# Patient Record
Sex: Female | Born: 1955 | Race: Black or African American | Hispanic: No | Marital: Single | Smoking: Former smoker
Health system: Southern US, Community
[De-identification: ages and names within clinical notes are randomized; demographics above are authoritative.]

## PROBLEM LIST (undated history)

## (undated) DIAGNOSIS — M1710 Unilateral primary osteoarthritis, unspecified knee: Secondary | ICD-10-CM

## (undated) DIAGNOSIS — J690 Pneumonitis due to inhalation of food and vomit: Secondary | ICD-10-CM

## (undated) DIAGNOSIS — IMO0002 Reserved for concepts with insufficient information to code with codable children: Secondary | ICD-10-CM

## (undated) DIAGNOSIS — I10 Essential (primary) hypertension: Secondary | ICD-10-CM

## (undated) DIAGNOSIS — J45909 Unspecified asthma, uncomplicated: Secondary | ICD-10-CM

## (undated) HISTORY — DX: Reserved for concepts with insufficient information to code with codable children: IMO0002

## (undated) HISTORY — PX: TONSILLECTOMY: SUR1361

## (undated) HISTORY — DX: Unspecified asthma, uncomplicated: J45.909

## (undated) HISTORY — DX: Pneumonitis due to inhalation of food and vomit: J69.0

## (undated) HISTORY — DX: Essential (primary) hypertension: I10

## (undated) HISTORY — PX: PROSTATECTOMY: SHX69

## (undated) HISTORY — DX: Unilateral primary osteoarthritis, unspecified knee: M17.10

## (undated) HISTORY — PX: JOINT REPLACEMENT: SHX530

---

## 1988-03-28 HISTORY — PX: OTHER SURGICAL HISTORY: SHX169

## 2010-04-28 ENCOUNTER — Encounter: Payer: Self-pay | Admitting: Internal Medicine

## 2010-04-28 ENCOUNTER — Ambulatory Visit (INDEPENDENT_AMBULATORY_CARE_PROVIDER_SITE_OTHER): Payer: BC Managed Care – PPO | Admitting: Internal Medicine

## 2010-04-28 DIAGNOSIS — J45909 Unspecified asthma, uncomplicated: Secondary | ICD-10-CM

## 2010-04-28 DIAGNOSIS — I10 Essential (primary) hypertension: Secondary | ICD-10-CM | POA: Insufficient documentation

## 2010-04-28 DIAGNOSIS — M1710 Unilateral primary osteoarthritis, unspecified knee: Secondary | ICD-10-CM | POA: Insufficient documentation

## 2010-04-28 DIAGNOSIS — J159 Unspecified bacterial pneumonia: Secondary | ICD-10-CM | POA: Insufficient documentation

## 2010-04-28 DIAGNOSIS — IMO0002 Reserved for concepts with insufficient information to code with codable children: Secondary | ICD-10-CM | POA: Insufficient documentation

## 2010-05-05 NOTE — Assessment & Plan Note (Signed)
Summary: NEW PT/BCBS/#/LB   Vital Signs:  Patient profile:   55 year old female Height:      73 inches Weight:      284 pounds BMI:     37.60 O2 Sat:      93 % on Room air Temp:     98.6 degrees F oral Pulse rate:   72 / minute BP sitting:   122 / 72  (left arm) Cuff size:   large  Vitals Entered By: Ami Bullins CMA (April 28, 2010 10:34 AM)  O2 Flow:  Room air  Vision Screening:      Vision Comments: Normal eye exam about 2 months ago  Vision Entered By: Bill Salinas CMA (April 28, 2010 10:44 AM)   Primary Care Provider:  Jacques Navy MD   History of Present Illness: Patient presents to establish for continuity care. She does have a miniscus tear left knee by MRI. She does have pain in the right knee as well.  By her report she has been researching various treatements including arthroplasty, synvsc injections, etc. She is generally feeling well otherwise.  Last gyn exam - 2 years; last mammogram 2+ years ago. No colonoscopy  Preventive Screening-Counseling & Management  Alcohol-Tobacco     Alcohol drinks/day: <1     Alcohol type: beer, wine     Smoking Status: quit     Year Quit: 1980s  Caffeine-Diet-Exercise     Caffeine use/day: on occasions     Does Patient Exercise: yes     Type of exercise: walking     Times/week: 5  Hep-HIV-STD-Contraception     Dental Visit-last 6 months no     Sun Exposure-Excessive: no  Safety-Violence-Falls     Seat Belt Use: yes     Helmet Use: n/a     Firearms in the Home: firearms in the home     Smoke Detectors: yes     Violence in the Home: no risk noted     Sexual Abuse: no     Fall Risk: low fall risk  Current Medications (verified): 1)  Foradil Aerolizer 12 Mcg Caps (Formoterol Fumarate) 2)  Ventolin Hfa 108 (90 Base) Mcg/act Aers (Albuterol Sulfate) 3)  Carvedilol 25 Mg Tabs (Carvedilol) .... Two Times A Day 4)  Aspirin 81 Mg Tbec (Aspirin) .Marland Kitchen.. 1 Tablet Daily 5)  Losartan Potassium 100 Mg Tabs (Losartan  Potassium) .Marland Kitchen.. 1 Tablet Once Daily 6)  Ibuprofen 800 Mg Tabs (Ibuprofen) .... As Needed 7)  Fish Oil 1000 Mg Caps (Omega-3 Fatty Acids) .Marland Kitchen.. 1 Once Daily 8)  Vitamin B-12 100 Mcg Tabs (Cyanocobalamin) .Marland Kitchen.. 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  The patient denies anorexia, fever, weight gain, vision loss, decreased hearing, hoarseness, chest pain, syncope, dyspnea on exertion, peripheral edema, prolonged cough, headaches, abdominal pain, melena, hematochezia, incontinence, muscle weakness, transient blindness, difficulty walking, unusual weight change, abnormal bleeding, enlarged lymph nodes, and angioedema.         planned loss of weight 30lbs over last 8 months.  Physical Exam  General:  Overweight AA woman in no distress Head:  normocephalic, atraumatic, and no abnormalities observed.   Eyes:  vision grossly intact, pupils equal, and pupils round. Lens OD with minimal opacity lower field. Fundi normal bilaterally without AV nicking, exudates. Nl disc margins  Ears:  External ear exam shows no significant lesions or deformities.  Otoscopic examination  reveals clear canals, tympanic membranes are intact bilaterally without bulging, retraction, inflammation or discharge. Hearing is grossly normal bilaterally. Nose:  no external deformity and no external erythema.   Mouth:  missing several molars,premolars, staining noted. No obvious gingevitis. Posterior pharynx clear. Neck:  supple, no masses, no thyromegaly, and no carotid bruits.   Chest Wall:  no deformities.   Breasts:  deferred Lungs:  normal respiratory effort, normal breath sounds, no crackles, and no wheezes.   Heart:  normal rate, regular rhythm, no murmur, and no JVD.   Abdomen:  soft, non-tender, and normal bowel sounds.   Genitalia:  deferred Msk:  normal ROM, no joint swelling, no joint warmth, no redness over joints, and no joint instability.   Pulses:  2+ radial Extremities:  No clubbing, cyanosis, edema, or deformity noted with normal full range of motion of all joints.   Neurologic:  alert & oriented X3, cranial nerves II-XII intact, gait normal, and DTRs symmetrical and normal.   Skin:  turgor normal, color normal, and no suspicious lesions.   Cervical Nodes:  no anterior cervical adenopathy and no posterior cervical adenopathy.   Psych:  Oriented X3, memory intact for recent and remote, normally interactive, and good eye contact.     Impression & Recommendations:  Problem # 1:  UNSPECIFIED ESSENTIAL HYPERTENSION (ICD-401.9)  Her updated medication list for this problem includes:    Carvedilol 25 Mg Tabs (Carvedilol) .Marland Kitchen..Marland Kitchen Two times a day    Losartan Potassium 100 Mg Tabs (Losartan potassium) .Marland Kitchen... 1 tablet once daily  BP today: 122/72  Good control on present medications. No lab available.  Plan - continue present meds  Problem # 2:  OSTEOARTHRITIS, KNEES, BILATERAL (ICD-715.96) Mild enlargement of left knee, no definite effusion. Patient reports that she has a click and that her knee does give way. Right knee with discomfort  Plan - continue with NSAIDs as  needed           May benefit from hyaluronidase injections.           With knee giving way may benefit from arthroscopic repair of patient reported torn meniscus.  Her updated medication list for this problem includes:    Aspirin 81 Mg Tbec (Aspirin) .Marland Kitchen... 1 tablet daily    Ibuprofen 800 Mg Tabs (Ibuprofen) .Marland Kitchen... As needed  Problem # 3:  ASTHMA (ICD-493.90) Patient with a long h/o asthma doing well on long acting bronchodilator and accolate with need for breakthrough bronchodilator less than once a week.   Plan - continue present regimen           Full PFTs in the future to establish baseline function.  Her updated medication list for this problem includes:    Foradil Aerolizer 12  Mcg Caps (Formoterol fumarate)    Ventolin Hfa 108 (90 Base) Mcg/act Aers (Albuterol sulfate)  Problem # 4:  Preventive Health Care (ICD-V70.0) By history she seems stable. Limited exam OK. Ms. Bracken will obtain labs done in the past 12 months from the [place]. She is due for breast exam, mammogram and pelvic with PAP. She is informed that these services are available here except for mammography. She will obtain records of last colon exam.   Ms. Lookabaugh is working on weight reduction by smart food choices, and reduced calorie intake. She has lost 30lbs in the last year. She is encouraged to continue on this Program.  She is oriented to the practice and hours of operation. She will return in 4-6 weeks for follow-up and completion of any wellness services that are due.   Complete Medication List: 1)  Foradil Aerolizer 12 Mcg Caps (Formoterol fumarate) 2)  Ventolin Hfa 108 (90 Base) Mcg/act Aers (Albuterol sulfate) 3)  Carvedilol 25 Mg Tabs (Carvedilol) .... Two times a day 4)  Aspirin 81 Mg Tbec (Aspirin) .Marland Kitchen.. 1 tablet daily 5)  Losartan Potassium 100 Mg Tabs (Losartan potassium) .Marland Kitchen.. 1 tablet once daily 6)  Ibuprofen 800 Mg Tabs (Ibuprofen) .... As needed 7)  Fish Oil 1000 Mg Caps (Omega-3 fatty acids) .Marland Kitchen.. 1 once  daily 8)  Vitamin B-12 100 Mcg Tabs (Cyanocobalamin) .Marland Kitchen.. 1 tablet once daily   Orders Added: 1)  New Patient Level III [99203]   Immunization History:  Influenza Immunization History:    Influenza:  historical (12/30/2009)   Immunization History:  Influenza Immunization History:    Influenza:  Historical (12/30/2009)

## 2010-05-27 ENCOUNTER — Ambulatory Visit: Payer: BC Managed Care – PPO | Admitting: Internal Medicine

## 2010-05-28 ENCOUNTER — Telehealth: Payer: Self-pay | Admitting: Internal Medicine

## 2010-05-31 ENCOUNTER — Encounter: Payer: Self-pay | Admitting: Internal Medicine

## 2010-05-31 ENCOUNTER — Ambulatory Visit: Payer: BC Managed Care – PPO | Admitting: Internal Medicine

## 2010-06-02 ENCOUNTER — Ambulatory Visit (INDEPENDENT_AMBULATORY_CARE_PROVIDER_SITE_OTHER): Payer: BC Managed Care – PPO | Admitting: Internal Medicine

## 2010-06-02 ENCOUNTER — Other Ambulatory Visit (HOSPITAL_COMMUNITY)
Admission: RE | Admit: 2010-06-02 | Discharge: 2010-06-02 | Disposition: A | Discharge status: Home / Self Care | Payer: BC Managed Care – PPO | Source: Ambulatory Visit | Attending: Internal Medicine | Admitting: Internal Medicine

## 2010-06-02 ENCOUNTER — Encounter: Payer: Self-pay | Admitting: Internal Medicine

## 2010-06-02 ENCOUNTER — Other Ambulatory Visit: Payer: Self-pay | Admitting: Internal Medicine

## 2010-06-02 DIAGNOSIS — Z01419 Encounter for gynecological examination (general) (routine) without abnormal findings: Secondary | ICD-10-CM | POA: Insufficient documentation

## 2010-06-02 DIAGNOSIS — Z0000 Encounter for general adult medical examination without abnormal findings: Secondary | ICD-10-CM

## 2010-06-03 NOTE — Progress Notes (Signed)
Summary: refill  Phone Note Call from Patient Call back at Home Phone [phone]   Caller: Patient Reason for Call: Refill Medication Summary of Call: request refill on hydrocodone and hydrochlorothiazide, [facility] [address], also request handicapped sticker filled out, given to [facility] Initial call taken by: Migdalia Dk,  May 28, 2010 2:18 PM  Follow-up for Phone Call        Please Advise refill on Hydrocodone Follow-up by: Ami Bullins CMA,  May 28, 2010 3:48 PM  Additional Follow-up for Phone Call Additional follow up Details #1::        called patient: she has been taking hydorcodone/APAP 5/500 two times a day as needed and HCTZ 25 once daily and forgot to tell us. Added to med list and called in refills Additional Follow-up by: Jacques Navy MD,  May 28, 2010 6:22 PM    New/Updated Medications: NORCO 5-325 MG TABS (HYDROCODONE-ACETAMINOPHEN) 1 by mouth two times a day as needed HYDROCHLOROTHIAZIDE 25 MG TABS (HYDROCHLOROTHIAZIDE) 1po once daily Prescriptions: HYDROCHLOROTHIAZIDE 25 MG TABS (HYDROCHLOROTHIAZIDE) 1po once daily  #30 x 12   Entered and Authorized by:   Jacques Navy MD   Signed by:   Jacques Navy MD on 05/28/2010   Method used:   Telephoned to ...       [facility]  [address] [phone][facility] (retail)       [address]       [place], [place]  [id number]       Ph: [phone] or [phone]       Fax: [phone]   RxID:   [id number] NORCO 5-325 MG TABS (HYDROCODONE-ACETAMINOPHEN) 1 by mouth two times a day as needed  #60 x 2   Entered and Authorized by:   Jacques Navy MD   Signed by:   Jacques Navy MD on 05/28/2010   Method used:   Telephoned to ...       [facility]  [address] [phone]* (retail)       [address]       [place], [place]  [id number]       Ph: [phone] or [phone]       Fax: [phone]   RxID:   [id number]

## 2010-06-06 ENCOUNTER — Encounter: Payer: Self-pay | Admitting: Internal Medicine

## 2010-06-08 NOTE — Assessment & Plan Note (Signed)
Summary: 1 mos f/u --rs'd from bumped list   Vital Signs:  Patient profile:   55 year old female Height:      73 inches Weight:      289 pounds BMI:     38.27 Temp:     98.5 degrees F oral Pulse rate:   88 / minute Pulse rhythm:   regular Resp:     16 per minute BP sitting:   110 / 78  (left arm) Cuff size:   large  Vitals Entered By: Lanier Prude, CMA(AAMA) (June 02, 2010 11:18 AM) CC: f/u Is Patient Diabetic? No   Primary Care Provider:  Jacques Navy MD  CC:  f/u.  History of Present Illness: Gabriella Nguyen returns at [facility] request for new patinet follow-up.   CC: she is still having knee trouble. she did have a MRi left knee at [facility] '07 - torn meniscus - she declined arthroscopic surgery since she did better with glucosamine. She is interested in a local orthopedist - Dr. Lequita Halt.   Patient wants to loose weight and come off of medication. discussed 3 principles of weight mgt: smart food choice, portion size control  and exercise.   target too loose 30 lbs, goal 1 lb loss per month.   Per last note: for PAP and breast today.  Current Medications (verified): 1)  Foradil Aerolizer 12 Mcg Caps (Formoterol Fumarate) 2)  Ventolin Hfa 108 (90 Base) Mcg/act Aers (Albuterol Sulfate) 3)  Carvedilol 25 Mg Tabs (Carvedilol) .... Two Times A Day 4)  Aspirin 81 Mg Tbec (Aspirin) .Marland Kitchen.. 1 Tablet Daily 5)  Losartan Potassium 100 Mg Tabs (Losartan Potassium) .Marland Kitchen.. 1 Tablet Once Daily 6)  Ibuprofen 800 Mg Tabs (Ibuprofen) .... As Needed 7)  Fish Oil 1000 Mg Caps (Omega-3 Fatty Acids) .Marland Kitchen.. 1 Once Daily 8)  Vitamin B-12 100 Mcg Tabs (Cyanocobalamin) .Marland Kitchen.. 1 Tablet Once Daily 9)  Norco 5-325 Mg Tabs (Hydrocodone-Acetaminophen) .Marland Kitchen.. 1 By Mouth Two Times A Day As Needed 10)  Hydrochlorothiazide 25 Mg Tabs (Hydrochlorothiazide) .Marland Kitchen.. 1po Once Daily  Allergies (verified): No Known Drug Allergies PMH-FH-SH reviewed-no changes except otherwise noted  Review of Systems  The patient denies  anorexia, fever, weight loss, weight gain, decreased hearing, chest pain, syncope, dyspnea on exertion, abdominal pain, severe indigestion/heartburn, muscle weakness, difficulty walking, depression, abnormal bleeding, and angioedema.    Physical Exam  General:  overweight AA female in no distress Head:  normocephalic and atraumatic.   Breasts:  No mass, nodules, thickening, tenderness, bulging, retraction, inflamation, nipple discharge or skin changes noted.  There are minor fibrocystic changes Genitalia:  Pelvic Exam:        External: normal female genitalia without lesions or masses        Vagina: normal without lesions or masses        Cervix: normal without lesions or mass                      Pap smear: performed  Patient with scant white d/c c/w yeast.    Impression & Recommendations:  Problem # 1:  OSTEOARTHRITIS, KNEES, BILATERAL (ICD-715.96) Patient with on -going knee   Plan - refer to Dr. Lequita Halt  Her updated medication list for this problem includes:    Aspirin 81 Mg Tbec (Aspirin) .Marland Kitchen... 1 tablet daily    Ibuprofen 800 Mg Tabs (Ibuprofen) .Marland Kitchen... As needed    Norco 5-325 Mg Tabs (Hydrocodone-acetaminophen) .Marland Kitchen... 1 by mouth two times a day as  needed  Orders: Orthopedic Surgeon Referral (Ortho Surgeon)  Problem # 2:  UNSPECIFIED ESSENTIAL HYPERTENSION (ICD-401.9) Well controlled.  Plan - weight management and drug holiday when down 30 lbs. (259)  Her updated medication list for this problem includes:    Carvedilol 25 Mg Tabs (Carvedilol) .Marland Kitchen..Marland Kitchen Two times a day    Losartan Potassium 100 Mg Tabs (Losartan potassium) .Marland Kitchen... 1 tablet once daily    Hydrochlorothiazide 25 Mg Tabs (Hydrochlorothiazide) .Marland Kitchen... 1po once daily  Problem # 3:  Preventive Health Care (ICD-V70.0) Pelvic with PAP done today. Will schedule for mammogram. Breast exam with minor fibrocystic change. Discussed weight management.  Problem # 4:  ASTHMA (ICD-493.90) Doing well.   Plan - for PFTs  Her  updated medication list for this problem includes:    Foradil Aerolizer 12 Mcg Caps (Formoterol fumarate)    Ventolin Hfa 108 (90 Base) Mcg/act Aers (Albuterol sulfate)  Orders: Pulmonary Referral (Pulmonary)  Complete Medication List: 1)  Foradil Aerolizer 12 Mcg Caps (Formoterol fumarate) 2)  Ventolin Hfa 108 (90 Base) Mcg/act Aers (Albuterol sulfate) 3)  Carvedilol 25 Mg Tabs (Carvedilol) .... Two times a day 4)  Aspirin 81 Mg Tbec (Aspirin) .Marland Kitchen.. 1 tablet daily 5)  Losartan Potassium 100 Mg Tabs (Losartan potassium) .Marland Kitchen.. 1 tablet once daily 6)  Ibuprofen 800 Mg Tabs (Ibuprofen) .... As needed 7)  Fish Oil 1000 Mg Caps (Omega-3 fatty acids) .Marland Kitchen.. 1 once daily 8)  Vitamin B-12 100 Mcg Tabs (Cyanocobalamin) .Marland Kitchen.. 1 tablet once daily 9)  Norco 5-325 Mg Tabs (Hydrocodone-acetaminophen) .Marland Kitchen.. 1 by mouth two times a day as needed 10)  Hydrochlorothiazide 25 Mg Tabs (Hydrochlorothiazide) .Marland Kitchen.. 1po once daily 11)  Fluconazole 150 Mg Tabs (Fluconazole) .Marland Kitchen.. 1 by mouth  x 1 for vaginitis  Other Orders: Radiology Referral (Radiology) Prescriptions: FLUCONAZOLE 150 MG TABS (FLUCONAZOLE) 1 by mouth  x 1 for vaginitis  #1 x 1   Entered and Authorized by:   Gabriella Navy MD   Signed by:   Gabriella Navy MD on 06/02/2010   Method used:   [facility]        [facility]  [address] [phone]* (retail)       [address]       [place], [place]  [id number]       Ph: [phone] or [phone]       Fax: [phone]   RxID:   [id number]    Orders Added: 1)  Orthopedic Surgeon Referral Gaylord Shih Surgeon] 2)  Pulmonary Referral [Pulmonary] 3)  Radiology Referral [Radiology] 4)  Est. Patient 40-64 years [phone]

## 2010-06-08 NOTE — Letter (Signed)
Summary: [employer] / [place] [employer] / [place] [employer]   Imported By: Lennie Odor 06/02/2010 10:50:42  _____________________________________________________________________  External Attachment:    Type:   Image     Comment:   External Document

## 2010-06-10 ENCOUNTER — Encounter: Payer: Self-pay | Admitting: Internal Medicine

## 2010-06-10 ENCOUNTER — Encounter (INDEPENDENT_AMBULATORY_CARE_PROVIDER_SITE_OTHER): Payer: BC Managed Care – PPO

## 2010-06-10 DIAGNOSIS — J45909 Unspecified asthma, uncomplicated: Secondary | ICD-10-CM

## 2010-06-10 LAB — PULMONARY FUNCTION TEST

## 2010-06-15 NOTE — Letter (Signed)
   [facility] [address] [place], [place]  [address] Phone: [phone]      June 07, 2010   Gabriella Nguyen [address] [place], [place] [address]  RE:  LAB RESULTS  Dear  [name],  The following is an interpretation of your most recent lab tests.  Please take note of any instructions provided or changes to medications that have resulted from your lab work.  Pap Smear: normal   Sincerely Yours,    Jacques Navy MD

## 2010-06-15 NOTE — Assessment & Plan Note (Signed)
Summary: pft charges   Allergies: No Known Drug Allergies   Other Orders: Carbon Monoxide diffusing w/capacity (94729) Lung Volumes/Gas dilution or washout (94727) Spirometry (Pre & Post) (94060) 

## 2010-07-21 ENCOUNTER — Telehealth: Payer: Self-pay | Admitting: Internal Medicine

## 2010-07-21 NOTE — Telephone Encounter (Signed)
Regret this wasn't brought to my attn this AM. If she calls she can take otc bonine - mild antihistamine will help with her nerves going over the long bridge.

## 2010-07-21 NOTE — Telephone Encounter (Signed)
Left mess to call office back.  What about pain patch req for her knees?

## 2010-07-21 NOTE — Telephone Encounter (Signed)
Pt requests something to calm her down,leaving for [facility], the bridge she will be going over is very very long and she is afraid of bridges. Pt leaving this am and is aware of short notice. Also waiting for orthopedic (appt is set up). Pt would also like a pain patch for her knees.

## 2010-07-22 NOTE — Telephone Encounter (Signed)
Should call ortho for treatment of choice, i.e. volatren gel, etc.

## 2010-07-22 NOTE — Telephone Encounter (Signed)
Patient informed - She has not gotten in with ortho yet. Currently taking ibuprofen (w/o stomach upset) so I advised she try 2 aleve bid instead of Advil and watch for GI irritation. She will call back w/any further problems.

## 2010-08-25 NOTE — Telephone Encounter (Signed)
Done by CMA

## 2010-09-02 ENCOUNTER — Other Ambulatory Visit: Payer: Self-pay | Admitting: *Deleted

## 2010-09-02 MED ORDER — ALBUTEROL 90 MCG/ACT IN AERS: 2.0000 | INHALATION_SPRAY | Freq: Four times a day (QID) | RESPIRATORY_TRACT | Status: DC | PRN

## 2010-09-02 NOTE — Progress Notes (Signed)
Rf requested, done, pt aware

## 2010-09-10 ENCOUNTER — Encounter: Payer: Self-pay | Admitting: Internal Medicine

## 2010-11-08 ENCOUNTER — Telehealth: Payer: Self-pay | Admitting: *Deleted

## 2010-11-08 NOTE — Telephone Encounter (Signed)
Patient left VM to transfer an rx, unsure med or pharm.

## 2010-11-19 ENCOUNTER — Telehealth: Payer: Self-pay | Admitting: *Deleted

## 2010-11-19 MED ORDER — FORMOTEROL FUMARATE 12 MCG IN CAPS: 12.0000 ug | ORAL_CAPSULE | Freq: Two times a day (BID) | RESPIRATORY_TRACT | Status: DC

## 2010-11-19 MED ORDER — LOSARTAN POTASSIUM 100 MG PO TABS: 100.0000 mg | ORAL_TABLET | Freq: Every day | ORAL | Status: DC

## 2010-11-19 MED ORDER — CARVEDILOL 25 MG PO TABS: 25.0000 mg | ORAL_TABLET | Freq: Two times a day (BID) | ORAL | Status: DC

## 2010-11-19 NOTE — Telephone Encounter (Signed)
Needs RFs, Done

## 2011-01-31 ENCOUNTER — Other Ambulatory Visit: Payer: Self-pay | Admitting: *Deleted

## 2011-01-31 NOTE — Telephone Encounter (Signed)
Pt states she was seen by her dentist 2 weeks ago for tooth abscess and was given amoxicillin.  Pt now has yeast infection and wants to know if we can call in Diflucan for her.  Pt has had in the past. Also pt needs refill on generic Accolate (zafirlukast).  We have never filled this for patient.  She is transferring prescriptions from the [place] and needs new RX. RC to pt to verify dose.  Message left to return call.

## 2011-02-01 MED ORDER — FLUCONAZOLE 150 MG PO TABS: 150.0000 mg | ORAL_TABLET | Freq: Once | ORAL | Status: AC

## 2011-02-01 MED ORDER — ZAFIRLUKAST 20 MG PO TABS: 20.0000 mg | ORAL_TABLET | Freq: Two times a day (BID) | ORAL | Status: DC

## 2011-02-01 NOTE — Telephone Encounter (Signed)
Rx Done. Patient Informed.

## 2011-02-01 NOTE — Telephone Encounter (Signed)
It is ok for diflucan 150mg  x 1 dose. OK to refill accolate

## 2011-02-01 NOTE — Telephone Encounter (Signed)
See note below from 11.05.12

## 2011-04-28 ENCOUNTER — Telehealth: Payer: Self-pay | Admitting: *Deleted

## 2011-04-28 NOTE — Telephone Encounter (Signed)
Pt says every now and then have trouble breathing.  Will use albuterol inhaler but sometimes do not work right away.--Ph# [phone]

## 2011-04-28 NOTE — Telephone Encounter (Signed)
Last OV March '12.  Albuterol is supposed to work quickly to relieve acute wheezing.  She should be taking foradil inhaler as directed every day to keep asthma, and wheezing, under control.

## 2011-04-28 NOTE — Telephone Encounter (Signed)
Pt requested callback regarding asthma medication. Left message for pt to callback office.

## 2011-04-29 ENCOUNTER — Ambulatory Visit (INDEPENDENT_AMBULATORY_CARE_PROVIDER_SITE_OTHER): Payer: BC Managed Care – PPO | Admitting: Internal Medicine

## 2011-04-29 ENCOUNTER — Encounter: Payer: Self-pay | Admitting: Internal Medicine

## 2011-04-29 VITALS — BP 120/72 | HR 69 | Temp 97.5°F | Wt 284.8 lb

## 2011-04-29 DIAGNOSIS — J45901 Unspecified asthma with (acute) exacerbation: Secondary | ICD-10-CM

## 2011-04-29 MED ORDER — PREDNISONE (PAK) 10 MG PO TABS: 10.0000 mg | ORAL_TABLET | ORAL | Status: AC

## 2011-04-29 NOTE — Telephone Encounter (Signed)
Called patient who advised that she was seen by Dr Felicity Coyer today

## 2011-04-29 NOTE — Progress Notes (Signed)
  Subjective:    Patient ID: Gabriella Nguyen, female    DOB: 15-Apr-1955, 55 y.o.   MRN: [id number]  HPI  complains of shortness of breath Onset 1.5 week ago associated with wheezing and chest tightness, dry cough Precipitated by URI but reports "head cold symptoms are gone" Ran out of fordil inhaler 2 days ago - symptoms worse and not controlled with rescue inhaler alone PMH significant for asthma  Past Medical History  Diagnosis Date  . ASTHMA   . OSTEOARTHRITIS, KNEES, BILATERAL   . Unspecified essential hypertension     Review of Systems  Constitutional: Negative for fever and fatigue.  Respiratory: Positive for chest tightness, shortness of breath and wheezing.   Cardiovascular: Negative for chest pain, palpitations and leg swelling.       Objective:   Physical Exam BP 120/72  Pulse 69  Temp(Src) 97.5 F (36.4 C) (Oral)  Wt 284 lb 12.8 oz (129.184 kg)  SpO2 93% Wt Readings from Last 3 Encounters:  04/29/11 284 lb 12.8 oz (129.184 kg)  06/02/10 289 lb (131.09 kg)  04/28/10 284 lb (128.822 kg)   Constitutional: She appears well-developed and well-nourished. No distress but mild increase WOB at rest before neb.  Neck: Normal range of motion. Neck supple. No JVD present. No thyromegaly present.  Cardiovascular: Normal rate, regular rhythm and normal heart sounds.  No murmur heard. No BLE edema. Pulmonary/Chest: Effort normal at rest - breath sounds with B exp wheezes, no crackle or rhonchi.  Psychiatric: She has a normal mood and affect. Her behavior is normal. Judgment and thought content normal.   No results found for this basename: WBC, HGB, HCT, PLT, GLUCOSE, CHOL, TRIG, HDL, LDLDIRECT, LDLCALC, ALT, AST, NA, K, CL, CREATININE, BUN, CO2, TSH, PSA, INR, GLUF, HGBA1C, MICROALBUR       Assessment & Plan:  Acute asthma exac - precipitated by recent URI (resolved) and exac by no inhaled steroids -  Tx: Alb neb now pred pak x 6 days Hold antibiotics refill long  acting B agonist and consider steroid inhaler Continue rescue inhaler prn

## 2011-04-29 NOTE — Patient Instructions (Signed)
It was good to see you today. Prednisone taper x 6 dasy for asthma symptoms - Your prescription(s) have been submitted to your pharmacy. Please take as directed and contact our office if you believe you are having problem(s) with the medication(s). If you develop worsening symptoms or fever, call and we can reconsider antibiotics, but it does not appear necessary to use antibiotics at this time. Use daily steroid inhaler everyday =- also additional  Albuterol rescue inhaler if symptoms uncontrolled

## 2011-05-02 ENCOUNTER — Telehealth: Payer: Self-pay

## 2011-05-02 NOTE — Telephone Encounter (Signed)
[facility] Triage Call Report Triage Record Num: [id number] Operator: Fernand Parkins Patient Name: Gabriella Nguyen Call Date & Time: 04/29/2011 6:53:57PM Patient Phone: [phone] PCP: Rene Paci Patient Gender: Female PCP Fax : [phone] Patient DOB: 08-17-1955 [facility] Name: [facility] Reason for Call: Caller: Gabriella Nguyen/Patient; PCP: Rene Paci; CB#: [phone]; Call Reason: ; Sx Onset: ; Sx Notes: ; Afebrile; Wt: ; Guideline Used: ; Disp:; Appt Scheduled?: LMP 6-7 yrs ago. Afebrile. Gabriella Nguyen/pt calling regarding needs to clarify how she needs to take the Prednisone? Was told by the pcp that she could just take all the pills in one dose for each day and step down on the dose everyday. Was seen in pcp office today and started on Prednisone. S/w on call MD Berniece Andreas, was advised could take all pills at once for each days dose. Advised pt if needs any further assistance to call the pcp office back. Protocol(s) Used: Medication Questions - Adult Recommended Outcome per Protocol: Provided Health Information Override Outcome if Used in Protocol: Call Provider Immediately RN Reason for Override Outcome: Office Is Closed. Reason for Outcome: Caller has medication question(s) that was answered with available resources Care Advice: ~ 04/29/2011 7:12:24PM Page 1 of 1 [email or web address]

## 2011-05-18 ENCOUNTER — Other Ambulatory Visit: Payer: Self-pay | Admitting: Internal Medicine

## 2011-05-25 ENCOUNTER — Other Ambulatory Visit: Payer: Self-pay | Admitting: Internal Medicine

## 2011-06-20 ENCOUNTER — Other Ambulatory Visit: Payer: Self-pay | Admitting: Internal Medicine

## 2011-07-08 ENCOUNTER — Other Ambulatory Visit: Payer: Self-pay | Admitting: Internal Medicine

## 2011-07-08 NOTE — Telephone Encounter (Signed)
Done

## 2011-07-22 ENCOUNTER — Encounter: Payer: Self-pay | Admitting: Endocrinology

## 2011-07-22 ENCOUNTER — Ambulatory Visit (INDEPENDENT_AMBULATORY_CARE_PROVIDER_SITE_OTHER): Payer: BC Managed Care – PPO | Admitting: Endocrinology

## 2011-07-22 VITALS — BP 112/76 | HR 108 | Temp 101.2°F | Ht 73.0 in | Wt 272.0 lb

## 2011-07-22 DIAGNOSIS — K529 Noninfective gastroenteritis and colitis, unspecified: Secondary | ICD-10-CM

## 2011-07-22 DIAGNOSIS — K5289 Other specified noninfective gastroenteritis and colitis: Secondary | ICD-10-CM

## 2011-07-22 MED ORDER — ONDANSETRON HCL 4 MG PO TABS: 4.0000 mg | ORAL_TABLET | Freq: Three times a day (TID) | ORAL | Status: AC | PRN

## 2011-07-22 NOTE — Progress Notes (Signed)
  Subjective:    Patient ID: Gabriella Nguyen, female    DOB: [birth year], 56 y.o.   MRN: [id number]  HPI Pt states 2 days of moderate myalgias throughout the body, and assoc n/v. Past Medical History  Diagnosis Date  . ASTHMA   . OSTEOARTHRITIS, KNEES, BILATERAL   . Unspecified essential hypertension     No past surgical history on file.  History   Social History  . Marital Status: Single    Spouse Name: N/A    Number of Children: N/A  . Years of Education: N/A   Occupational History  . Not on file.   Social History Main Topics  . Smoking status: Former Games developer  . Smokeless tobacco: Not on file  . Alcohol Use: Not on file  . Drug Use: Not on file  . Sexually Active: Not on file   Other Topics Concern  . Not on file   Social History Narrative  . No narrative on file    Current Outpatient Prescriptions on File Prior to Visit  Medication Sig Dispense Refill  . albuterol (PROVENTIL,VENTOLIN) 90 MCG/ACT inhaler Inhale 2 puffs into the lungs every 6 (six) hours as needed for wheezing.  17 g  5  . carvedilol (COREG) 25 MG tablet TAKE 1 TABLET (25 MG TOTAL) BY MOUTH 2 (TWO) TIMES DAILY.  180 tablet  1  . FORADIL AEROLIZER 12 MCG capsule for inhaler PLACE 1 CAPSULE (12 MCG TOTAL) INTO INHALER AND INHALE 2 (TWO) TIMES DAILY.  1 each  0  . hydrochlorothiazide (HYDRODIURIL) 25 MG tablet TAKE 1 TABLET BY MOUTH DAILY  30 tablet  1  . losartan (COZAAR) 100 MG tablet TAKE 1 TABLET (100 MG TOTAL) BY MOUTH DAILY.  90 tablet  1  . zafirlukast (ACCOLATE) 20 MG tablet TAKE 1 TABLET BY MOUTH TWICE DAILY.  60 tablet  5   No Known Allergies  No family history on file.  BP 112/76  Pulse 108  Temp(Src) 101.2 F (38.4 C) (Oral)  Ht 6\' 1"  (1.854 m)  Wt 272 lb (123.378 kg)  BMI 35.89 kg/m2  SpO2 91%  Review of Systems She has fever, but no diarrhea.    Objective:   Physical Exam VITAL SIGNS:  See vs page GENERAL: no distress ABDOMEN: abdomen is soft, nontender.  no  hepatosplenomegaly.  not distended.  no hernia    Assessment & Plan:  Gastroenteritis, new HTN.  overcontrolled due to acute illness

## 2011-07-22 NOTE — Patient Instructions (Addendum)
i have sent a prescription to your pharmacy, for an anti-nausea pill. Skip the HCTZ and losartan until you feel better.   Take tylenol, and drink plenty of fluids.   I hope you feel better soon.  If you don't feel better by next week, please call back.   Viral Gastroenteritis Viral gastroenteritis is also called stomach flu. This illness is caused by a certain type of germ (virus). It can cause sudden watery poop (diarrhea) and throwing up (vomiting). This can cause you to lose body fluids (dehydration). This illness usually lasts for 3 to 8 days. It usually goes away on its own. HOME CARE    Drink enough fluids to keep your pee (urine) clear or pale yellow. Drink small amounts of fluids often.   Ask your doctor how to replace body fluid losses (rehydration).   Avoid:   Foods high in sugar.   Alcohol.   Bubbly (carbonated) drinks.   Tobacco.   Juice.   Caffeine drinks.   Very hot or cold fluids.   Fatty, greasy foods.   Eating too much at one time.   Dairy products until 24 to 48 hours after your watery poop stops.   You may eat foods with active cultures (probiotics). They can be found in some yogurts and supplements.   Wash your hands well to avoid spreading the illness.   Only take medicines as told by your doctor. Do not take medicines for watery poop (antidiarrheals).   Ask your doctor if you should keep taking your regular medicines.   Keep all doctor visits as told.  GET HELP RIGHT AWAY IF:    You cannot keep fluids down.   You do not pee at least once every 6 to 8 hours.   You are short of breath.   You see blood in your poop or throw up. This may look like coffee grounds.   You have belly (abdominal) pain that gets worse or is just in one small spot (localized).   You keep throwing up or having watery poop.  MAKE SURE YOU:    Understand these instructions.   Will watch your condition.   Will get help right away if you are not doing well or get  worse.  Document Released: 08/31/2007 Document Revised: 03/03/2011 Document Reviewed: 12/29/2010 [facility] Patient Information 2012 [place], [place].

## 2011-07-25 ENCOUNTER — Emergency Department (HOSPITAL_COMMUNITY): Payer: BC Managed Care – PPO

## 2011-07-25 ENCOUNTER — Inpatient Hospital Stay (HOSPITAL_COMMUNITY)
Admission: EM | Admit: 2011-07-25 | Discharge: 2011-07-27 | DRG: 079 | Disposition: A | Discharge status: Home / Self Care | Payer: BC Managed Care – PPO | Attending: Internal Medicine | Admitting: Internal Medicine

## 2011-07-25 ENCOUNTER — Inpatient Hospital Stay (HOSPITAL_COMMUNITY): Payer: BC Managed Care – PPO

## 2011-07-25 ENCOUNTER — Encounter (HOSPITAL_COMMUNITY): Payer: Self-pay | Admitting: Emergency Medicine

## 2011-07-25 DIAGNOSIS — IMO0002 Reserved for concepts with insufficient information to code with codable children: Secondary | ICD-10-CM | POA: Diagnosis present

## 2011-07-25 DIAGNOSIS — R112 Nausea with vomiting, unspecified: Secondary | ICD-10-CM | POA: Diagnosis present

## 2011-07-25 DIAGNOSIS — Z87891 Personal history of nicotine dependence: Secondary | ICD-10-CM

## 2011-07-25 DIAGNOSIS — R6510 Systemic inflammatory response syndrome (SIRS) of non-infectious origin without acute organ dysfunction: Secondary | ICD-10-CM

## 2011-07-25 DIAGNOSIS — J449 Chronic obstructive pulmonary disease, unspecified: Secondary | ICD-10-CM | POA: Diagnosis present

## 2011-07-25 DIAGNOSIS — R748 Abnormal levels of other serum enzymes: Secondary | ICD-10-CM | POA: Diagnosis present

## 2011-07-25 DIAGNOSIS — I10 Essential (primary) hypertension: Secondary | ICD-10-CM | POA: Diagnosis present

## 2011-07-25 DIAGNOSIS — R5381 Other malaise: Secondary | ICD-10-CM | POA: Diagnosis present

## 2011-07-25 DIAGNOSIS — M1710 Unilateral primary osteoarthritis, unspecified knee: Secondary | ICD-10-CM | POA: Diagnosis present

## 2011-07-25 DIAGNOSIS — E876 Hypokalemia: Secondary | ICD-10-CM | POA: Diagnosis present

## 2011-07-25 DIAGNOSIS — K859 Acute pancreatitis without necrosis or infection, unspecified: Secondary | ICD-10-CM

## 2011-07-25 DIAGNOSIS — R109 Unspecified abdominal pain: Secondary | ICD-10-CM | POA: Diagnosis present

## 2011-07-25 DIAGNOSIS — J4489 Other specified chronic obstructive pulmonary disease: Secondary | ICD-10-CM | POA: Diagnosis present

## 2011-07-25 DIAGNOSIS — R5383 Other fatigue: Secondary | ICD-10-CM | POA: Diagnosis present

## 2011-07-25 DIAGNOSIS — J690 Pneumonitis due to inhalation of food and vomit: Principal | ICD-10-CM | POA: Diagnosis present

## 2011-07-25 DIAGNOSIS — R531 Weakness: Secondary | ICD-10-CM | POA: Diagnosis present

## 2011-07-25 DIAGNOSIS — N390 Urinary tract infection, site not specified: Secondary | ICD-10-CM | POA: Diagnosis present

## 2011-07-25 DIAGNOSIS — J189 Pneumonia, unspecified organism: Secondary | ICD-10-CM

## 2011-07-25 LAB — INFLUENZA PANEL BY PCR (TYPE A & B)
Influenza A By PCR: NEGATIVE
Influenza B By PCR: NEGATIVE

## 2011-07-25 LAB — COMPREHENSIVE METABOLIC PANEL
ALT: 9 U/L (ref 0–35)
Alkaline Phosphatase: 97 U/L (ref 39–117)
BUN: 16 mg/dL (ref 6–23)
CO2: 25 mEq/L (ref 19–32)
Chloride: 99 mEq/L (ref 96–112)
GFR calc Af Amer: >90 mL/min (ref >90)
GFR calc non Af Amer: >90 mL/min (ref >90)
Glucose, Bld: 160 mg/dL — ABNORMAL HIGH (ref 70–99)
Potassium: 3.1 mEq/L — ABNORMAL LOW (ref 3.5–5.1)
Sodium: 134 mEq/L — ABNORMAL LOW (ref 135–145)
Total Bilirubin: 0.7 mg/dL (ref 0.3–1.2)

## 2011-07-25 LAB — URINE MICROSCOPIC-ADD ON

## 2011-07-25 LAB — URINALYSIS, ROUTINE W REFLEX MICROSCOPIC
Glucose, UA: NEGATIVE mg/dL
Hgb urine dipstick: NEGATIVE
Hgb urine dipstick: NEGATIVE
Ketones, ur: 15 mg/dL — AB
Ketones, ur: 15 mg/dL — AB
Protein, ur: 100 mg/dL — AB
Protein, ur: 100 mg/dL — AB
Specific Gravity, Urine: 1.031 — ABNORMAL HIGH (ref 1.005–1.030)
Urobilinogen, UA: >8 mg/dL — ABNORMAL HIGH (ref 0.0–1.0)
Urobilinogen, UA: >8 mg/dL — ABNORMAL HIGH (ref 0.0–1.0)

## 2011-07-25 LAB — CBC
HCT: 39.7 % (ref 36.0–46.0)
HCT: 37.1 % (ref 36.0–46.0)
Hemoglobin: 13.7 g/dL (ref 12.0–15.0)
MCH: 29 pg (ref 26.0–34.0)
MCHC: 32.6 g/dL (ref 30.0–36.0)
MCV: 89 fL (ref 78.0–100.0)
RBC: 4.5 MIL/uL (ref 3.87–5.11)
RDW: 13.2 % (ref 11.5–15.5)

## 2011-07-25 LAB — LIPASE, BLOOD: Lipase: 268 U/L — ABNORMAL HIGH (ref 11–59)

## 2011-07-25 MED ORDER — ACETAMINOPHEN 325 MG PO TABS
650.0000 mg | ORAL_TABLET | ORAL | Status: DC | PRN
  Administered 2011-07-25: 650 mg via ORAL
  Filled 2011-07-25: qty 2

## 2011-07-25 MED ORDER — IBUPROFEN 800 MG PO TABS
800.0000 mg | ORAL_TABLET | Freq: Four times a day (QID) | ORAL | Status: DC | PRN
  Filled 2011-07-25 (×2): qty 1

## 2011-07-25 MED ORDER — ENOXAPARIN SODIUM 60 MG/0.6ML ~~LOC~~ SOLN
60.0000 mg | SUBCUTANEOUS | Status: DC
  Administered 2011-07-26: 60 mg via SUBCUTANEOUS
  Filled 2011-07-25 (×2): qty 0.6

## 2011-07-25 MED ORDER — MOXIFLOXACIN HCL IN NACL 400 MG/250ML IV SOLN
400.0000 mg | Freq: Once | INTRAVENOUS | Status: AC
  Administered 2011-07-25: 400 mg via INTRAVENOUS
  Filled 2011-07-25: qty 250

## 2011-07-25 MED ORDER — BIOTENE DRY MOUTH MT LIQD
15.0000 mL | Freq: Two times a day (BID) | OROMUCOSAL | Status: DC
  Administered 2011-07-25 – 2011-07-27 (×4): 15 mL via OROMUCOSAL

## 2011-07-25 MED ORDER — IOHEXOL 300 MG/ML  SOLN
80.0000 mL | Freq: Once | INTRAMUSCULAR | Status: AC | PRN
  Administered 2011-07-25: 100 mL via INTRAVENOUS

## 2011-07-25 MED ORDER — SALMETEROL XINAFOATE 50 MCG/DOSE IN AEPB
1.0000 | INHALATION_SPRAY | Freq: Two times a day (BID) | RESPIRATORY_TRACT | Status: DC
  Filled 2011-07-25: qty 0

## 2011-07-25 MED ORDER — MONTELUKAST SODIUM 10 MG PO TABS
10.0000 mg | ORAL_TABLET | Freq: Every day | ORAL | Status: DC
  Administered 2011-07-25 – 2011-07-26 (×2): 10 mg via ORAL
  Filled 2011-07-25 (×3): qty 1

## 2011-07-25 MED ORDER — ALBUTEROL SULFATE HFA 108 (90 BASE) MCG/ACT IN AERS
2.0000 | INHALATION_SPRAY | Freq: Four times a day (QID) | RESPIRATORY_TRACT | Status: DC | PRN
  Filled 2011-07-25: qty 6.7

## 2011-07-25 MED ORDER — PIPERACILLIN-TAZOBACTAM 3.375 G IVPB
3.3750 g | Freq: Three times a day (TID) | INTRAVENOUS | Status: DC
  Administered 2011-07-25 – 2011-07-27 (×6): 3.375 g via INTRAVENOUS
  Filled 2011-07-25 (×10): qty 50

## 2011-07-25 MED ORDER — PROMETHAZINE HCL 25 MG/ML IJ SOLN: 12.5000 mg | INTRAMUSCULAR | Status: DC | PRN

## 2011-07-25 MED ORDER — ONDANSETRON HCL 4 MG/2ML IJ SOLN: 4.0000 mg | Freq: Four times a day (QID) | INTRAMUSCULAR | Status: DC | PRN

## 2011-07-25 MED ORDER — ONDANSETRON HCL 4 MG/2ML IJ SOLN
4.0000 mg | Freq: Once | INTRAMUSCULAR | Status: AC
  Administered 2011-07-25: 4 mg via INTRAVENOUS
  Filled 2011-07-25: qty 2

## 2011-07-25 MED ORDER — POTASSIUM CHLORIDE CRYS ER 20 MEQ PO TBCR
40.0000 meq | EXTENDED_RELEASE_TABLET | Freq: Once | ORAL | Status: AC
  Administered 2011-07-25: 40 meq via ORAL
  Filled 2011-07-25: qty 2

## 2011-07-25 MED ORDER — ALBUTEROL 90 MCG/ACT IN AERS: 2.0000 | INHALATION_SPRAY | Freq: Four times a day (QID) | RESPIRATORY_TRACT | Status: DC | PRN

## 2011-07-25 MED ORDER — ENOXAPARIN SODIUM 40 MG/0.4ML ~~LOC~~ SOLN
40.0000 mg | SUBCUTANEOUS | Status: DC
  Administered 2011-07-25: 40 mg via SUBCUTANEOUS
  Filled 2011-07-25: qty 0.4

## 2011-07-25 MED ORDER — SODIUM CHLORIDE 0.9 % IV SOLN
INTRAVENOUS | Status: DC
  Administered 2011-07-25 – 2011-07-26 (×5): via INTRAVENOUS

## 2011-07-25 MED ORDER — FENTANYL CITRATE 0.05 MG/ML IJ SOLN
50.0000 ug | Freq: Once | INTRAMUSCULAR | Status: AC
  Administered 2011-07-25: 50 ug via INTRAVENOUS
  Filled 2011-07-25: qty 2

## 2011-07-25 MED ORDER — VANCOMYCIN HCL 1000 MG IV SOLR
2250.0000 mg | Freq: Once | INTRAVENOUS | Status: AC
  Administered 2011-07-25: 2250 mg via INTRAVENOUS
  Filled 2011-07-25: qty 2250

## 2011-07-25 MED ORDER — FLUTICASONE-SALMETEROL 250-50 MCG/DOSE IN AEPB
1.0000 | INHALATION_SPRAY | Freq: Two times a day (BID) | RESPIRATORY_TRACT | Status: DC
  Administered 2011-07-26 – 2011-07-27 (×3): 1 via RESPIRATORY_TRACT
  Filled 2011-07-25 (×2): qty 14

## 2011-07-25 MED ORDER — IOHEXOL 300 MG/ML  SOLN
20.0000 mL | INTRAMUSCULAR | Status: DC
Start: 2011-07-25 — End: 2011-07-25

## 2011-07-25 NOTE — ED Notes (Signed)
Patient with nausea and vomiting on and off since Wednesday.  Patient denies any pain.

## 2011-07-25 NOTE — Progress Notes (Signed)
07/25/2011 patient transfer from the emergency room to 6700 around 1045. She is alert and oriented, also complain of being weak. Patient was place on telemetry when arrive on the unit. Patient have two red spot on right lower abdomen and under right abdominal folds little dry and excoriated, heels dry. Patient was also place on droplet precaution. Biochemist, clinical.

## 2011-07-25 NOTE — ED Provider Notes (Signed)
Care assumed from Dr. Dierdre Highman at shift change.  I agree with his note, assessment, and plan.  The patient presents with weakness, back pain, and cough with nausea and vomiting for several days.  The workup shows an elevated wbc, elevated lipase, uti, and pneumonia on chest xray and abd ct.  I have re-examined the patient and she is noted to mild crackles in the bases bilaterally, the heart exam is normal, and the abd exam is benign.  I have consulted internal medicine for admission for treatment of uti and pneumonia.  Geoffery Lyons, MD 07/25/11 [phone]

## 2011-07-25 NOTE — ED Notes (Signed)
To ct and back

## 2011-07-25 NOTE — Progress Notes (Addendum)
Pharmacy: Lovenox Dose Adjustment for VTE px  OBJECTIVE:  Wt: 123.5 kg, Ht: 73 inches, BMI~35.9 SCr: 0.68, CrCl~70-80 ml/min  ASSESSMENT:  56 y.o. F on Lovenox for VTE prophylaxis requiring a dose adjustment for BMI>30 and CrCl>30 ml/min.  PLAN:  1. Increase Lovenox to 60 mg SQ every 24 hours for VTE px (0.5 mg/kg dosing for BMI >30 and CrCl>30 ml/min) 2. Will continue to monitor renal function for additional dose adjustments.  Georgina Pillion, PharmD, BCPS Clinical Pharmacist Pager: [phone] 07/25/2011 2:34 PM

## 2011-07-25 NOTE — H&P (Signed)
History and Physical       Hospital Admission Note Date: 07/25/2011  Patient name: Gabriella Nguyen Medical record number: [id number] Date of birth: 07-04-1955 Age: 56 y.o. Gender: female PCP: Illene Regulus, MD, MD   Chief Complaint:  Increasing weakness with nausea, vomiting, dehydrated, increased frequency of urination x 5 days  HPI: Patient is a 56 year old female with history of hypertension and osteoarthritis, asthma presented to [facility] ED with increasing generalized weakness, malaise, nausea and vomiting. History was obtained from the patient who stated that she started feeling poorly on Wednesday, 5 days ago when she had started nausea and vomiting. She vomited about 9 times on Wednesday, denied any hematemesis, abdominal pain, diarrhea. She continued to have nausea, saw Dr. Everardo All in the office was initially thought to have gastroenteritis. Patient stated that she also had a fever over 102 and was borderline hypotensive with BP over 112/76 at the time of the office visit. She denied any coughing, productive phlegm, chest pain or wheezing. She had poor appetite and felt dizzy and generalized weakness also this time. She also had one episode of vomiting yesterday and also noticed that she was having increased frequency of urination but no dysuria or hematuria. Patient denies any similar symptoms in the family.  In the ED, patient was noted to have low-grade fever of 100.2, borderline hypotensive 101/51, tachycardiac with heart rate of 103, leukocytosis with WBC 17.5, right lower lobe consolidation on chest x-ray, UTI, elevated lipase of 268 with no pancreatic inflammation on CT scan abdomen and pelvis. Hospitalist service was requested for admission.  Review of Systems:  Constitutional: Please see history of present illness   HEENT: Denies photophobia, eye pain, redness, hearing loss, ear pain, congestion, sore throat, rhinorrhea, sneezing,  mouth sores, trouble swallowing, neck pain, neck stiffness and tinnitus.   Respiratory: Please see history of present illness .   Cardiovascular: Denies chest pain, palpitations and leg swelling.  Gastrointestinal: Denies abdominal pain, diarrhea, constipation, blood in stool and abdominal distention.  she has left flank pain with nausea and vomiting since Wednesday, 5 days ago Genitourinary: Denies dysuria, hematuria,  and difficulty urinating. Has increased urination/urgency, left flank pain  Musculoskeletal: Denies myalgias, back pain, joint swelling, arthralgias and gait problem.  Skin: Denies pallor, rash and wound.  Neurological: Denies  seizures, syncope, numbness and headaches.  she feels overall weak and dizzy Hematological: Denies adenopathy. Easy bruising, personal or family bleeding history  Psychiatric/Behavioral: Denies suicidal ideation, mood changes, confusion, nervousness, sleep disturbance and agitation  Past Medical History: Past Medical History  Diagnosis Date  . ASTHMA   . OSTEOARTHRITIS, KNEES, BILATERAL   . Unspecified essential hypertension    History reviewed. No pertinent past surgical history.  Medications: Prior to Admission medications   Medication Sig Start Date End Date Taking? Authorizing Provider  acetaminophen (TYLENOL) 325 MG tablet Take 650 mg by mouth every 6 (six) hours as needed. For pain   Yes Historical Provider, MD  albuterol (PROVENTIL,VENTOLIN) 90 MCG/ACT inhaler Inhale 2 puffs into the lungs every 6 (six) hours as needed for wheezing. 09/02/10 08/28/11 Yes Jacques Navy, MD  carvedilol (COREG) 25 MG tablet TAKE 1 TABLET (25 MG TOTAL) BY MOUTH 2 (TWO) TIMES DAILY. 07/08/11  Yes Jacques Navy, MD  FORADIL AEROLIZER 12 MCG capsule for inhaler PLACE 1 CAPSULE (12 MCG TOTAL) INTO INHALER AND INHALE 2 (TWO) TIMES DAILY. 07/08/11  Yes Jacques Navy, MD  hydrochlorothiazide (HYDRODIURIL) 25 MG tablet TAKE 1 TABLET BY MOUTH DAILY  06/20/11  Yes Jacques Navy, MD  ibuprofen (ADVIL,MOTRIN) 200 MG tablet Take 800 mg by mouth every 6 (six) hours as needed. For pain   Yes Historical Provider, MD  losartan (COZAAR) 100 MG tablet TAKE 1 TABLET (100 MG TOTAL) BY MOUTH DAILY. 05/18/11  Yes Jacques Navy, MD  omega-3 acid ethyl esters (LOVAZA) 1 G capsule Take 2 g by mouth daily.   Yes Historical Provider, MD  ondansetron (ZOFRAN) 4 MG tablet Take 1 tablet (4 mg total) by mouth every 8 (eight) hours as needed for nausea. 07/22/11 07/29/11 Yes Romero Belling, MD  zafirlukast (ACCOLATE) 20 MG tablet TAKE 1 TABLET BY MOUTH TWICE DAILY. 05/25/11  Yes Jacques Navy, MD    Allergies:  No Known Allergies  Social History:  reports that she has quit smoking. She does not have any smokeless tobacco history on file. She reports that she drinks alcohol. She reports that she does not use illicit drugs.  Family History: History reviewed. No pertinent family history.  Physical Exam: Blood pressure 101/53, pulse 103, temperature 100.2 F (37.9 C), temperature source Oral, resp. rate 19, SpO2 96.00%. General: Alert, awake, oriented x3, in no acute distress. HEENT: anicteric sclera, pink conjunctiva, pupils equal and reactive to light and accomodation, dry mucus membranes Neck: supple, no masses or lymphadenopathy, no goiter, no bruits  Heart: Tachycardiac Regular rate and rhythm, without murmurs, rubs or gallops. Lungs: Decreased breath sound at the bases  R>L. Abdomen: Soft, nontender, nondistended, positive bowel sounds, no masses., Left flank pain Extremities: No clubbing, cyanosis or edema with positive pedal pulses. Neuro: Grossly intact, no focal neurological deficits, strength 5/5 upper and lower extremities bilaterally Psych: alert and oriented x 3, normal mood and affect Skin: no rashes or lesions, warm and dry   LABS on Admission:  Basic Metabolic Panel:  Lab 07/25/11 1610  NA 134*  K 3.1*  CL 99  CO2 25  GLUCOSE 160*  BUN 16  CREATININE  0.68  CALCIUM 9.5  MG --  PHOS --   Liver Function Tests:  Lab 07/25/11 0237  AST 16  ALT 9  ALKPHOS 97  BILITOT 0.7  PROT 7.1  ALBUMIN 2.5*    Lab 07/25/11 0237  LIPASE 268*  AMYLASE --   No results found for this basename: AMMONIA:2 in the last 168 hours CBC:  Lab 07/25/11 0237  WBC 17.5*  NEUTROABS --  HGB 13.7  HCT 39.7  MCV 88.2  PLT 170     Radiological Exams on Admission: Ct Abdomen Pelvis W Contrast  07/25/2011  *  IMPRESSION: 1.  Despite the history of elevated lipase, at this time the pancreas is morphologically normal in appearance, and demonstrates normal enhancement and no definite surrounding inflammatory changes. 2.  However, there is extensive airspace consolidation in the right lower lobe, concerning for sequela of a large volume aspiration and/or developing pneumonia. 3.  Multiple low attenuation lesions within the left lobe of the liver, as above, largest of which measures 3.1 x 3.7 cm, compatible with cysts. 4.  Small hiatal hernia. 5.  Small umbilical hernia containing only omental fat.  Original Report Authenticated By: Florencia Reasons, M.D.   Dg Chest Portable 1 View  07/25/2011  *RADIOLOGY REPORT*  Clinical Data: Nausea, vomiting.  PORTABLE CHEST - 1 VIEW  Comparison: None.  Findings: Right lower lobe consolidation.  Right perihilar fullness.  Heart size within normal limits. Cannot exclude a small right pleural effusion.  No pneumothorax.  No acute osseous abnormality.  IMPRESSION: Right lower lobe consolidation, concerning for pneumonia. Recommend PA and lateral radiographs after treatment to document resolution/exclude an underlying lesion.  Original Report Authenticated By: Waneta Martins, M.D.    Assessment/Plan Present on Admission:   .Aspiration pneumonia/ SIRS: Likely due to intractable nausea and vomiting patient may have aspirated, she does have SIRS secondary to fever/tachycardia/hypertension/leukocytosis with clinical and  radiology to evidence of infection. - Patient will be aggressively hydrated with IV fluids, obtain blood cultures, urine cultures, urine Legionella antigen, urine strep antigen, sputum culture, rule out flu - Patient be placed on broad-spectrum antibiotics including vancomycin and Zosyn, narrow antibiotics according to the culture results   .UTI (urinary tract infection) - Follow urine cultures, continue antibiotics   .Flank pain, acute: Likely secondary to pyelonephritis with UTI  - Obtain renal ultrasound, continue antibiotics, follow urine cultures   .Weakness generalized - Once clinically improving will obtain PT evaluation   .Elevated lipase: CT scan of the abdomen and pelvis was done which should not show any enhancement or inflammation around pancreas  - Will continue full liquid diet for now, obtain lipase in a.m.   Marland KitchenHypokalemia: Replaced   .Nausea and vomiting: Place on Zofran and Phenergan when necessary, aggressive IV fluid hydration, full liquid diet   .Unspecified essential hypertension: Currently borderline hypotension due to SIRS, hold all BP meds, continue aggressive hydration  DVT prophylaxis: Lovenox  CODE STATUS: Full CODE STATUS  Further plan will depend as patient's clinical course evolves and further radiologic and laboratory data become available.   @Time  Spent on Admission: 1 hour Evart Mcdonnell M.D. [facility] 07/25/2011, 10:03 AM

## 2011-07-25 NOTE — Progress Notes (Signed)
ANTIBIOTIC CONSULT NOTE - INITIAL  Pharmacy Consult for Vanc + Zosyn Indication: Empiric coverage in setting of SIRS and ?Aspiration PNA  No Known Allergies  Patient Measurements: Height: 6\' 1"  (185.4 cm) Weight: 272 lb 4.3 oz (123.5 kg) IBW/kg (Calculated) : 75.4   Vital Signs: Temp: 99.9 F (37.7 C) (04/29 1130) Temp src: Oral (04/29 1130) BP: 123/79 mmHg (04/29 1054) Pulse Rate: 103  (04/29 0918) Intake/Output from previous day:   Intake/Output from this shift:    Labs:  [facility] 07/25/11 0237  WBC 17.5*  HGB 13.7  PLT 170  LABCREA --  CREATININE 0.68   Estimated Creatinine Clearance: 115.9 ml/min (by C-G formula based on Cr of 0.68). No results found for this basename: VANCOTROUGH:2,VANCOPEAK:2,VANCORANDOM:2,GENTTROUGH:2,GENTPEAK:2,GENTRANDOM:2,TOBRATROUGH:2,TOBRAPEAK:2,TOBRARND:2,AMIKACINPEAK:2,AMIKACINTROU:2,AMIKACIN:2, in the last 72 hours   Microbiology: No results found for this or any previous visit (from the past 720 hour(s)).  Medical History: Past Medical History  Diagnosis Date  . ASTHMA   . OSTEOARTHRITIS, KNEES, BILATERAL   . Unspecified essential hypertension     Assessment: 56 y.o. F to start Vancomycin + Zosyn for empiric SIRS/aspiration PNA coverage thought to be related to UTI/pyelo and vomiting. Tmax/24h: 99.8, WBC 17.5, SCr 0.68, CrCl~70-80 ml/min (normalized). The patient already received Avelox 400 mg IV x 1 dose today.   Discussed with Dr. Isidoro Donning and only to order one dose of Vancomycin for now as patient not at risk of MRSA pathogens given history. Dr. Isidoro Donning will re-address with clinical status tomorrow if Vanc should be continued at that time.   Goal of Therapy:  Vancomycin trough level 15-20 mcg/ml  Plan:  1. Vancomycin 2250 mg IV x 1 2. Zosyn 3.375g IV every 8 hours 3. Will follow up plans to continue additional Vancomycin doses tomorrow.  Georgina Pillion, PharmD, BCPS Clinical Pharmacist Pager: [phone] 07/25/2011 11:56 AM

## 2011-07-25 NOTE — ED Provider Notes (Addendum)
History     CSN: [id number]  Arrival date & time 07/25/11  0224   First MD Initiated Contact with Patient 07/25/11 0235      Chief Complaint  Patient presents with  . Nausea  . Emesis    (Consider location/radiation/quality/duration/timing/severity/associated sxs/prior treatment) Patient is a 56 y.o. female presenting with vomiting. The history is provided by the patient.  Emesis  This is a new problem. The problem occurs more than 10 times per day. The problem has been gradually worsening. The emesis has an appearance of stomach contents. Maximum temperature: 4 days ago had a fever or 101 at PCP office. Associated symptoms include chills. Pertinent negatives include no abdominal pain, no fever and no headaches.   initially symptoms lasted about 24 hours and seem to be getting better and now worse again. She has associated mid back pain but denies any significant abdominal pain. No cough. No fevers today. No diarrhea. No productive sputum. Moderate in severity. No history of same. No known sick contacts.  Past Medical History  Diagnosis Date  . ASTHMA   . OSTEOARTHRITIS, KNEES, BILATERAL   . Unspecified essential hypertension     History reviewed. No pertinent past surgical history.  History reviewed. No pertinent family history.  History  Substance Use Topics  . Smoking status: Former Games developer  . Smokeless tobacco: Not on file  . Alcohol Use: Yes    OB History    Grav Para Term Preterm Abortions TAB SAB Ect Mult Living                  Review of Systems  Constitutional: Positive for chills. Negative for fever.  HENT: Negative for neck pain and neck stiffness.   Eyes: Negative for pain.  Respiratory: Negative for shortness of breath.   Cardiovascular: Negative for chest pain.  Gastrointestinal: Positive for vomiting. Negative for abdominal pain.  Genitourinary: Negative for dysuria.  Musculoskeletal: Positive for back pain.  Skin: Negative for rash.  Neurological:  Negative for headaches.  All other systems reviewed and are negative.    Allergies  Review of patient's allergies indicates no known allergies.  Home Medications   Current Outpatient Rx  Name Route Sig Dispense Refill  . ACETAMINOPHEN 325 MG PO TABS Oral Take 650 mg by mouth every 6 (six) hours as needed. For pain    . ALBUTEROL 90 MCG/ACT IN AERS Inhalation Inhale 2 puffs into the lungs every 6 (six) hours as needed for wheezing. 17 g 5  . CARVEDILOL 25 MG PO TABS  TAKE 1 TABLET (25 MG TOTAL) BY MOUTH 2 (TWO) TIMES DAILY. 180 tablet 1  . FORADIL AEROLIZER 12 MCG IN CAPS  PLACE 1 CAPSULE (12 MCG TOTAL) INTO INHALER AND INHALE 2 (TWO) TIMES DAILY. 1 each 0    Pt must make office visit for further refills  . HYDROCHLOROTHIAZIDE 25 MG PO TABS  TAKE 1 TABLET BY MOUTH DAILY 30 tablet 1    *PATIENT DUE FOR OFFICE VISIT*  . IBUPROFEN 200 MG PO TABS Oral Take 800 mg by mouth every 6 (six) hours as needed. For pain    . LOSARTAN POTASSIUM 100 MG PO TABS  TAKE 1 TABLET (100 MG TOTAL) BY MOUTH DAILY. 90 tablet 1  . OMEGA-3-ACID ETHYL ESTERS 1 G PO CAPS Oral Take 2 g by mouth daily.    Marland Kitchen ONDANSETRON HCL 4 MG PO TABS Oral Take 1 tablet (4 mg total) by mouth every 8 (eight) hours as needed for nausea.  30 tablet 0  . ZAFIRLUKAST 20 MG PO TABS  TAKE 1 TABLET BY MOUTH TWICE DAILY. 60 tablet 5    BP 101/51  Pulse 93  Temp(Src) 99.8 F (37.7 C) (Oral)  Resp 22  SpO2 97%  Physical Exam  Constitutional: She is oriented to person, place, and time. She appears well-developed and well-nourished.  HENT:  Head: Normocephalic and atraumatic.  Eyes: Conjunctivae and EOM are normal. Pupils are equal, round, and reactive to light.  Neck: Trachea normal. Neck supple. No thyromegaly present.  Cardiovascular: Normal rate, regular rhythm, S1 normal, S2 normal and normal pulses.     No systolic murmur is present   No diastolic murmur is present  Pulses:      Radial pulses are 2+ on the right side, and 2+ on  the left side.  Pulmonary/Chest: Effort normal and breath sounds normal. She has no wheezes. She has no rhonchi. She has no rales. She exhibits no tenderness.  Abdominal: Soft. Normal appearance and bowel sounds are normal. There is no tenderness. There is no CVA tenderness and negative Murphy's sign.       Obese with no tenderness on exam with deep palpation throughout  Musculoskeletal:       No CVA tenderness or midline tenderness BLE:s Calves nontender, no cords or erythema, negative Homans sign  Neurological: She is alert and oriented to person, place, and time. She has normal strength. No cranial nerve deficit or sensory deficit. GCS eye subscore is 4. GCS verbal subscore is 5. GCS motor subscore is 6.  Skin: Skin is warm and dry. No rash noted. She is not diaphoretic.  Psychiatric: Her speech is normal.       Cooperative and appropriate    ED Course  Procedures (including critical care time)  Labs Reviewed  CBC - Abnormal; Notable for the following:    WBC 17.5 (*)    All other components within normal limits  COMPREHENSIVE METABOLIC PANEL - Abnormal; Notable for the following:    Sodium 134 (*)    Potassium 3.1 (*)    Glucose, Bld 160 (*)    Albumin 2.5 (*)    All other components within normal limits  LIPASE, BLOOD - Abnormal; Notable for the following:    Lipase 268 (*)    All other components within normal limits  URINALYSIS, ROUTINE W REFLEX MICROSCOPIC - Abnormal; Notable for the following:    Color, Urine ORANGE (*) BIOCHEMICALS MAY BE AFFECTED BY COLOR   APPearance CLOUDY (*)    Specific Gravity, Urine 1.031 (*)    Bilirubin Urine SMALL (*)    Ketones, ur 15 (*)    Protein, ur 100 (*)    Urobilinogen, UA >8.0 (*)    Nitrite POSITIVE (*)    Leukocytes, UA SMALL (*)    All other components within normal limits  URINE MICROSCOPIC-ADD ON - Abnormal; Notable for the following:    Squamous Epithelial / LPF MANY (*)    Bacteria, UA MANY (*)    All other components  within normal limits   Dg Chest Portable 1 View  07/25/2011  *RADIOLOGY REPORT*  Clinical Data: Nausea, vomiting.  PORTABLE CHEST - 1 VIEW  Comparison: None.  Findings: Right lower lobe consolidation.  Right perihilar fullness.  Heart size within normal limits. Cannot exclude a small right pleural effusion.  No pneumothorax.  No acute osseous abnormality.  IMPRESSION: Right lower lobe consolidation, concerning for pneumonia. Recommend PA and lateral radiographs after treatment to document  resolution/exclude an underlying lesion.  Original Report Authenticated By: Waneta Martins, M.D.   IV fluids and Zofran. UA reviewed and chest x-ray reviewed with radiologist. Plan IV antibiotics or looks like pneumonia and pending CT scan of abdomen for elevated lipase concern for pancreatitis. No symptoms of cholecystitis. Labs reviewed as above   Date: 07/25/2011  Rate: 95  Rhythm: normal sinus rhythm  QRS Axis: normal  Intervals: normal  ST/T Wave abnormalities: nonspecific ST changes  Conduction Disutrbances:none  Narrative Interpretation:   Old EKG Reviewed: none available   MDM   Nausea vomiting with UTI, pneumonia and possible pancreatitis. CT pending and Dr. Judd Lien to follow, may require admission.        Sunnie Nielsen, MD 07/25/11 1610  Sunnie Nielsen, MD 07/25/11 [phone]

## 2011-07-25 NOTE — ED Notes (Signed)
Pt states she is feeling much better since receiving pain medication.

## 2011-07-25 NOTE — Progress Notes (Signed)
Utilization review completed.  

## 2011-07-26 DIAGNOSIS — J129 Viral pneumonia, unspecified: Secondary | ICD-10-CM

## 2011-07-26 LAB — LIPASE, BLOOD: Lipase: 246 U/L — ABNORMAL HIGH (ref 11–59)

## 2011-07-26 LAB — CBC
HCT: 35.3 % — ABNORMAL LOW (ref 36.0–46.0)
Hemoglobin: 12.1 g/dL (ref 12.0–15.0)
MCHC: 34.3 g/dL (ref 30.0–36.0)
RDW: 13.2 % (ref 11.5–15.5)
WBC: 13.6 10*3/uL — ABNORMAL HIGH (ref 4.0–10.5)

## 2011-07-26 LAB — LEGIONELLA ANTIGEN, URINE: Legionella Antigen, Urine: NONE SEEN

## 2011-07-26 LAB — BASIC METABOLIC PANEL
BUN: 14 mg/dL (ref 6–23)
Chloride: 105 mEq/L (ref 96–112)
GFR calc Af Amer: >90 mL/min (ref >90)
GFR calc non Af Amer: >90 mL/min (ref >90)
Potassium: 3 mEq/L — ABNORMAL LOW (ref 3.5–5.1)

## 2011-07-26 LAB — URINE CULTURE
Colony Count: NO GROWTH
Culture: NO GROWTH

## 2011-07-26 MED ORDER — POTASSIUM CHLORIDE CRYS ER 20 MEQ PO TBCR
20.0000 meq | EXTENDED_RELEASE_TABLET | Freq: Every day | ORAL | Status: DC
  Administered 2011-07-26 – 2011-07-27 (×2): 20 meq via ORAL
  Filled 2011-07-26 (×2): qty 1

## 2011-07-26 NOTE — Progress Notes (Signed)
Physical Therapy Evaluation Patient Details Name: Gabriella Nguyen MRN: [id number] DOB: [birth year] Today's Date: 07/26/2011 Time: 4098-1191 PT Time Calculation (min): 30 min  PT Assessment / Plan / Recommendation Clinical Impression  56 yo female admitted with aspiration pneumonia (in setting of recent bout with nauseau/vomitting) presents to PT with decreased functional capacity; Will benefit form PT to maximize independence and safety with mobiity/activity/endurance and enable safe dc home    PT Assessment  Patient needs continued PT services    Follow Up Recommendations  No PT follow up;Supervision/Assistance - 24 hour (will consider HHPT if necessary)    Equipment Recommendations  Cane (possibly cane, will further assess next session)    Frequency Min 3X/week    Precautions / Restrictions Restrictions Weight Bearing Restrictions: No Other Position/Activity Restrictions: Watch O2 sats on room air   Pertinent Vitals/Pain Session conducted on Room Air; Pt's O2 sats were at 88% with amb, DOE 3/4; with 4 minutes seated rest on Room Air, O2 sats incr to 93%     Mobility  Bed Mobility Bed Mobility: Supine to Sit;Sitting - Scoot to Edge of Bed;Sit to Supine Supine to Sit: 5: Supervision Sitting - Scoot to Edge of Bed: 5: Supervision Sit to Supine: 5: Supervision Details for Bed Mobility Assistance: Cues for safety, technique , and to self-monitor for activity tolerance Transfers Transfers: Sit to Stand;Stand to Sit Sit to Stand: 4: Min guard;From bed;With upper extremity assist;From chair/3-in-1;With armrests Stand to Sit: 4: Min guard;To bed;To chair/3-in-1;With upper extremity assist Details for Transfer Assistance: Cues for safety, and technique; noted dependent on UE support/ push for sit to stand from lower chair surface (likely secondary to arthritic pain) Ambulation/Gait Ambulation/Gait Assistance: 4: Min guard (without physical contact) Ambulation Distance (Feet): 150  Feet Assistive device: None Ambulation/Gait Assistance Details: slow pace; pt reports often being limited by arthritis pain in knees; cued pt to self monitor for activity tolerance Stairs:  (Pt does not foresee any problems with managing the steps)    Exercises     PT Goals Acute Rehab PT Goals PT Goal Formulation: With patient Time For Goal Achievement: 08/09/11 Potential to Achieve Goals: Good Pt will go Supine/Side to Sit: Independently PT Goal: Supine/Side to Sit - Progress: Goal set today Pt will go Sit to Supine/Side: Independently PT Goal: Sit to Supine/Side - Progress: Goal set today Pt will go Sit to Stand: Independently PT Goal: Sit to Stand - Progress: Goal set today Pt will go Stand to Sit: Independently PT Goal: Stand to Sit - Progress: Goal set today Pt will Ambulate: >150 feet;with modified independence;with least restrictive assistive device PT Goal: Ambulate - Progress: Goal set today Pt will Go Up / Down Stairs: 1-2 stairs;with modified independence;with least restrictive assistive device PT Goal: Up/Down Stairs - Progress: Goal set today  Visit Information  Last PT Received On: 07/26/11 Assistance Needed: +1    Subjective Data  Subjective: reports feeling weak; today's walk was furthest pt has gone since last Wed Patient Stated Goal: get better, go home   Prior Functioning  Home Living Lives With: Family;Spouse Available Help at Discharge: Family;Friend(s);Available 24 hours/day Type of Home: House Home Access: Stairs to enter [employer] of Steps: 2 Entrance Stairs-Rails: None (pt hold to wrought iron chair at doorway) Home Layout: One level Home Adaptive Equipment: None Prior Function Level of Independence: Independent Able to Take Stairs?: Yes Driving: Yes 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 Communication Communication: No difficulties    Cognition  Overall Cognitive  Status: Appears within functional limits for  tasks assessed/performed Arousal/Alertness: Awake/alert Orientation Level: Appears intact for tasks assessed Behavior During Session: [facility] for tasks performed    Extremity/Trunk Assessment Right Upper Extremity Assessment RUE ROM/Strength/Tone: Within functional levels Left Upper Extremity Assessment LUE ROM/Strength/Tone: Within functional levels Right Lower Extremity Assessment RLE ROM/Strength/Tone: WFL for tasks assessed (though not pain-free) Left Lower Extremity Assessment LLE ROM/Strength/Tone: WFL for tasks assessed (though not pain-free)   Balance    End of Session PT - End of Session Equipment Utilized During Treatment: Gait belt Activity Tolerance: Patient limited by fatigue;Patient tolerated treatment well Patient left: in bed;with call bell/phone within reach (Provided pt with incentive spirometer) Nurse Communication: Mobility status   Olen Pel [place], [place] [phone]  07/26/2011, 10:03 AM

## 2011-07-26 NOTE — Progress Notes (Signed)
Subjective: Patient was seen by Dr. Sherrilyn Rist Friday, 4/26, dx'd w/ gastroenteritis. She continued to feel bad, developed abdominal pain with N/V then she became febrile and SOB. ED evaluation revealed leukocytosis, consolidation RLL by CXR and CT c/w aspiration pneumonia. She was admitted for IV antibiotics.  This AM she is feeling better. N/V is resolved. She is not SOB.  Objective: Lab: Lab Results  Component Value Date   WBC 13.6* 07/26/2011   HGB 12.1 07/26/2011   HCT 35.3* 07/26/2011   MCV 88.3 07/26/2011   PLT 156 07/26/2011   BMET    Component Value Date/Time   NA 139 07/26/2011 0610   K 3.0* 07/26/2011 0610   CL 105 07/26/2011 0610   CO2 26 07/26/2011 0610   GLUCOSE 108* 07/26/2011 0610   BUN 14 07/26/2011 0610   CREATININE 0.65 07/26/2011 0610   CALCIUM 8.9 07/26/2011 0610   GFRNONAA >90 07/26/2011 0610   GFRAA >90 07/26/2011 0610      Imaging: Reveiwed CXR, - RLL infiltrate; CT chest abdomen - liver cysts, normal pancreas and RLL consolidation.  Physical Exam: Filed Vitals:   07/26/11 0544  BP: 108/66  Pulse: 73  Temp: 98.9 F (37.2 C)  Resp: 18  obese AA woman in no distress HEENT - dark circles around her eyes. Cor - 2+ radial pulse, no tachycardia Pulm - normal respirations, no increased WOB, feint crackles RLL Abd- obese     Assessment/Plan: 1.Pul/ID - aspiration pneumonia that is responding nicely to treatment. Day #2 Vanc/Zosyn. Blood cultures are pending. Plan  D/c Vanco  2. GU - urinalysis on repeat is negative.  3. hypolakemia - K is still low Plan   oral replacement  Negative for flu by nasal swab    Illene Regulus 07/26/2011, 7:14 AM

## 2011-07-27 DIAGNOSIS — J129 Viral pneumonia, unspecified: Secondary | ICD-10-CM

## 2011-07-27 LAB — BASIC METABOLIC PANEL
CO2: 23 mEq/L (ref 19–32)
Chloride: 107 mEq/L (ref 96–112)
GFR calc Af Amer: >90 mL/min (ref >90)
Potassium: 3 mEq/L — ABNORMAL LOW (ref 3.5–5.1)

## 2011-07-27 LAB — CBC
HCT: 33.3 % — ABNORMAL LOW (ref 36.0–46.0)
MCV: 88.3 fL (ref 78.0–100.0)
Platelets: 189 10*3/uL (ref 150–400)
RBC: 3.77 MIL/uL — ABNORMAL LOW (ref 3.87–5.11)
RDW: 13.3 % (ref 11.5–15.5)
WBC: 9.8 10*3/uL (ref 4.0–10.5)

## 2011-07-27 MED ORDER — CEFUROXIME AXETIL 250 MG PO TABS: 250.0000 mg | ORAL_TABLET | Freq: Two times a day (BID) | ORAL | Status: AC

## 2011-07-27 MED ORDER — POTASSIUM CHLORIDE CRYS ER 20 MEQ PO TBCR: 20.0000 meq | EXTENDED_RELEASE_TABLET | Freq: Every day | ORAL | Status: DC

## 2011-07-27 NOTE — Progress Notes (Signed)
Pt. Got discharge instructions,IV was d/c,pt. waitting for her right to arrive.

## 2011-07-27 NOTE — Progress Notes (Signed)
Subjective: Feeling much better and feels ready to go home  Objective: Lab: Lab Results  Component Value Date   WBC 9.8 07/27/2011   HGB 11.3* 07/27/2011   HCT 33.3* 07/27/2011   MCV 88.3 07/27/2011   PLT 189 07/27/2011     Imaging:no new imaging   Physical Exam: Filed Vitals:   07/27/11 0530  BP: 129/67  Pulse: 75  Temp: 98.6 F (37 C)  Resp: 17  see d/c summary    Assessment/Plan:  d/c home Dictation # [id number]   Illene Regulus 07/27/2011, 6:55 AM

## 2011-07-27 NOTE — Progress Notes (Addendum)
Physical Therapy Treatment Patient Details Name: Gabriella Nguyen MRN: [id number] DOB: [birth year] Today's Date: 07/27/2011 Time: 1035-     PT Assessment / Plan / Recommendation Comments on Treatment Session  Better amb, activity tol today; Performed incentive spirometry correctly; OK for dc homefrom PT standpoint Discussed potential benefit of Outpt PT for arthritis if/when arthritis pain impacts daily living    Follow Up Recommendations  No PT follow up;Supervision/Assistance - 24 hour    Equipment Recommendations   (pt can borrow friend's cane)    Frequency Min 3X/week   Plan Discharge plan remains appropriate    Precautions / Restrictions Restrictions Other Position/Activity Restrictions: Watch O2 sats on room air   Pertinent Vitals/Pain O2 sats with amb on Room Air 94%    Mobility  Transfers Transfers: Sit to Stand;Stand to Sit Sit to Stand: 6: Modified independent (Device/Increase time) Stand to Sit: 6: Modified independent (Device/Increase time) Details for Transfer Assistance: Able to stand slowly and independently, also while holding clothing Ambulation/Gait Ambulation/Gait Assistance: 5: Supervision Assistive device: None Ambulation/Gait Assistance Details: continued slow pace         PT Goals Acute Rehab PT Goals Time For Goal Achievement: 08/09/11 Potential to Achieve Goals: Good Pt will go Supine/Side to Sit: Independently PT Goal: Supine/Side to Sit - Progress: Met Pt will go Sit to Stand: Independently PT Goal: Sit to Stand - Progress: Met Pt will go Stand to Sit: Independently PT Goal: Stand to Sit - Progress: Met Pt will Ambulate: >150 feet;with modified independence;with least restrictive assistive device PT Goal: Ambulate - Progress: Met  Visit Information  Last PT Received On: 07/27/11 Assistance Needed: +1    Subjective Data  Subjective: Happy to be going home   Cognition  Overall Cognitive Status: Appears within functional limits for tasks  assessed/performed Arousal/Alertness: Awake/alert Orientation Level: Appears intact for tasks assessed Behavior During Session: [facility] for tasks performed    Balance     End of Session PT - End of Session Activity Tolerance: Patient tolerated treatment well Patient left: in bed;with call bell/phone within reach (EOB)    Van Clines [place], [place] [phone]  07/27/2011, 1:28 PM

## 2011-07-27 NOTE — Discharge Summary (Signed)
Gabriella Nguyen, Gabriella Nguyen              ACCOUNT NO.:  [id number]  MEDICAL RECORD NO.:  [id number]  LOCATION:  6742                         FACILITY:  [facility]  PHYSICIAN:  Rosalyn Gess. Lissett Favorite, MD  DATE OF BIRTH:  Aug 12, 1955  DATE OF ADMISSION:  07/25/2011 DATE OF DISCHARGE:  07/27/2011                              DISCHARGE SUMMARY   ADMISSION DIAGNOSES: 1. Aspiration pneumonia. 2. Chronic obstructive pulmonary disease. 3. Question of urinary tract infection. 4. Generalized weakness. 5. Nausea and vomiting. 6. Hypokalemia.  DISCHARGE DIAGNOSES: 1. Aspiration pneumonia, stabilized and treated. 2. Urinary tract infection, negative cultures. 3. Generalized weakness, improved with physical therapy having seen     the patient.  She would possibly benefit from home health physical     therapy to be decided at her office followup. 4. Hypokalemia, replaced.  CONSULTANTS:  None.  PROCEDURES: 1. Chest x-ray, 1 view, at admission which showed right lower lobe     consolidation concerning for pneumonia. PA and lateral film recommended. 1. CT abdomen and pelvis, lung bases revealed extensive airspace     consolidation in the right lower lobe which may represent sequelae     of recent aspiration or underlying pneumonia.  The pancreas was     morphologically normal in appearance.  Demonstrated normal     enhancement with no surrounding inflammatory changes.  Multiple low-     attenuation lesions in the left lobe of the liver, compatible with     simple cysts.  Small hiatal hernia.  Small umbilical hernia     containing only omental fat.  HISTORY OF PRESENT ILLNESS:  The patient is a 56 year old woman followed for hypertension, osteoarthritis, significant asthma, on home medications who presented with generalized weakness, malaise, nausea, and vomiting.  She reports she started feeling bad 5 days prior to admission with nausea and vomiting with 9 episodes on Wednesday.  She denied any  hematemesis, abdominal pain, or diarrhea.  She was seen by Dr. Everardo All in the office on Friday, July 22, 2011, diagnosed with gastroenteritis.  The patient had an episode of vomiting the day prior to admission and since that time noticed increased difficulty with breathing and fever.  She presented to the emergency department where she had a temperature of 100.2, blood pressure of 101/51, heart rate of 103, leukocytosis at 17.5, right lower lobe consolidation on chest x- ray.  She had an elevated lipase at 268, but negative CT evaluation is noted. Please see the H and P and other Epic records for past medical history, family history, and social history.  MEDICATIONS AT ADMISSION: 1. Tylenol 650 q.6 h. p.r.n. 2. Proventil 2 puffs every 6 hours as needed. 3. Carvedilol 25 mg b.i.d. 4. Foradil 1 inhalation 2 times a day. 5. Hydrochlorothiazide 25 mg daily. 6. Advil 200 mg q.6 h. p.r.n. 7. Cozaar 100 mg daily. 8. Omega-3 fatty acids. 9. Ondansetron 4 mg q.8 h. p.r.n. 10.Accolate 20 mg twice daily.  HOSPITAL COURSE: 1. Pulmonary.  The patient with evidence of aspiration pneumonia.  She     was started on vancomycin and Zosyn.  She rapidly responded to     treatment.  She never really became febrile.  Her white blood count     came down nicely.  It was felt that she could be managed with a     single antibiotic and vancomycin was discontinued.  Blood cultures     are still pending.  The patient had no problem with progressive     respiratory distress and actually was improved.  On the day of     discharge, she had no increased work of breathing, no wheezing and     felt she was at her baseline.  At this point, the patient is stable     and ready for discharge.  We will have her continue on Ceftin 250     mg b.i.d. for an additional 5 days.  She will continue all of her     home medications as prior to admission. 2. GU.  The patient had a repeat urinalysis after being seen in the      emergency department and was negative.  No treatment for this was     indicated. 3. Hypokalemia.  The patient was given oral replacement.  Potassium is     still pending from the day of discharge.  We will follow this up as     an outpatient.  We will have her take additional potassium at this     time at 20 mEq daily until seen in followup. 4. Nausea and vomiting.  The patient's nausea and vomiting did     resolve.  She had no increased need for antiemetics, and she was     tolerating a diet.  It is felt her gastroenteritis was resolved.  With the patient having no respiratory distress, with her white count having come down to 9800, which is normal range, at this point she is stable and ready for discharge home.  DISCHARGE EXAMINATION:  VITAL SIGNS:  Temperature was 98.6, blood pressure 129/67, pulse 75, respirations 17, oxygen saturation was 96% on room air. GENERAL APPEARANCE:  This is a heavy-set, African American woman, in no acute distress. HEENT:  Unremarkable. NECK:  Supple. LUNGS:  Chest bases moving air well.  I did not appreciate any rales or wheezes.  There is no increased work of breathing. CARDIOVASCULAR:  2+ radial pulse.  Her precordium was quiet.  She had a regular rate and rhythm. ABDOMEN:  Soft.  DISPOSITION:  The patient will be discharged to home.  She will continue all of her home medications except for the addition of Ceftin and potassium.  She will be seen in the office for followup on Monday, Aug 01, 2011.  The patient's condition at the time of discharge dictation is stable and improved.     Rosalyn Gess Yukie Bergeron, MD     MEN/MEDQ  D:  07/27/2011  T:  07/27/2011  Job:  [id number]

## 2011-07-31 LAB — CULTURE, BLOOD (ROUTINE X 2)
Culture  Setup Time: 201304291652
Culture: NO GROWTH
Culture: NO GROWTH

## 2011-08-01 ENCOUNTER — Encounter: Payer: Self-pay | Admitting: Internal Medicine

## 2011-08-01 ENCOUNTER — Ambulatory Visit (INDEPENDENT_AMBULATORY_CARE_PROVIDER_SITE_OTHER): Payer: BC Managed Care – PPO | Admitting: Internal Medicine

## 2011-08-01 VITALS — BP 102/70 | HR 80 | Temp 99.0°F | Resp 16 | Wt 266.0 lb

## 2011-08-01 DIAGNOSIS — E876 Hypokalemia: Secondary | ICD-10-CM

## 2011-08-01 DIAGNOSIS — J690 Pneumonitis due to inhalation of food and vomit: Secondary | ICD-10-CM

## 2011-08-01 DIAGNOSIS — I10 Essential (primary) hypertension: Secondary | ICD-10-CM

## 2011-08-01 DIAGNOSIS — J45909 Unspecified asthma, uncomplicated: Secondary | ICD-10-CM

## 2011-08-01 NOTE — Patient Instructions (Signed)
Doing really well. No evidence of persistent pneumonia. NO need for additional treatment. You will be a bit more fatigued for a week or so but you will make a full recovery.  Check your blood pressure from time to time. If it stays down we will revisit your medications.  Please come see me in 3 months

## 2011-08-01 NOTE — Assessment & Plan Note (Signed)
Aspiration pneumonia - making a good recovery.  Plan- Continue respiratory medications  May return to work but she will fatigue easily for a week or two.  Continue low salt low fat diet  F/U CXR at next visit.

## 2011-08-01 NOTE — Assessment & Plan Note (Signed)
BP Readings from Last 3 Encounters:  08/01/11 102/70  07/27/11 137/74  07/22/11 112/76   If BP continues to run low over the next 10-14 days will look at changing medications.

## 2011-08-01 NOTE — Assessment & Plan Note (Signed)
Continue present long-term, chronic medications.

## 2011-08-01 NOTE — Progress Notes (Signed)
  Subjective:    Patient ID: Gabriella Nguyen, female    DOB: 1955-04-12, 56 y.o.   MRN: [id number]  HPI Ms. Servais presents for hospital follow-up. She had GI symptoms with nausea and vomiting and suffered aspiration pneumonia with dense RLL consolidation. She did respond to Vanc/Zosyn and zosyn alone the last severalvhospital days At discharge May 1st she had done well and was to complete her antibiotic coverage with 5 days of ceftin 250 mg bid, the last of which she took last night. She has been doing well and resuming her activities. She feels weak and more fatigued than usual but this was explained to her as the normal course after a serious pneumonia. She does feel she is able to return to work.   During her hospital stay she did require potassium replacement. She is due for follow-up lab.   Past Medical History  Diagnosis Date  . ASTHMA   . OSTEOARTHRITIS, KNEES, BILATERAL   . Unspecified essential hypertension    No past surgical history on file. No family history on file. History   Social History  . Marital Status: Single    Spouse Name: N/A    Number of Children: N/A  . Years of Education: N/A   Occupational History  . Not on file.   Social History Main Topics  . Smoking status: Former Games developer  . Smokeless tobacco: Not on file  . Alcohol Use: Yes  . Drug Use: No  . Sexually Active: Not on file   Other Topics Concern  . Not on file   Social History Narrative  . No narrative on file       Review of Systems System review is negative for any constitutional, cardiac, pulmonary, GI or neuro symptoms or complaints other than as described in the HPI.     Objective:   Physical Exam Filed Vitals:   08/01/11 1126  BP: 102/70  Pulse: 80  Temp: 99 F (37.2 C)  Resp: 16  O2 sat 94% RA  Gen'l- opbese AA woman in no distress HEENt - c&S clear Neck - supple Nodes - negative neck Cor- RRR w/o murmur PUlm- good breath sounds, no rales or wheeze. Neuro -  normal        Assessment & Plan:  Hypokalemia - during hospital stay needed supplementation  Plan She will return to lab for Bmet.

## 2011-08-02 ENCOUNTER — Telehealth: Payer: Self-pay | Admitting: *Deleted

## 2011-08-02 ENCOUNTER — Other Ambulatory Visit (INDEPENDENT_AMBULATORY_CARE_PROVIDER_SITE_OTHER): Payer: BC Managed Care – PPO

## 2011-08-02 DIAGNOSIS — E876 Hypokalemia: Secondary | ICD-10-CM

## 2011-08-02 LAB — COMPREHENSIVE METABOLIC PANEL
ALT: 21 U/L (ref 0–35)
Albumin: 3 g/dL — ABNORMAL LOW (ref 3.5–5.2)
CO2: 29 mEq/L (ref 19–32)
Calcium: 9.4 mg/dL (ref 8.4–10.5)
Chloride: 102 mEq/L (ref 96–112)
GFR: 116.66 mL/min (ref >60.00)
Glucose, Bld: 101 mg/dL — ABNORMAL HIGH (ref 70–99)
Sodium: 141 mEq/L (ref 135–145)
Total Protein: 7.2 g/dL (ref 6.0–8.3)

## 2011-08-02 NOTE — Telephone Encounter (Signed)
Patient notified to come for lab work for potassium level.Marland KitchenMarland KitchenMarland KitchenFannie Nguyen

## 2011-08-04 ENCOUNTER — Other Ambulatory Visit: Payer: Self-pay

## 2011-08-04 MED ORDER — HYDROCHLOROTHIAZIDE 25 MG PO TABS: 25.0000 mg | ORAL_TABLET | Freq: Every day | ORAL | Status: DC

## 2011-08-04 MED ORDER — LOSARTAN POTASSIUM 100 MG PO TABS: 100.0000 mg | ORAL_TABLET | Freq: Every day | ORAL | Status: DC

## 2011-08-04 MED ORDER — FORMOTEROL FUMARATE 12 MCG IN CAPS: 12.0000 ug | ORAL_CAPSULE | Freq: Two times a day (BID) | RESPIRATORY_TRACT | Status: DC

## 2011-08-05 ENCOUNTER — Telehealth: Payer: Self-pay

## 2011-08-05 MED ORDER — FLUCONAZOLE 150 MG PO TABS: 150.0000 mg | ORAL_TABLET | Freq: Once | ORAL | Status: AC

## 2011-08-05 NOTE — Telephone Encounter (Signed)
Diflucan 150 mg x 1 OV w/Dr Norins if not well Thx

## 2011-08-05 NOTE — Telephone Encounter (Signed)
Pt called requesting Rx for Diflucan to treat vaginal yeast infection. Pt advised that request may be answered over the weekend so she should check with her pharmacy. Pt agreed.

## 2011-08-05 NOTE — Telephone Encounter (Signed)
Pt advised of Rx/pharmacy 

## 2011-08-08 ENCOUNTER — Encounter: Payer: Self-pay | Admitting: Internal Medicine

## 2011-08-14 ENCOUNTER — Other Ambulatory Visit: Payer: Self-pay | Admitting: Internal Medicine

## 2011-09-01 ENCOUNTER — Telehealth: Payer: Self-pay | Admitting: Internal Medicine

## 2011-09-01 ENCOUNTER — Ambulatory Visit (INDEPENDENT_AMBULATORY_CARE_PROVIDER_SITE_OTHER): Payer: BC Managed Care – PPO | Admitting: Endocrinology

## 2011-09-01 ENCOUNTER — Encounter: Payer: Self-pay | Admitting: Endocrinology

## 2011-09-01 VITALS — BP 118/70 | HR 85 | Temp 98.4°F

## 2011-09-01 DIAGNOSIS — L089 Local infection of the skin and subcutaneous tissue, unspecified: Secondary | ICD-10-CM

## 2011-09-01 MED ORDER — FLUCONAZOLE 150 MG PO TABS: 150.0000 mg | ORAL_TABLET | Freq: Every day | ORAL | Status: AC

## 2011-09-01 MED ORDER — DOXYCYCLINE HYCLATE 100 MG PO TABS: 100.0000 mg | ORAL_TABLET | Freq: Two times a day (BID) | ORAL | Status: AC

## 2011-09-01 NOTE — Telephone Encounter (Signed)
Caller: Jullianna/Patient; PCP: Illene Regulus; CB#: [phone]; Call regarding Has A. Place Under Her Arm that was itchy/Causing Pain- possible bite when she woke up in the morning- onset 08/25/11; She has been applying Neosporin and not getting better. Area came to head and drained pus. This morning after shower draining  again-09/01/11. She has hard lump that is silver dollar sized in her armpit. Triage and Care advice per Skin Lesion Protocol and appnt advised within 4 hours for "any skin lesion with signs and sx of worsening infection...". Appnt scheduled for 1415 -09/01/11.

## 2011-09-01 NOTE — Patient Instructions (Addendum)
i have sent 2 prescriptions to your pharmacy, for an antibiotic, and for the yeast infection. I hope you feel better soon.  If you don't feel better by next week, please call back.  Please call sooner if you get worse.

## 2011-09-01 NOTE — Progress Notes (Signed)
  Subjective:    Patient ID: Gabriella Nguyen, female    DOB: 1955-12-20, 56 y.o.   MRN: [id number]  HPI 1 week of slight bump at the right axilla, and assoc pain.  She says it has been draining for a few days, and has gotten smaller.  Past Medical History  Diagnosis Date  . ASTHMA   . OSTEOARTHRITIS, KNEES, BILATERAL   . Unspecified essential hypertension     No past surgical history on file.  History   Social History  . Marital Status: Single    Spouse Name: N/A    Number of Children: N/A  . Years of Education: N/A   Occupational History  . Not on file.   Social History Main Topics  . Smoking status: Former Games developer  . Smokeless tobacco: Not on file  . Alcohol Use: Yes  . Drug Use: No  . Sexually Active: Not on file   Other Topics Concern  . Not on file   Social History Narrative  . No narrative on file    Current Outpatient Prescriptions on File Prior to Visit  Medication Sig Dispense Refill  . acetaminophen (TYLENOL) 325 MG tablet Take 650 mg by mouth every 6 (six) hours as needed. For pain      . carvedilol (COREG) 25 MG tablet TAKE 1 TABLET (25 MG TOTAL) BY MOUTH 2 (TWO) TIMES DAILY.  180 tablet  1  . formoterol (FORADIL AEROLIZER) 12 MCG capsule for inhaler Place 1 capsule (12 mcg total) into inhaler and inhale 2 (two) times daily.  180 capsule  3  . hydrochlorothiazide (HYDRODIURIL) 25 MG tablet Take 1 tablet (25 mg total) by mouth daily.  90 tablet  3  . ibuprofen (ADVIL,MOTRIN) 200 MG tablet Take 800 mg by mouth every 6 (six) hours as needed. For pain      . losartan (COZAAR) 100 MG tablet Take 1 tablet (100 mg total) by mouth daily.  90 tablet  3  . omega-3 acid ethyl esters (LOVAZA) 1 G capsule Take 2 g by mouth daily.      . potassium chloride SA (K-DUR,KLOR-CON) 20 MEQ tablet Take 1 tablet (20 mEq total) by mouth daily.  30 tablet  1  . zafirlukast (ACCOLATE) 20 MG tablet TAKE 1 TABLET BY MOUTH TWICE DAILY.  60 tablet  5  . albuterol (PROVENTIL,VENTOLIN) 90  MCG/ACT inhaler Inhale 2 puffs into the lungs every 6 (six) hours as needed for wheezing.  17 g  5    No Known Allergies  No family history on file.  BP 118/70  Pulse 85  Temp 98.4 F (36.9 C) (Oral)  SpO2 97%  Review of Systems denies fever.  She gets vaginitis with abx    Objective:   Physical Exam VITAL SIGNS:  See vs page GENERAL: no distress Right axilla:  2 cm pustule, with purulent drainage     Assessment & Plan:  Pustule, new Vaginitis, due to abx

## 2011-09-06 ENCOUNTER — Ambulatory Visit: Payer: BC Managed Care – PPO | Admitting: Internal Medicine

## 2011-09-19 ENCOUNTER — Other Ambulatory Visit: Payer: Self-pay | Admitting: *Deleted

## 2011-09-19 MED ORDER — ALBUTEROL SULFATE HFA 108 (90 BASE) MCG/ACT IN AERS: 2.0000 | INHALATION_SPRAY | Freq: Four times a day (QID) | RESPIRATORY_TRACT | Status: DC | PRN

## 2011-09-20 ENCOUNTER — Encounter: Payer: Self-pay | Admitting: Internal Medicine

## 2011-09-20 ENCOUNTER — Telehealth: Payer: Self-pay

## 2011-09-20 ENCOUNTER — Ambulatory Visit (INDEPENDENT_AMBULATORY_CARE_PROVIDER_SITE_OTHER)
Admission: RE | Admit: 2011-09-20 | Discharge: 2011-09-20 | Disposition: A | Discharge status: Home / Self Care | Payer: BC Managed Care – PPO | Source: Ambulatory Visit | Attending: Internal Medicine | Admitting: Internal Medicine

## 2011-09-20 ENCOUNTER — Ambulatory Visit (INDEPENDENT_AMBULATORY_CARE_PROVIDER_SITE_OTHER): Payer: BC Managed Care – PPO | Admitting: Internal Medicine

## 2011-09-20 VITALS — BP 140/82 | HR 89 | Temp 98.9°F | Ht 73.0 in

## 2011-09-20 DIAGNOSIS — J45909 Unspecified asthma, uncomplicated: Secondary | ICD-10-CM

## 2011-09-20 MED ORDER — LEVOFLOXACIN 500 MG PO TABS: 500.0000 mg | ORAL_TABLET | Freq: Every day | ORAL | Status: AC

## 2011-09-20 MED ORDER — DM-GUAIFENESIN ER 30-600 MG PO TB12: 1.0000 | ORAL_TABLET | Freq: Two times a day (BID) | ORAL | Status: AC

## 2011-09-20 NOTE — Patient Instructions (Addendum)
It was good to see you today. Test(s) ordered today. Your results will be called to you after review (48-72hours after test completion). If any changes need to be made, you will be notified at that time. Levaquin antibiotics -Your prescription(s) have been submitted to your pharmacy. Please take as directed and contact our office if you believe you are having problem(s) with the medication(s). Mucinex for cough, use rescue inhaler as needed and Alternate between ibuprofen and tylenol for aches, pain and fever symptoms as discussed

## 2011-09-20 NOTE — Progress Notes (Signed)
  Subjective:    HPI  complains of cough  Onset >1 week ago, progressively worse symptoms  associated with sore throat, mild headache and low grade fever progression to mod chest congestion > yellow sputum No relief with OTC meds, taking usual asthma inhalers   Past Medical History  Diagnosis Date  . ASTHMA   . OSTEOARTHRITIS, KNEES, BILATERAL   . Unspecified essential hypertension   . Aspiration pneumonia 06/2011 hosp    Review of Systems Constitutional: No fever or night sweats, no unexpected weight change Pulmonary: No pleurisy or hemoptysis Cardiovascular: No chest pain or palpitations     Objective:   Physical Exam BP 140/82  Pulse 89  Temp 98.9 F (37.2 C) (Oral)  Ht 6\' 1"  (1.854 m)  SpO2 90% GEN: mildly ill appearing and audible head/chest congestion HENT: NCAT, no sinus tenderness bilaterally, nares with clear discharge, oropharynx mild erythema, no exudate Eyes: Vision grossly intact, no conjunctivitis Lungs: B rhonchi, R>L with mild end exp wheeze, no increased work of breathing at rest Cardiovascular: Regular rate and rhythm, no bilateral edema      Assessment & Plan:   Asthmatic bronchitis  Hx RLL PNA 06/2011 - aspiration   Empiric antibiotics prescribed due to symptom duration greater than 7 days and comorbid dz OTC cough suppression -  Check CXR now Symptomatic care with Tylenol or Advil, hydration and rest -  salt gargle advised as needed

## 2011-09-20 NOTE — Telephone Encounter (Signed)
[facility] Triage Call Report Triage Record Num: [id number] Operator: Valene Bors Patient Name: Gabriella Nguyen Call Date & Time: 09/17/2011 3:38:58PM Patient Phone: [phone] PCP: Rene Paci Patient Gender: Female PCP Fax : [phone] Patient DOB: [birth year] [facility] Name[facility] Reason for Call: Caller: Bentlie/Patient; PCP: Illene Regulus; CB#: [phone]; Call regarding Cough/Congestion onset 09/12/11- having coughing spells, sore throat on 6/15 and 09/11/11- resolved by 09/12/11 but still congested and occasional cough with yellowish mucos. Pneumonia Hospitalized 07/24/11- 07/28/11. She has hx Asthma and has been using Rescue Albuterol Inhaler -2 puffs twice daily since 09/11/11. Wheezing with coughing speels. She was seen in office for f/u visit a few days after hospitalization and again for infected hair follicle/boil that drained and put on antibiotic- finished ~ 09/06/11. Boil is better. Triage and care advice per Cough Protocol and advised to be checked within 4 hours for "new onset or worsening cough AND asthma with increasing frequency of flair-ups since last scheduled appnt. [facility] advised. Protocol(s) Used: Cough - Adult Recommended Outcome per Protocol: See Provider within 4 hours Reason for Outcome: New onset or worsening cough AND asthma with increasing frequency of flare-ups since last scheduled appointment Care Advice: ~ Another adult should drive. Limit or avoid exposure to irritants and allergens (e.g. air pollution, smoke/smoking, chemicals, dust, pollen, pet dander, etc.) ~ ~ Call provider if symptoms worsen or new symptoms develop. ~ Avoid any activity that produces symptoms until evaluated by provider. Call EMS 911 if sudden worsening of breathing problems, struggling to breathe, high pitched noise when breathing in (stridor), unable to speak, grasping at throat, or panic/anxiety because of breathing problems. ~ ~ Use prescribed  rescue medication (inhaler, nebulizer) as directed. ~ SYMPTOM / CONDITION MANAGEMENT ~ CAUTIONS ~ List, or take, all current prescription(s), nonprescription or alternative medication(s) to provider for evaluation. 09/17/2011 4:04:36PM Page 1 of 1 [id number]

## 2011-09-21 ENCOUNTER — Telehealth: Payer: Self-pay

## 2011-09-21 NOTE — Telephone Encounter (Signed)
Ok - note for this week

## 2011-09-21 NOTE — Telephone Encounter (Signed)
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 Pt is requesting out of work note for a few days while she gets over cough, please advise.

## 2011-09-23 NOTE — Telephone Encounter (Signed)
Left message on machine for pt to return my call  

## 2011-09-23 NOTE — Telephone Encounter (Signed)
Pt advised and letter prepared, pt personally pick up letter.

## 2011-10-05 ENCOUNTER — Other Ambulatory Visit: Payer: Self-pay | Admitting: Internal Medicine

## 2011-10-05 DIAGNOSIS — M1710 Unilateral primary osteoarthritis, unspecified knee: Secondary | ICD-10-CM

## 2011-10-05 NOTE — Telephone Encounter (Signed)
Caller: Gabriella Nguyen/Patient is calling with a question about Pain Meds. Old left knee injury and chronic arthritis, now also having right knee pain.  Has seen Dr. Debby Bud for the same sxs in the past.  States she has spoken to Dr. Debby Bud about an Ortho referral and that never happened.  States she would like to have something for pain called in, and would also like to discuss a referral to Dr. Lequita Halt ortho surgeon.  Uses pharmacy on file, [place] [facility] on [facility] [facility] [address] [facility] [address] Ginette Otto.  Advised she may need appt prior to pain meds being called in, verbalizes understanding.  Asked to have someone call her back tomorrow to discuss pain meds and referral.  All emergent sxs r/o per "Knee Non-Injury" protocol with the exception of "Swelling of soft tissue above or below kneecap, sore to touch, AND pain with usual movement."  See Provider within 72 hours reached, wanted to speak with Dr. Debby Bud first.

## 2011-10-06 MED ORDER — HYDROCODONE-ACETAMINOPHEN 5-325 MG PO TABS: 1.0000 | ORAL_TABLET | Freq: Four times a day (QID) | ORAL | Status: DC | PRN

## 2011-10-06 MED ORDER — MELOXICAM 15 MG PO TABS: 15.0000 mg | ORAL_TABLET | Freq: Every day | ORAL | Status: DC

## 2011-10-06 MED ORDER — MELOXICAM 15 MG PO TABS: 15.0000 mg | ORAL_TABLET | Freq: Every day | ORAL | Status: AC

## 2011-10-06 NOTE — Telephone Encounter (Signed)
Patient notified of Dr, Debby Bud instructions. Rxs sent to pharmacy.

## 2011-10-06 NOTE — Telephone Encounter (Signed)
Reviewed chart back 6 months - find no notes that address her knee pain. cannot locate previous Rx for pain. She is supposed to be taking ibuprofen 800 mg tid.  1. [facility] notified re: referral to Dr. Lequita Halt 2. For pain - stop ibuprofen          Start meloxicam 15 mg daily - Rx sent          Hydrocodone/APAP 5/325 q 6 # 30, 1 refill - please call in.

## 2011-11-17 ENCOUNTER — Encounter: Payer: Self-pay | Admitting: Internal Medicine

## 2011-11-17 ENCOUNTER — Ambulatory Visit (INDEPENDENT_AMBULATORY_CARE_PROVIDER_SITE_OTHER): Payer: BC Managed Care – PPO | Admitting: Internal Medicine

## 2011-11-17 VITALS — BP 110/78 | HR 98 | Temp 99.4°F | Resp 16 | Wt 266.0 lb

## 2011-11-17 DIAGNOSIS — L989 Disorder of the skin and subcutaneous tissue, unspecified: Secondary | ICD-10-CM

## 2011-11-20 NOTE — Progress Notes (Signed)
Subjective:    Patient ID: Gabriella Nguyen, female    DOB: [birth year], 56 y.o.   MRN: [id number]  HPI Mrs. Gutridge presents acutely for evaluation of skin lesion(s) on her abdomen with a concern for zoster. These lesions arose spontaneously, are not painful or pruitic, have not had any drainage or swelling. She has had no fever or chills.  Past Medical History  Diagnosis Date  . ASTHMA   . OSTEOARTHRITIS, KNEES, BILATERAL   . Unspecified essential hypertension   . Aspiration pneumonia 06/2011 hosp   No past surgical history on file. No family history on file. History   Social History  . Marital Status: Single    Spouse Name: N/A    Number of Children: N/A  . Years of Education: N/A   Occupational History  . Not on file.   Social History Main Topics  . Smoking status: Former Games developer  . Smokeless tobacco: Not on file  . Alcohol Use: Yes  . Drug Use: No  . Sexually Active: Not on file   Other Topics Concern  . Not on file   Social History Narrative  . No narrative on file    Current Outpatient Prescriptions on File Prior to Visit  Medication Sig Dispense Refill  . acetaminophen (TYLENOL) 325 MG tablet Take 650 mg by mouth every 6 (six) hours as needed. For pain      . albuterol (PROVENTIL HFA;VENTOLIN HFA) 108 (90 BASE) MCG/ACT inhaler Inhale 2 puffs into the lungs every 6 (six) hours as needed for wheezing.  1 Inhaler  5  . albuterol (PROVENTIL,VENTOLIN) 90 MCG/ACT inhaler Inhale 2 puffs into the lungs every 6 (six) hours as needed.      . carvedilol (COREG) 25 MG tablet TAKE 1 TABLET (25 MG TOTAL) BY MOUTH 2 (TWO) TIMES DAILY.  180 tablet  1  . formoterol (FORADIL AEROLIZER) 12 MCG capsule for inhaler Place 1 capsule (12 mcg total) into inhaler and inhale 2 (two) times daily.  180 capsule  3  . Glucosamine 500 MG TABS Take by mouth daily.      . hydrochlorothiazide (HYDRODIURIL) 25 MG tablet Take 1 tablet (25 mg total) by mouth daily.  90 tablet  3  .  HYDROcodone-acetaminophen (NORCO) 5-325 MG per tablet Take 1 tablet by mouth every 6 (six) hours as needed. For pain  120 tablet  1  . losartan (COZAAR) 100 MG tablet Take 1 tablet (100 mg total) by mouth daily.  90 tablet  3  . meloxicam (MOBIC) 15 MG tablet Take 1 tablet (15 mg total) by mouth daily.  30 tablet  1  . Multiple Vitamins-Calcium (ONE-A-DAY WOMENS PO) Take by mouth daily.      Marland Kitchen omega-3 acid ethyl esters (LOVAZA) 1 G capsule Take 2 g by mouth daily.      . potassium chloride SA (K-DUR,KLOR-CON) 20 MEQ tablet Take 1 tablet (20 mEq total) by mouth daily.  30 tablet  1  . zafirlukast (ACCOLATE) 20 MG tablet TAKE 1 TABLET BY MOUTH TWICE DAILY.  60 tablet  5      Review of Systems System review is negative for any constitutional, cardiac, pulmonary, GI or neuro symptoms or complaints other than as described in the HPI.     Objective:   Physical Exam Filed Vitals:   11/17/11 1719  BP: 110/78  Pulse: 98  Temp: 99.4 F (37.4 C)  Resp: 16   Wt Readings from Last 3 Encounters:  11/17/11 266 lb (120.657 kg)  08/01/11 266 lb (120.657 kg)  07/26/11 281 lb 8.4 oz (127.7 kg)   Gen'l- obese AA woman in no distress Cor- RRR Pulm - normal respirations Abd - obese Derm - a papular, dark lesion RLQ w/o erythema, drainage, excoriation. Similar second lesion cephalad to first lesion.       Assessment & Plan:  Skin lesions - NOT zoster. 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   Plan- normal hygienic care.

## 2011-12-22 ENCOUNTER — Telehealth: Payer: Self-pay | Admitting: Internal Medicine

## 2012-01-19 NOTE — Telephone Encounter (Signed)
Received

## 2012-02-20 ENCOUNTER — Other Ambulatory Visit: Payer: Self-pay | Admitting: *Deleted

## 2012-02-20 MED ORDER — ZAFIRLUKAST 20 MG PO TABS: 20.0000 mg | ORAL_TABLET | Freq: Two times a day (BID) | ORAL | Status: DC

## 2012-05-08 ENCOUNTER — Other Ambulatory Visit (HOSPITAL_COMMUNITY): Payer: Self-pay | Admitting: Internal Medicine

## 2012-05-08 ENCOUNTER — Other Ambulatory Visit: Payer: Self-pay | Admitting: *Deleted

## 2012-05-16 ENCOUNTER — Other Ambulatory Visit: Payer: Self-pay | Admitting: *Deleted

## 2012-05-16 MED ORDER — CARVEDILOL 25 MG PO TABS: 25.0000 mg | ORAL_TABLET | Freq: Two times a day (BID) | ORAL | Status: DC

## 2012-06-19 ENCOUNTER — Other Ambulatory Visit: Payer: Self-pay

## 2012-06-19 MED ORDER — CARVEDILOL 25 MG PO TABS: 25.0000 mg | ORAL_TABLET | Freq: Two times a day (BID) | ORAL | Status: DC

## 2012-07-24 ENCOUNTER — Other Ambulatory Visit: Payer: Self-pay | Admitting: Internal Medicine

## 2012-08-21 ENCOUNTER — Other Ambulatory Visit: Payer: Self-pay

## 2012-08-21 MED ORDER — FORMOTEROL FUMARATE 12 MCG IN CAPS: 12.0000 ug | ORAL_CAPSULE | Freq: Two times a day (BID) | RESPIRATORY_TRACT | Status: DC

## 2012-08-23 ENCOUNTER — Ambulatory Visit (INDEPENDENT_AMBULATORY_CARE_PROVIDER_SITE_OTHER): Payer: BC Managed Care – PPO | Admitting: Internal Medicine

## 2012-08-23 ENCOUNTER — Other Ambulatory Visit: Payer: Self-pay | Admitting: Internal Medicine

## 2012-08-23 ENCOUNTER — Encounter: Payer: Self-pay | Admitting: Internal Medicine

## 2012-08-23 VITALS — BP 150/80 | HR 88 | Temp 98.4°F | Ht 72.0 in | Wt 277.8 lb

## 2012-08-23 DIAGNOSIS — J069 Acute upper respiratory infection, unspecified: Secondary | ICD-10-CM

## 2012-08-23 MED ORDER — PROMETHAZINE-CODEINE 6.25-10 MG/5ML PO SYRP: 5.0000 mL | ORAL_SOLUTION | ORAL | Status: DC | PRN

## 2012-08-23 NOTE — Patient Instructions (Addendum)
Viral URI with cough - no evidence of bacterial infection, thus no need for antibiotics.  Plan Robitussin DM or the equivalent every 4 hours - very safe. Avoid decongestants: pseudoephedrine, phenylephrine, et  At night: phenergan with codeine 1 tsp every 6 hours and may take 2 tsps at bedtime   Hydrate, tylenol for any fever or aches, vitamin C, ecchinacea.

## 2012-08-23 NOTE — Progress Notes (Signed)
Subjective:    Patient ID: Gabriella Nguyen, female    DOB: [birth year], 57 y.o.   MRN: [id number]  HPI Gabriella Nguyen reports onset of symptoms Sunday-sore throat which resolved followed by cough which has interfered with sleep. Sputum is scant and yellow w/o blood. No fever. She is having some post-nasal drainage.    Past Medical History  Diagnosis Date  . ASTHMA   . OSTEOARTHRITIS, KNEES, BILATERAL   . Unspecified essential hypertension   . Aspiration pneumonia 06/2011 hosp   History reviewed. No pertinent past surgical history. History reviewed. No pertinent family history. History   Social History  . Marital Status: Single    Spouse Name: N/A    Number of Children: N/A  . Years of Education: N/A   Occupational History  . Not on file.   Social History Main Topics  . Smoking status: Former Games developer  . Smokeless tobacco: Not on file  . Alcohol Use: Yes  . Drug Use: No  . Sexually Active: Not on file   Other Topics Concern  . Not on file   Social History Narrative  . No narrative on file   Current Outpatient Prescriptions on File Prior to Visit  Medication Sig Dispense Refill  . acetaminophen (TYLENOL) 325 MG tablet Take 650 mg by mouth every 6 (six) hours as needed. For pain      . albuterol (PROVENTIL HFA;VENTOLIN HFA) 108 (90 BASE) MCG/ACT inhaler Inhale 2 puffs into the lungs every 6 (six) hours as needed for wheezing.  1 Inhaler  5  . albuterol (PROVENTIL,VENTOLIN) 90 MCG/ACT inhaler Inhale 2 puffs into the lungs every 6 (six) hours as needed.      . carvedilol (COREG) 25 MG tablet TAKE 1 TABLET BY MOUTH TWICE A DAY WITH A MEAL  60 tablet  0  . formoterol (FORADIL AEROLIZER) 12 MCG capsule for inhaler Place 1 capsule (12 mcg total) into inhaler and inhale 2 (two) times daily.  180 capsule  3  . Glucosamine 500 MG TABS Take by mouth daily.      Marland Kitchen HYDROcodone-acetaminophen (NORCO) 5-325 MG per tablet Take 1 tablet by mouth every 6 (six) hours as needed. For pain  120 tablet   1  . meloxicam (MOBIC) 15 MG tablet Take 1 tablet (15 mg total) by mouth daily.  30 tablet  1  . Multiple Vitamins-Calcium (ONE-A-DAY WOMENS PO) Take by mouth daily.      Marland Kitchen omega-3 acid ethyl esters (LOVAZA) 1 G capsule Take 2 g by mouth daily.      . zafirlukast (ACCOLATE) 20 MG tablet Take 1 tablet (20 mg total) by mouth 2 (two) times daily.  60 tablet  5  . potassium chloride SA (K-DUR,KLOR-CON) 20 MEQ tablet Take 1 tablet (20 mEq total) by mouth daily.  30 tablet  1   No current facility-administered medications on file prior to visit.       Review of Systems System review is negative for any constitutional, cardiac, pulmonary, GI or neuro symptoms or complaints other than as described in the HPI.     Objective:   Physical Exam Filed Vitals:   08/23/12 1151  BP: 150/80  Pulse: 88  Temp: 98.4 F (36.9 C)   Wt Readings from Last 3 Encounters:  08/23/12 277 lb 12.8 oz (126.009 kg)  11/17/11 266 lb (120.657 kg)  08/01/11 266 lb (120.657 kg)   BP Readings from Last 3 Encounters:  08/23/12 150/80  11/17/11 110/78  09/20/11 140/82  Gen'- Heavy set AA woman in no distress HEENT- C&S clear, no sinus tenderness to percussion, throat clear, TMs normal Nodes - negative Cor - RRR Pulm - CTAP Neuro - non-focal       Assessment & Plan:  Viral URI with cough - no evidence of bacterial infection, thus no need for antibiotics.  Plan Robitussin DM or the equivalent every 4 hours - very safe. Avoid decongestants: pseudoephedrine, phenylephrine, et  At night: phenergan with codeine 1 tsp every 6 hours and may take 2 tsps at bedtime   Hydrate, tylenol for any fever or aches, vitamin C, ecchinacea.

## 2012-08-24 ENCOUNTER — Telehealth: Payer: Self-pay

## 2012-08-24 NOTE — Telephone Encounter (Signed)
Phone call from patient stating she was seen yesterday for sore throat and cough and trouble sleeping because of this. She is requesting a work excuse for today and tomorrow. This has been printed and waiting for your signature.

## 2012-08-24 NOTE — Telephone Encounter (Signed)
Will sign this afternoon

## 2012-08-31 ENCOUNTER — Other Ambulatory Visit: Payer: Self-pay | Admitting: Internal Medicine

## 2012-09-03 NOTE — Telephone Encounter (Signed)
Hydrocodone sent to pharmacy

## 2012-09-21 ENCOUNTER — Other Ambulatory Visit: Payer: Self-pay | Admitting: Internal Medicine

## 2012-11-05 ENCOUNTER — Other Ambulatory Visit: Payer: Self-pay | Admitting: Internal Medicine

## 2012-11-20 ENCOUNTER — Telehealth: Payer: Self-pay | Admitting: *Deleted

## 2012-11-20 NOTE — Telephone Encounter (Signed)
[facility] Triage Call Report Triage Record Num: [id number] Operator: Maryfrances Bunnell Patient Name: Gabriella Nguyen Call Date & Time: 11/19/2012 10:33:38PM Patient Phone: [phone] PCP: Illene Regulus Patient Gender: Female PCP Fax : [phone] Patient DOB: [birth year] [facility] Reason for Call: Caller: Kishana/Patient; PCP: Illene Regulus (Adults only); CB#: [phone]; Call regarding Motorized Wheelchair Rolled Over Right Foot; Heavy, large w/c rolled over right foot today at work; Can move toes and move foot; Slight swelling and bruising from 3rd toe down, over top of foot; Ibuprofen taken today 400mg  with some relief; Per Foot Injury Protocol "Mild to moderate pain with weight bearing or walking and stops with rest." Home care advice given. Ibuprofen adult dose reviewed. Protocol(s) Used: Foot Injury Recommended Outcome per Protocol: Provide Home/Self Care Reason for Outcome: Mild to moderate pain with weight bearing or walking and stops with rest Care Advice: ~ Avoid strenuous exercise of affected joint/muscles. ~ Remove any rings on the toes of the affected foot, if possible. Apply cloth-covered ice pack or a cool compress to the area for no more than 20 minutes 4-8 times a day while awake to reduce pain and swelling. ~ ~ See provider if pain continues for 7 days with home care. ~ SYMPTOM / CONDITION MANAGEMENT Analgesic/Antipyretic Advice - NSAIDs: Consider aspirin, ibuprofen, naproxen or ketoprofen for pain or fever as directed on label or by pharmacist/provider. PRECAUTIONS: - If over 19 years of age, should not take longer than 1 week without consulting provider. EXCEPTIONS: - Should not be used if taking blood thinners or have bleeding problems. - Do not use if have history of sensitivity/allergy to any of these medications; or history of cardiovascular, ulcer, kidney, liver disease or diabetes unless approved by provider. - Do not exceed  recommended dose or frequency. ~ 08/

## 2012-12-02 ENCOUNTER — Other Ambulatory Visit: Payer: Self-pay | Admitting: Internal Medicine

## 2013-03-05 ENCOUNTER — Other Ambulatory Visit: Payer: Self-pay | Admitting: Internal Medicine

## 2013-06-20 ENCOUNTER — Other Ambulatory Visit: Payer: Self-pay | Admitting: Internal Medicine

## 2013-07-30 ENCOUNTER — Other Ambulatory Visit: Payer: Self-pay

## 2013-07-30 MED ORDER — LOSARTAN POTASSIUM 100 MG PO TABS: 100.0000 mg | ORAL_TABLET | Freq: Every day | ORAL | Status: DC

## 2013-09-03 ENCOUNTER — Other Ambulatory Visit: Payer: Self-pay | Admitting: Internal Medicine

## 2014-01-24 ENCOUNTER — Ambulatory Visit (INDEPENDENT_AMBULATORY_CARE_PROVIDER_SITE_OTHER): Payer: BC Managed Care – PPO | Admitting: Family

## 2014-01-24 ENCOUNTER — Encounter: Payer: Self-pay | Admitting: Family

## 2014-01-24 VITALS — BP 148/90 | HR 84 | Temp 98.5°F | Resp 18 | Ht 74.0 in | Wt 259.0 lb

## 2014-01-24 DIAGNOSIS — B3731 Acute candidiasis of vulva and vagina: Secondary | ICD-10-CM | POA: Insufficient documentation

## 2014-01-24 DIAGNOSIS — B373 Candidiasis of vulva and vagina: Secondary | ICD-10-CM

## 2014-01-24 MED ORDER — HYDROCODONE-ACETAMINOPHEN 5-325 MG PO TABS: ORAL_TABLET | ORAL | Status: DC

## 2014-01-24 MED ORDER — ZAFIRLUKAST 20 MG PO TABS: ORAL_TABLET | ORAL | Status: DC

## 2014-01-24 MED ORDER — FLUCONAZOLE 150 MG PO TABS: 150.0000 mg | ORAL_TABLET | Freq: Once | ORAL | Status: DC

## 2014-01-24 MED ORDER — HYDROCHLOROTHIAZIDE 25 MG PO TABS: ORAL_TABLET | ORAL | Status: DC

## 2014-01-24 NOTE — Patient Instructions (Signed)
Thank you for choosing [facility].  Summary/Instructions:   Your prescriptions have been sent to your pharmacy  Please follow up if your symptoms worsen or fail to improve  Please make a follow up appointment to establish care and discuss your blood pressure.    Candidal Vulvovaginitis Candidal vulvovaginitis is an infection of the vagina and vulva. The vulva is the skin around the opening of the vagina. This may cause itching and discomfort in and around the vagina.  HOME CARE  Only take medicine as told by your doctor.  Do not have sex (intercourse) until the infection is healed or as told by your doctor.  Practice safe sex.  Tell your sex partner about your infection.  Do not douche or use tampons.  Wear cotton underwear. Do not wear tight pants or panty hose.  Eat yogurt. This may help treat and prevent yeast infections. GET HELP RIGHT AWAY IF:   You have a fever.  Your problems get worse during treatment or do not get better in 3 days.  You have discomfort, irritation, or itching in your vagina or vulva area.  You have pain after sex.  You start to get belly (abdominal) pain. MAKE SURE YOU:  Understand these instructions.  Will watch your condition.  Will get help right away if you are not doing well or get worse. Document Released: 06/10/2008 Document Revised: 03/19/2013 Document Reviewed: 06/10/2008 [facility] Patient Information 2015 [place], [place]. This information is not intended to replace advice given to you by your health care provider. Make sure you discuss any questions you have with your health care provider.

## 2014-01-24 NOTE — Assessment & Plan Note (Signed)
Symptoms consistent with candida vaginitis. Start Diflucan 150 mg once. Follow up if symptoms worsen or fail to improve.

## 2014-01-24 NOTE — Progress Notes (Signed)
Pre visit review using our clinic review tool, if applicable. No additional management support is needed unless otherwise documented below in the visit note. 

## 2014-01-24 NOTE — Progress Notes (Signed)
   Subjective:    Patient ID: Gabriella LopesRoberta Marcello, female    DOB: [birth year], 58 y.o.   MRN: [id number]  Chief Complaint  Patient presents with  . Vaginitis    x2 weeks, slight itching and discharge    HPI:  Gabriella Nguyen is a 58 y.o. female who presents today for a yeast infection.   Has noticed an odor with some discharge. Has been going for about 2 weeks, it started as itching and has noticed some discharge, but it is not bad. Denies any unprotected intercourse in the last 2 weeks. Denies fevers or chills. Feels like similar candidiasis she previously had.   Allergies  Allergen Reactions  . Nifedipine    Current Outpatient Prescriptions on File Prior to Visit  Medication Sig Dispense Refill  . acetaminophen (TYLENOL) 325 MG tablet Take 650 mg by mouth every 6 (six) hours as needed. For pain      . albuterol (PROVENTIL,VENTOLIN) 90 MCG/ACT inhaler Inhale 2 puffs into the lungs every 6 (six) hours as needed.      . carvedilol (COREG) 25 MG tablet TAKE 1 TABLET BY MOUTH TWICE A DAY WITH A MEAL  60 tablet  0  . formoterol (FORADIL AEROLIZER) 12 MCG capsule for inhaler Place 1 capsule (12 mcg total) into inhaler and inhale 2 (two) times daily.  180 capsule  3  . Glucosamine 500 MG TABS Take by mouth daily.      Marland Kitchen. losartan (COZAAR) 100 MG tablet TAKE 1 TABLET (100 MG TOTAL) BY MOUTH DAILY.  90 tablet  0  . Multiple Vitamins-Calcium (ONE-A-DAY WOMENS PO) Take by mouth daily.      Marland Kitchen. omega-3 acid ethyl esters (LOVAZA) 1 G capsule Take 2 g by mouth daily.      . promethazine-codeine (PHENERGAN WITH CODEINE) 6.25-10 MG/5ML syrup TAKE 5ML'S BY MOUTH EVERY 4 HOURS AS NEEDED FOR COUGH  120 mL  0  . VENTOLIN HFA 108 (90 BASE) MCG/ACT inhaler INHALE 2 PUFFS INTO THE LUNGS EVERY 6 HOURS AS NEEDED FOR WHEEZING.  18 each  2  . potassium chloride SA (K-DUR,KLOR-CON) 20 MEQ tablet Take 1 tablet (20 mEq total) by mouth daily.  30 tablet  1   No current facility-administered medications on file prior to  visit.    Review of Systems    See HPI.  Objective:    BP 148/90  Pulse 84  Temp(Src) 98.5 F (36.9 C) (Oral)  Resp 18  Ht 6\' 2"  (1.88 m)  Wt 259 lb (117.482 kg)  BMI 33.24 kg/m2  SpO2 96% Nursing note and vital signs reviewed.  Physical Exam  Constitutional: She is oriented to person, place, and time. She appears well-developed and well-nourished. No distress.  Cardiovascular: Normal rate, regular rhythm and normal heart sounds.   Pulmonary/Chest: Effort normal and breath sounds normal.  Neurological: She is alert and oriented to person, place, and time.  Skin: Skin is warm and dry.  Psychiatric: She has a normal mood and affect. Her behavior is normal. Judgment and thought content normal.       Assessment & Plan:

## 2014-01-27 LAB — HM MAMMOGRAPHY

## 2014-03-13 ENCOUNTER — Ambulatory Visit (INDEPENDENT_AMBULATORY_CARE_PROVIDER_SITE_OTHER): Payer: BC Managed Care – PPO | Admitting: Family

## 2014-03-13 ENCOUNTER — Encounter: Payer: Self-pay | Admitting: Family

## 2014-03-13 VITALS — BP 140/80 | HR 82 | Temp 98.3°F | Resp 18 | Ht 72.0 in | Wt 252.8 lb

## 2014-03-13 DIAGNOSIS — J4520 Mild intermittent asthma, uncomplicated: Secondary | ICD-10-CM

## 2014-03-13 DIAGNOSIS — I10 Essential (primary) hypertension: Secondary | ICD-10-CM

## 2014-03-13 NOTE — Assessment & Plan Note (Signed)
Blood pressure remained stable with current regimen. Continue carvedilol, hydrochlorothiazide, and losartan. Follow up in approximately 6 months or sooner if needed.

## 2014-03-13 NOTE — Progress Notes (Signed)
Subjective:    Patient ID: Gabriella Nguyen, female    DOB: 1955-11-10, 58 y.o.   MRN: [id number]  Chief Complaint  Patient presents with  . Establish Care    no issues    HPI:  Gabriella Nguyen is a 58 y.o. female who presents today to establish care with this provider and discuss hypertension.   1) Hypertension - carvedilol, HCTZ and losartan. Denies headaches, changes in vision, shortness of breath, chest pain/discomfort or edema.  BP Readings from Last 3 Encounters:  03/13/14 140/80  01/24/14 148/90  08/23/12 150/80    2) Asthma - Not having to use her albuterol often. Mainly at work when she is exposed to tobacco smoke. Currently maintained on albuterol, formoterol and zafirlukast.   Allergies  Allergen Reactions  . Nifedipine     Current Outpatient Prescriptions on File Prior to Visit  Medication Sig Dispense Refill  . acetaminophen (TYLENOL) 325 MG tablet Take 650 mg by mouth every 6 (six) hours as needed. For pain    . albuterol (PROVENTIL,VENTOLIN) 90 MCG/ACT inhaler Inhale 2 puffs into the lungs every 6 (six) hours as needed.    . carvedilol (COREG) 25 MG tablet TAKE 1 TABLET BY MOUTH TWICE A DAY WITH A MEAL 60 tablet 0  . fluconazole (DIFLUCAN) 150 MG tablet Take 1 tablet (150 mg total) by mouth once. 3 tablet 0  . formoterol (FORADIL AEROLIZER) 12 MCG capsule for inhaler Place 1 capsule (12 mcg total) into inhaler and inhale 2 (two) times daily. 180 capsule 3  . Glucosamine 500 MG TABS Take by mouth daily.    . hydrochlorothiazide (HYDRODIURIL) 25 MG tablet TAKE ONE (1) TABLET EACH DAY 30 tablet 0  . HYDROcodone-acetaminophen (NORCO/VICODIN) 5-325 MG per tablet TAKE 1 TABLET BY MOUTH TWICE DAILY 60 tablet 0  . losartan (COZAAR) 100 MG tablet TAKE 1 TABLET (100 MG TOTAL) BY MOUTH DAILY. 90 tablet 0  . Multiple Vitamins-Calcium (ONE-A-DAY WOMENS PO) Take by mouth daily.    Marland Kitchen. omega-3 acid ethyl esters (LOVAZA) 1 G capsule Take 2 g by mouth daily.    .  promethazine-codeine (PHENERGAN WITH CODEINE) 6.25-10 MG/5ML syrup TAKE 5ML'S BY MOUTH EVERY 4 HOURS AS NEEDED FOR COUGH 120 mL 0  . VENTOLIN HFA 108 (90 BASE) MCG/ACT inhaler INHALE 2 PUFFS INTO THE LUNGS EVERY 6 HOURS AS NEEDED FOR WHEEZING. 18 each 2  . zafirlukast (ACCOLATE) 20 MG tablet TAKE ONE TABLET TWICE DAILY 60 tablet 5  . potassium chloride SA (K-DUR,KLOR-CON) 20 MEQ tablet Take 1 tablet (20 mEq total) by mouth daily. 30 tablet 1   No current facility-administered medications on file prior to visit.    Review of Systems    See HPI  Objective:    BP 140/80 mmHg  Pulse 82  Temp(Src) 98.3 F (36.8 C) (Oral)  Resp 18  Ht 6' (1.829 m)  Wt 252 lb 12.8 oz (114.669 kg)  BMI 34.28 kg/m2  SpO2 97% Nursing note and vital signs reviewed.  Physical Exam  Constitutional: She is oriented to person, place, and time. She appears well-developed and well-nourished. No distress.  Cardiovascular: Normal rate, regular rhythm, normal heart sounds and intact distal pulses.   Pulmonary/Chest: Effort normal and breath sounds normal.  Neurological: She is alert and oriented to person, place, and time.  Skin: Skin is warm and dry.  Psychiatric: She has a normal mood and affect. Her behavior is normal. Judgment and thought content normal.       Assessment &  Plan:

## 2014-03-13 NOTE — Patient Instructions (Signed)
Thank you for choosing [facility].  Summary/Instructions:  Please continue taking your medications as prescribed.  Health Maintenance Adopting a healthy lifestyle and getting preventive care can go a long way to promote health and wellness. Talk with your health care provider about what schedule of regular examinations is right for you. This is a good chance for you to check in with your provider about disease prevention and staying healthy. In between checkups, there are plenty of things you can do on your own. Experts have done a lot of research about which lifestyle changes and preventive measures are most likely to keep you healthy. Ask your health care provider for more information. WEIGHT AND DIET  Eat a healthy diet  Be sure to include plenty of vegetables, fruits, low-fat dairy products, and lean protein.  Do not eat a lot of foods high in solid fats, added sugars, or salt.  Get regular exercise. This is one of the most important things you can do for your health.  Most adults should exercise for at least 150 minutes each week. The exercise should increase your heart rate and make you sweat (moderate-intensity exercise).  Most adults should also do strengthening exercises at least twice a week. This is in addition to the moderate-intensity exercise.  Maintain a healthy weight  Body mass index (BMI) is a measurement that can be used to identify possible weight problems. It estimates body fat based on height and weight. Your health care provider can help determine your BMI and help you achieve or maintain a healthy weight.  For females 70 years of age and older:   A BMI below 18.5 is considered underweight.  A BMI of 18.5 to 24.9 is normal.  A BMI of 25 to 29.9 is considered overweight.  A BMI of 30 and above is considered obese.  Watch levels of cholesterol and blood lipids  You should start having your blood tested for lipids and cholesterol at 59 years of age,  then have this test every 5 years.  You may need to have your cholesterol levels checked more often if:  Your lipid or cholesterol levels are high.  You are older than 59 years of age.  You are at high risk for heart disease.  CANCER SCREENING   Lung Cancer  Lung cancer screening is recommended for adults 71-46 years old who are at high risk for lung cancer because of a history of smoking.  A yearly low-dose CT scan of the lungs is recommended for people who:  Currently smoke.  Have quit within the past 15 years.  Have at least a 30-pack-year history of smoking. A pack year is smoking an average of one pack of cigarettes a day for 1 year.  Yearly screening should continue until it has been 15 years since you quit.  Yearly screening should stop if you develop a health problem that would prevent you from having lung cancer treatment.  Breast Cancer  Practice breast self-awareness. This means understanding how your breasts normally appear and feel.  It also means doing regular breast self-exams. Let your health care provider know about any changes, no matter how small.  If you are in your 20s or 30s, you should have a clinical breast exam (CBE) by a health care provider every 1-3 years as part of a regular health exam.  If you are 32 or older, have a CBE every year. Also consider having a breast X-ray (mammogram) every year.  If you have a family  history of breast cancer, talk to your health care provider about genetic screening.  If you are at high risk for breast cancer, talk to your health care provider about having an MRI and a mammogram every year.  Breast cancer gene (BRCA) assessment is recommended for women who have family members with BRCA-related cancers. BRCA-related cancers include:  Breast.  Ovarian.  Tubal.  Peritoneal cancers.  Results of the assessment will determine the need for genetic counseling and BRCA1 and BRCA2 testing. Cervical  Cancer Routine pelvic examinations to screen for cervical cancer are no longer recommended for nonpregnant women who are considered low risk for cancer of the pelvic organs (ovaries, uterus, and vagina) and who do not have symptoms. A pelvic examination may be necessary if you have symptoms including those associated with pelvic infections. Ask your health care provider if a screening pelvic exam is right for you.   The Pap test is the screening test for cervical cancer for women who are considered at risk.  If you had a hysterectomy for a problem that was not cancer or a condition that could lead to cancer, then you no longer need Pap tests.  If you are older than 65 years, and you have had normal Pap tests for the past 10 years, you no longer need to have Pap tests.  If you have had past treatment for cervical cancer or a condition that could lead to cancer, you need Pap tests and screening for cancer for at least 20 years after your treatment.  If you no longer get a Pap test, assess your risk factors if they change (such as having a new sexual partner). This can affect whether you should start being screened again.  Some women have medical problems that increase their chance of getting cervical cancer. If this is the case for you, your health care provider may recommend more frequent screening and Pap tests.  The human papillomavirus (HPV) test is another test that may be used for cervical cancer screening. The HPV test looks for the virus that can cause cell changes in the cervix. The cells collected during the Pap test can be tested for HPV.  The HPV test can be used to screen women 72 years of age and older. Getting tested for HPV can extend the interval between normal Pap tests from three to five years.  An HPV test also should be used to screen women of any age who have unclear Pap test results.  After 59 years of age, women should have HPV testing as often as Pap tests.  Colorectal  Cancer  This type of cancer can be detected and often prevented.  Routine colorectal cancer screening usually begins at 59 years of age and continues through 59 years of age.  Your health care provider may recommend screening at an earlier age if you have risk factors for colon cancer.  Your health care provider may also recommend using home test kits to check for hidden blood in the stool.  A small camera at the end of a tube can be used to examine your colon directly (sigmoidoscopy or colonoscopy). This is done to check for the earliest forms of colorectal cancer.  Routine screening usually begins at age 13.  Direct examination of the colon should be repeated every 5-10 years through 59 years of age. However, you may need to be screened more often if early forms of precancerous polyps or small growths are found. Skin Cancer  Check your skin from  head to toe regularly.  Tell your health care provider about any new moles or changes in moles, especially if there is a change in a mole's shape or color.  Also tell your health care provider if you have a mole that is larger than the size of a pencil eraser.  Always use sunscreen. Apply sunscreen liberally and repeatedly throughout the day.  Protect yourself by wearing long sleeves, pants, a wide-brimmed hat, and sunglasses whenever you are outside. HEART DISEASE, DIABETES, AND HIGH BLOOD PRESSURE   Have your blood pressure checked at least every 1-2 years. High blood pressure causes heart disease and increases the risk of stroke.  If you are between 59 years and 42 years old, ask your health care provider if you should take aspirin to prevent strokes.  Have regular diabetes screenings. This involves taking a blood sample to check your fasting blood sugar level.  If you are at a normal weight and have a low risk for diabetes, have this test once every three years after 59 years of age.  If you are overweight and have a high risk for  diabetes, consider being tested at a younger age or more often. PREVENTING INFECTION  Hepatitis B  If you have a higher risk for hepatitis B, you should be screened for this virus. You are considered at high risk for hepatitis B if:  You were born in a country where hepatitis B is common. Ask your health care provider which countries are considered high risk.  Your parents were born in a high-risk country, and you have not been immunized against hepatitis B (hepatitis B vaccine).  You have HIV or AIDS.  You use needles to inject street drugs.  You live with someone who has hepatitis B.  You have had sex with someone who has hepatitis B.  You get hemodialysis treatment.  You take certain medicines for conditions, including cancer, organ transplantation, and autoimmune conditions. Hepatitis C  Blood testing is recommended for:  Everyone born from [birth year] through 1966.  Anyone with known risk factors for hepatitis C. Sexually transmitted infections (STIs)  You should be screened for sexually transmitted infections (STIs) including gonorrhea and chlamydia if:  You are sexually active and are younger than 59 years of age.  You are older than 59 years of age and your health care provider tells you that you are at risk for this type of infection.  Your sexual activity has changed since you were last screened and you are at an increased risk for chlamydia or gonorrhea. Ask your health care provider if you are at risk.  If you do not have HIV, but are at risk, it may be recommended that you take a prescription medicine daily to prevent HIV infection. This is called pre-exposure prophylaxis (PrEP). You are considered at risk if:  You are sexually active and do not regularly use condoms or know the HIV status of your partner(s).  You take drugs by injection.  You are sexually active with a partner who has HIV. Talk with your health care provider about whether you are at high risk of  being infected with HIV. If you choose to begin PrEP, you should first be tested for HIV. You should then be tested every 3 months for as long as you are taking PrEP.  PREGNANCY   If you are premenopausal and you may become pregnant, ask your health care provider about preconception counseling.  If you may become pregnant, take 400 to 800  micrograms (mcg) of folic acid every day.  If you want to prevent pregnancy, talk to your health care provider about birth control (contraception). OSTEOPOROSIS AND MENOPAUSE   Osteoporosis is a disease in which the bones lose minerals and strength with aging. This can result in serious bone fractures. Your risk for osteoporosis can be identified using a bone density scan.  If you are 9 years of age or older, or if you are at risk for osteoporosis and fractures, ask your health care provider if you should be screened.  Ask your health care provider whether you should take a calcium or vitamin D supplement to lower your risk for osteoporosis.  Menopause may have certain physical symptoms and risks.  Hormone replacement therapy may reduce some of these symptoms and risks. Talk to your health care provider about whether hormone replacement therapy is right for you.  HOME CARE INSTRUCTIONS   Schedule regular health, dental, and eye exams.  Stay current with your immunizations.   Do not use any tobacco products including cigarettes, chewing tobacco, or electronic cigarettes.  If you are pregnant, do not drink alcohol.  If you are breastfeeding, limit how much and how often you drink alcohol.  Limit alcohol intake to no more than 1 drink per day for nonpregnant women. One drink equals 12 ounces of beer, 5 ounces of wine, or 1 ounces of hard liquor.  Do not use street drugs.  Do not share needles.  Ask your health care provider for help if you need support or information about quitting drugs.  Tell your health care provider if you often feel  depressed.  Tell your health care provider if you have ever been abused or do not feel safe at home. Document Released: 09/27/2010 Document Revised: 07/29/2013 Document Reviewed: 02/13/2013 [facility] Patient Information 2015 [place], [place]. This information is not intended to replace advice given to you by your health care provider. Make sure you discuss any questions you have with your health care provider.

## 2014-03-13 NOTE — Progress Notes (Signed)
Pre visit review using our clinic review tool, if applicable. No additional management support is needed unless otherwise documented below in the visit note. 

## 2014-03-13 NOTE — Assessment & Plan Note (Signed)
Asthma remains stable at this time with exacerbations caused by tobacco smoke which she encounters at her place of employment. Has not had to use her inhaler recently. Continue current regimen of albuterol, formoterol and zafirlukast.

## 2014-03-14 ENCOUNTER — Telehealth: Payer: Self-pay | Admitting: Internal Medicine

## 2014-03-14 NOTE — Telephone Encounter (Signed)
emmi emailed °

## 2014-04-03 ENCOUNTER — Other Ambulatory Visit: Payer: Self-pay | Admitting: Family

## 2014-04-21 ENCOUNTER — Encounter: Payer: Self-pay | Admitting: Family

## 2014-04-21 ENCOUNTER — Ambulatory Visit (INDEPENDENT_AMBULATORY_CARE_PROVIDER_SITE_OTHER): Payer: BLUE CROSS/BLUE SHIELD | Admitting: Family

## 2014-04-21 VITALS — BP 144/82 | HR 85 | Temp 98.8°F | Resp 18 | Ht 72.0 in | Wt 250.1 lb

## 2014-04-21 DIAGNOSIS — S060X0A Concussion without loss of consciousness, initial encounter: Secondary | ICD-10-CM

## 2014-04-21 DIAGNOSIS — S060X9A Concussion with loss of consciousness of unspecified duration, initial encounter: Secondary | ICD-10-CM | POA: Insufficient documentation

## 2014-04-21 DIAGNOSIS — S060XAA Concussion with loss of consciousness status unknown, initial encounter: Secondary | ICD-10-CM | POA: Insufficient documentation

## 2014-04-21 NOTE — Progress Notes (Signed)
Pre visit review using our clinic review tool, if applicable. No additional management support is needed unless otherwise documented below in the visit note. 

## 2014-04-21 NOTE — Patient Instructions (Addendum)
Thank you for choosing [facility].  Summary/Instructions:  If your symptoms worsen or fail to improve, please contact our office for further instruction, or in case of emergency go directly to the emergency room at the closest medical facility.   Concussion A concussion, or closed-head injury, is a brain injury caused by a direct blow to the head or by a quick and sudden movement (jolt) of the head or neck. Concussions are usually not life-threatening. Even so, the effects of a concussion can be serious. If you have had a concussion before, you are more likely to experience concussion-like symptoms after a direct blow to the head.  CAUSES  Direct blow to the head, such as from running into another player during a soccer game, being hit in a fight, or hitting your head on a hard surface.  A jolt of the head or neck that causes the brain to move back and forth inside the skull, such as in a car crash. SIGNS AND SYMPTOMS The signs of a concussion can be hard to notice. Early on, they may be missed by you, family members, and health care providers. You may look fine but act or feel differently. Symptoms are usually temporary, but they may last for days, weeks, or even longer. Some symptoms may appear right away while others may not show up for hours or days. Every head injury is different. Symptoms include:  Mild to moderate headaches that will not go away.  A feeling of pressure inside your head.  Having more trouble than usual:  Learning or remembering things you have heard.  Answering questions.  Paying attention or concentrating.  Organizing daily tasks.  Making decisions and solving problems.  Slowness in thinking, acting or reacting, speaking, or reading.  Getting lost or being easily confused.  Feeling tired all the time or lacking energy (fatigued).  Feeling drowsy.  Sleep disturbances.  Sleeping more than usual.  Sleeping less than usual.  Trouble falling  asleep.  Trouble sleeping (insomnia).  Loss of balance or feeling lightheaded or dizzy.  Nausea or vomiting.  Numbness or tingling.  Increased sensitivity to:  Sounds.  Lights.  Distractions.  Vision problems or eyes that tire easily.  Diminished sense of taste or smell.  Ringing in the ears.  Mood changes such as feeling sad or anxious.  Becoming easily irritated or angry for little or no reason.  Lack of motivation.  Seeing or hearing things other people do not see or hear (hallucinations). DIAGNOSIS Your health care provider can usually diagnose a concussion based on a description of your injury and symptoms. He or she will ask whether you passed out (lost consciousness) and whether you are having trouble remembering events that happened right before and during your injury. Your evaluation might include:  A brain scan to look for signs of injury to the brain. Even if the test shows no injury, you may still have a concussion.  Blood tests to be sure other problems are not present. TREATMENT  Concussions are usually treated in an emergency department, in urgent care, or at a clinic. You may need to stay in the hospital overnight for further treatment.  Tell your health care provider if you are taking any medicines, including prescription medicines, over-the-counter medicines, and natural remedies. Some medicines, such as blood thinners (anticoagulants) and aspirin, may increase the chance of complications. Also tell your health care provider whether you have had alcohol or are taking illegal drugs. This information may affect treatment.  Your health care provider will send you home with important instructions to follow.  How fast you will recover from a concussion depends on many factors. These factors include how severe your concussion is, what part of your brain was injured, your age, and how healthy you were before the concussion.  Most people with mild injuries  recover fully. Recovery can take time. In general, recovery is slower in older persons. Also, persons who have had a concussion in the past or have other medical problems may find that it takes longer to recover from their current injury. HOME CARE INSTRUCTIONS General Instructions  Carefully follow the directions your health care provider gave you.  Only take over-the-counter or prescription medicines for pain, discomfort, or fever as directed by your health care provider.  Take only those medicines that your health care provider has approved.  Do not drink alcohol until your health care provider says you are well enough to do so. Alcohol and certain other drugs may slow your recovery and can put you at risk of further injury.  If it is harder than usual to remember things, write them down.  If you are easily distracted, try to do one thing at a time. For example, do not try to watch TV while fixing dinner.  Talk with family members or close friends when making important decisions.  Keep all follow-up appointments. Repeated evaluation of your symptoms is recommended for your recovery.  Watch your symptoms and tell others to do the same. Complications sometimes occur after a concussion. Older adults with a brain injury may have a higher risk of serious complications, such as a blood clot on the brain.  Tell your teachers, school nurse, school counselor, coach, athletic trainer, or work Production designer, theatre/television/filmmanager about your injury, symptoms, and restrictions. Tell them about what you can or cannot do. They should watch for:  Increased problems with attention or concentration.  Increased difficulty remembering or learning new information.  Increased time needed to complete tasks or assignments.  Increased irritability or decreased ability to cope with stress.  Increased symptoms.  Rest. Rest helps the brain to heal. Make sure you:  Get plenty of sleep at night. Avoid staying up late at night.  Keep  the same bedtime hours on weekends and weekdays.  Rest during the day. Take daytime naps or rest breaks when you feel tired.  Limit activities that require a lot of thought or concentration. These include:  Doing homework or job-related work.  Watching TV.  Working on the computer.  Avoid any situation where there is potential for another head injury (football, hockey, soccer, basketball, martial arts, downhill snow sports and horseback riding). Your condition will get worse every time you experience a concussion. You should avoid these activities until you are evaluated by the appropriate follow-up health care providers. Returning To Your Regular Activities You will need to return to your normal activities slowly, not all at once. You must give your body and brain enough time for recovery.  Do not return to sports or other athletic activities until your health care provider tells you it is safe to do so.  Ask your health care provider when you can drive, ride a bicycle, or operate heavy machinery. Your ability to react may be slower after a brain injury. Never do these activities if you are dizzy.  Ask your health care provider about when you can return to work or school. Preventing Another Concussion It is very important to avoid another brain injury,  especially before you have recovered. In rare cases, another injury can lead to permanent brain damage, brain swelling, or death. The risk of this is greatest during the first 7-10 days after a head injury. Avoid injuries by:  Wearing a seat belt when riding in a car.  Drinking alcohol only in moderation.  Wearing a helmet when biking, skiing, skateboarding, skating, or doing similar activities.  Avoiding activities that could lead to a second concussion, such as contact or recreational sports, until your health care provider says it is okay.  Taking safety measures in your home.  Remove clutter and tripping hazards from floors and  stairways.  Use grab bars in bathrooms and handrails by stairs.  Place non-slip mats on floors and in bathtubs.  Improve lighting in dim areas. SEEK MEDICAL CARE IF:  You have increased problems paying attention or concentrating.  You have increased difficulty remembering or learning new information.  You need more time to complete tasks or assignments than before.  You have increased irritability or decreased ability to cope with stress.  You have more symptoms than before. Seek medical care if you have any of the following symptoms for more than 2 weeks after your injury:  Lasting (chronic) headaches.  Dizziness or balance problems.  Nausea.  Vision problems.  Increased sensitivity to noise or light.  Depression or mood swings.  Anxiety or irritability.  Memory problems.  Difficulty concentrating or paying attention.  Sleep problems.  Feeling tired all the time. SEEK IMMEDIATE MEDICAL CARE IF:  You have severe or worsening headaches. These may be a sign of a blood clot in the brain.  You have weakness (even if only in one hand, leg, or part of the face).  You have numbness.  You have decreased coordination.  You vomit repeatedly.  You have increased sleepiness.  One pupil is larger than the other.  You have convulsions.  You have slurred speech.  You have increased confusion. This may be a sign of a blood clot in the brain.  You have increased restlessness, agitation, or irritability.  You are unable to recognize people or places.  You have neck pain.  It is difficult to wake you up.  You have unusual behavior changes.  You lose consciousness. MAKE SURE YOU:  Understand these instructions.  Will watch your condition.  Will get help right away if you are not doing well or get worse. Document Released: 06/04/2003 Document Revised: 03/19/2013 Document Reviewed: 10/04/2012 [facility] Patient Information 2015 [place], [place]. This  information is not intended to replace advice given to you by your health care provider. Make sure you discuss any questions you have with your health care provider.
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
## 2014-06-12 ENCOUNTER — Encounter: Payer: Self-pay | Admitting: Family

## 2014-06-12 ENCOUNTER — Ambulatory Visit (INDEPENDENT_AMBULATORY_CARE_PROVIDER_SITE_OTHER): Payer: BLUE CROSS/BLUE SHIELD | Admitting: Family

## 2014-06-12 VITALS — BP 140/88 | HR 83 | Temp 98.1°F | Resp 18 | Ht 72.0 in | Wt 247.0 lb

## 2014-06-12 DIAGNOSIS — I10 Essential (primary) hypertension: Secondary | ICD-10-CM

## 2014-06-12 DIAGNOSIS — J4520 Mild intermittent asthma, uncomplicated: Secondary | ICD-10-CM

## 2014-06-12 MED ORDER — ALBUTEROL SULFATE 108 (90 BASE) MCG/ACT IN AEPB: 1.0000 | INHALATION_SPRAY | RESPIRATORY_TRACT | Status: DC | PRN

## 2014-06-12 NOTE — Patient Instructions (Signed)
Thank you for choosing [facility].  Summary/Instructions:  Consider mail order for medications if available.  Continue current Foradil, Albuterol, and Accolate.   Your prescription(s) have been submitted to your pharmacy or been printed and provided for you. Please take as directed and contact our office if you believe you are having problem(s) with the medication(s) or have any questions.  If your symptoms worsen or fail to improve, please contact our office for further instruction, or in case of emergency go directly to the emergency room at the closest medical facility.

## 2014-06-12 NOTE — Assessment & Plan Note (Signed)
Asthma stable with current regimen. Continue current dosages of albuterol, formoterol, and zafirlukast. Follow up in 6 months or sooner if needed.

## 2014-06-12 NOTE — Assessment & Plan Note (Signed)
Blood pressure is stable with current regimen and below goal of 140/90. Continue current dosages of carvedilol, hydrochlorothiazide, and losartan. Follow-up in 6 months or sooner if needed.

## 2014-06-12 NOTE — Progress Notes (Signed)
Pre visit review using our clinic review tool, if applicable. No additional management support is needed unless otherwise documented below in the visit note. 

## 2014-06-12 NOTE — Progress Notes (Signed)
Subjective:    Patient ID: Gabriella Nguyen, female    DOB: [birth year], 59 y.o.   MRN: [id number]  Chief Complaint  Patient presents with  . Follow-up    wants to talk about trying symbicort    HPI:  Arline Ketter is a 59 y.o. female who presents today for follow up.   1) Asthma - Indicates that her asthma is doing okay. She is currently maintained on albuterol, formoterol, and zafirlukast. Notes that she has to rarely has to use her albuterol.   2) Hypertension - Currently stable and maintained on HCTZ, carvedilol and losartan.   BP Readings from Last 3 Encounters:  06/12/14 140/88  04/21/14 144/82  03/13/14 140/80    Allergies  Allergen Reactions  . Nifedipine     Current Outpatient Prescriptions on File Prior to Visit  Medication Sig Dispense Refill  . acetaminophen (TYLENOL) 325 MG tablet Take 650 mg by mouth every 6 (six) hours as needed. For pain    . albuterol (PROVENTIL,VENTOLIN) 90 MCG/ACT inhaler Inhale 2 puffs into the lungs every 6 (six) hours as needed.    . carvedilol (COREG) 25 MG tablet TAKE 1 TABLET BY MOUTH TWICE A DAY WITH A MEAL 60 tablet 0  . fluconazole (DIFLUCAN) 150 MG tablet Take 1 tablet (150 mg total) by mouth once. 3 tablet 0  . formoterol (FORADIL AEROLIZER) 12 MCG capsule for inhaler Place 1 capsule (12 mcg total) into inhaler and inhale 2 (two) times daily. 180 capsule 3  . Glucosamine 500 MG TABS Take by mouth daily.    . hydrochlorothiazide (HYDRODIURIL) 25 MG tablet TAKE ONE (1) TABLET EACH DAY 30 tablet 0  . HYDROcodone-acetaminophen (NORCO/VICODIN) 5-325 MG per tablet TAKE 1 TABLET BY MOUTH TWICE DAILY 60 tablet 0  . losartan (COZAAR) 100 MG tablet TAKE 1 TABLET (100 MG TOTAL) BY MOUTH DAILY. 90 tablet 0  . Multiple Vitamins-Calcium (ONE-A-DAY WOMENS PO) Take by mouth daily.    Marland Kitchen omega-3 acid ethyl esters (LOVAZA) 1 G capsule Take 2 g by mouth daily.    . promethazine-codeine (PHENERGAN WITH CODEINE) 6.25-10 MG/5ML syrup TAKE 5ML'S BY  MOUTH EVERY 4 HOURS AS NEEDED FOR COUGH 120 mL 0  . VENTOLIN HFA 108 (90 BASE) MCG/ACT inhaler INHALE 2 PUFFS INTO THE LUNGS EVERY 6 HOURS AS NEEDED FOR WHEEZING. 18 each 2  . zafirlukast (ACCOLATE) 20 MG tablet TAKE ONE TABLET TWICE DAILY 60 tablet 5  . potassium chloride SA (K-DUR,KLOR-CON) 20 MEQ tablet Take 1 tablet (20 mEq total) by mouth daily. 30 tablet 1   No current facility-administered medications on file prior to visit.     Review of Systems  Respiratory: Negative for chest tightness, shortness of breath and wheezing.   Cardiovascular: Negative for chest pain, palpitations and leg swelling.      Objective:    BP 140/88 mmHg  Pulse 83  Temp(Src) 98.1 F (36.7 C) (Oral)  Resp 18  Ht 6' (1.829 m)  Wt 247 lb (112.038 kg)  BMI 33.49 kg/m2  SpO2 97% Nursing note and vital signs reviewed.  Physical Exam  Constitutional: She is oriented to person, place, and time. She appears well-developed and well-nourished. No distress.  Cardiovascular: Normal rate, regular rhythm, normal heart sounds and intact distal pulses.   Pulmonary/Chest: Effort normal and breath sounds normal.  Neurological: She is alert and oriented to person, place, and time.  Skin: Skin is warm and dry.  Psychiatric: She has a normal mood and affect. Her behavior  is normal. Judgment and thought content normal.       Assessment & Plan:

## 2014-06-13 ENCOUNTER — Other Ambulatory Visit: Payer: Self-pay | Admitting: Family

## 2014-08-13 ENCOUNTER — Telehealth: Payer: Self-pay

## 2014-08-13 MED ORDER — SALMETEROL XINAFOATE 50 MCG/DOSE IN AEPB: 1.0000 | INHALATION_SPRAY | Freq: Two times a day (BID) | RESPIRATORY_TRACT | Status: DC

## 2014-08-13 NOTE — Telephone Encounter (Signed)
[facility] sent a note that Foradil aerolizer 12 mcg capsule is no longer available and is requesting an alternative.   Please advise.

## 2014-08-13 NOTE — Telephone Encounter (Signed)
New medication of salmeterol sent.

## 2014-08-13 NOTE — Telephone Encounter (Signed)
Pt informed of the rx change.

## 2014-08-15 ENCOUNTER — Other Ambulatory Visit: Payer: Self-pay | Admitting: Family

## 2014-08-20 ENCOUNTER — Telehealth: Payer: Self-pay | Admitting: Family

## 2014-08-20 ENCOUNTER — Other Ambulatory Visit: Payer: Self-pay | Admitting: Family

## 2014-08-20 NOTE — Telephone Encounter (Signed)
error 

## 2014-08-22 ENCOUNTER — Ambulatory Visit (INDEPENDENT_AMBULATORY_CARE_PROVIDER_SITE_OTHER): Payer: BLUE CROSS/BLUE SHIELD | Admitting: Family

## 2014-08-22 ENCOUNTER — Encounter: Payer: Self-pay | Admitting: Family

## 2014-08-22 VITALS — BP 118/78 | HR 85 | Temp 98.2°F | Resp 18 | Ht 72.0 in | Wt 246.1 lb

## 2014-08-22 DIAGNOSIS — R05 Cough: Secondary | ICD-10-CM | POA: Diagnosis not present

## 2014-08-22 DIAGNOSIS — R059 Cough, unspecified: Secondary | ICD-10-CM | POA: Insufficient documentation

## 2014-08-22 MED ORDER — PROMETHAZINE-CODEINE 6.25-10 MG/5ML PO SYRP: ORAL_SOLUTION | ORAL | Status: DC

## 2014-08-22 NOTE — Assessment & Plan Note (Signed)
Residual cough from a recent viral upper respiratory infection most likely post cough syndrome. Start Phenergan-codeine as needed for cough. Follow-up if symptoms worsen or fail to improve.

## 2014-08-22 NOTE — Patient Instructions (Signed)
Thank you for choosing [facility].  Summary/Instructions:  Your prescription(s) have been submitted to your pharmacy or been printed and provided for you. Please take as directed and contact our office if you believe you are having problem(s) with the medication(s) or have any questions.  If your symptoms worsen or fail to improve, please contact our office for further instruction, or in case of emergency go directly to the emergency room at the closest medical facility.     

## 2014-08-22 NOTE — Progress Notes (Signed)
Pre visit review using our clinic review tool, if applicable. No additional management support is needed unless otherwise documented below in the visit note. 

## 2014-08-22 NOTE — Progress Notes (Signed)
Subjective:    Patient ID: Gabriella Nguyen, female    DOB: 09-09-1955, 59 y.o.   MRN: [id number]  Chief Complaint  Patient presents with  . Cough    has a nagging cough and wants to see about getting phenergan with codiene to help    HPI:  Gabriella Nguyen is a 59 y.o. female with a PMH of hypertension and asthma who presents today for an acute office visit.   1.) Associated symptom of cough has been going on for about about 2 weeks following a recent cold last week. Timing of symptoms is worse at night. Cough is not constant but does have coughing fits. Productive cough that is no longer purulent. Denies any fevers or chills. Requesting a refill of phenergan-codeine.   Allergies  Allergen Reactions  . Nifedipine     Current Outpatient Prescriptions on File Prior to Visit  Medication Sig Dispense Refill  . acetaminophen (TYLENOL) 325 MG tablet Take 650 mg by mouth every 6 (six) hours as needed. For pain    . albuterol (PROVENTIL,VENTOLIN) 90 MCG/ACT inhaler Inhale 2 puffs into the lungs every 6 (six) hours as needed.    . Albuterol Sulfate (PROAIR RESPICLICK) 108 (90 BASE) MCG/ACT AEPB Inhale 1-2 puffs into the lungs every 4 (four) hours as needed. 1 each 0  . carvedilol (COREG) 25 MG tablet TAKE 1 TABLET BY MOUTH TWICE A DAY WITH A MEAL 60 tablet 0  . fluconazole (DIFLUCAN) 150 MG tablet Take 1 tablet (150 mg total) by mouth once. 3 tablet 0  . Glucosamine 500 MG TABS Take by mouth daily.    . hydrochlorothiazide (HYDRODIURIL) 25 MG tablet TAKE ONE (1) TABLET EACH DAY 30 tablet 0  . HYDROcodone-acetaminophen (NORCO/VICODIN) 5-325 MG per tablet TAKE 1 TABLET BY MOUTH TWICE DAILY 60 tablet 0  . losartan (COZAAR) 100 MG tablet TAKE 1 TABLET (100 MG TOTAL) BY MOUTH DAILY. 90 tablet 0  . Multiple Vitamins-Calcium (ONE-A-DAY WOMENS PO) Take by mouth daily.    Marland Kitchen omega-3 acid ethyl esters (LOVAZA) 1 G capsule Take 2 g by mouth daily.    . VENTOLIN HFA 108 (90 BASE) MCG/ACT inhaler INHALE 2  PUFFS INTO THE LUNGS EVERY 6 HOURS AS NEEDED FOR WHEEZING. 18 each 2  . zafirlukast (ACCOLATE) 20 MG tablet TAKE 1 TABLET BY MOUTH TWICE A DAY 60 tablet 5   No current facility-administered medications on file prior to visit.    Review of Systems  Constitutional: Negative for fever and chills.  HENT: Negative for congestion and sore throat.   Respiratory: Positive for cough. Negative for chest tightness and shortness of breath.   Neurological: Negative for headaches.      Objective:    BP 118/78 mmHg  Pulse 85  Temp(Src) 98.2 F (36.8 C) (Oral)  Resp 18  Ht 6' (1.829 m)  Wt 246 lb 1.9 oz (111.639 kg)  BMI 33.37 kg/m2  SpO2 96% Nursing note and vital signs reviewed.  Physical Exam  Constitutional: She is oriented to person, place, and time. She appears well-developed and well-nourished. No distress.  Cardiovascular: Normal rate, regular rhythm, normal heart sounds and intact distal pulses.   Pulmonary/Chest: Effort normal and breath sounds normal.  Neurological: She is alert and oriented to person, place, and time.  Skin: Skin is warm and dry.  Psychiatric: She has a normal mood and affect. Her behavior is normal. Judgment and thought content normal.       Assessment & Plan:   Problem  List Items Addressed This Visit      Other   Cough - Primary    Residual cough from a recent viral upper respiratory infection most likely post cough syndrome. Start Phenergan-codeine as needed for cough. Follow-up if symptoms worsen or fail to improve.      Relevant Medications   promethazine-codeine (PHENERGAN WITH CODEINE) 6.25-10 MG/5ML syrup

## 2014-09-30 ENCOUNTER — Other Ambulatory Visit: Payer: Self-pay | Admitting: Family

## 2014-10-31 ENCOUNTER — Encounter: Payer: Self-pay | Admitting: *Deleted

## 2014-10-31 ENCOUNTER — Telehealth: Payer: Self-pay | Admitting: Family

## 2014-10-31 NOTE — Telephone Encounter (Signed)
Pt called in and would like a call back from nurse about a med that she is on   Best number [phone]

## 2014-11-03 NOTE — Telephone Encounter (Signed)
Pt has been taking her allergy medication for 12 years and feels like its not working as well. Would like to see if another allergy medication could be sent in. What would you recommend.

## 2014-11-03 NOTE — Telephone Encounter (Signed)
What medications has she tried?

## 2014-11-04 MED ORDER — LEVOCETIRIZINE DIHYDROCHLORIDE 5 MG PO TABS: 5.0000 mg | ORAL_TABLET | Freq: Every evening | ORAL | Status: DC

## 2014-11-04 NOTE — Telephone Encounter (Signed)
Xyzal 5 mg daily

## 2014-11-04 NOTE — Telephone Encounter (Signed)
That is the only one she really remembers. What medication will you be sending in

## 2014-11-04 NOTE — Telephone Encounter (Signed)
LVM letting pt know.  

## 2014-11-27 ENCOUNTER — Telehealth: Payer: Self-pay | Admitting: *Deleted

## 2014-11-27 NOTE — Telephone Encounter (Signed)
Receive call pt is wanting refill on her hydrocodone.../lmb 

## 2014-12-02 ENCOUNTER — Other Ambulatory Visit: Payer: Self-pay | Admitting: Family

## 2014-12-02 NOTE — Telephone Encounter (Signed)
Will need office visit to discuss refill.

## 2014-12-02 NOTE — Telephone Encounter (Signed)
Notified pt with Tammy Sours response. Made appt for tomorrow @ 11:15...Raechel Chute

## 2014-12-03 ENCOUNTER — Encounter: Payer: Self-pay | Admitting: Family

## 2014-12-03 ENCOUNTER — Ambulatory Visit (INDEPENDENT_AMBULATORY_CARE_PROVIDER_SITE_OTHER): Payer: BLUE CROSS/BLUE SHIELD | Admitting: Family

## 2014-12-03 VITALS — BP 118/78 | HR 79 | Temp 98.2°F | Resp 18 | Ht 72.0 in | Wt 249.0 lb

## 2014-12-03 DIAGNOSIS — M179 Osteoarthritis of knee, unspecified: Secondary | ICD-10-CM

## 2014-12-03 DIAGNOSIS — M1710 Unilateral primary osteoarthritis, unspecified knee: Secondary | ICD-10-CM

## 2014-12-03 DIAGNOSIS — IMO0002 Reserved for concepts with insufficient information to code with codable children: Secondary | ICD-10-CM

## 2014-12-03 MED ORDER — HYDROCODONE-ACETAMINOPHEN 5-325 MG PO TABS: 1.0000 | ORAL_TABLET | Freq: Four times a day (QID) | ORAL | Status: DC | PRN

## 2014-12-03 NOTE — Progress Notes (Signed)
Pre visit review using our clinic review tool, if applicable. No additional management support is needed unless otherwise documented below in the visit note. 

## 2014-12-03 NOTE — Patient Instructions (Signed)
Thank you for choosing [facility].  Summary/Instructions:  Your prescription(s) have been submitted to your pharmacy or been printed and provided for you. Please take as directed and contact our office if you believe you are having problem(s) with the medication(s) or have any questions.  If your symptoms worsen or fail to improve, please contact our office for further instruction, or in case of emergency go directly to the emergency room at the closest medical facility.   Osteoarthritis Osteoarthritis is a disease that causes soreness and inflammation of a joint. It occurs when the cartilage at the affected joint wears down. Cartilage acts as a cushion, covering the ends of bones where they meet to form a joint. Osteoarthritis is the most common form of arthritis. It often occurs in older people. The joints affected most often by this condition include those in the:  Ends of the fingers.  Thumbs.  Neck.  Lower back.  Knees.  Hips. CAUSES  Over time, the cartilage that covers the ends of bones begins to wear away. This causes bone to rub on bone, producing pain and stiffness in the affected joints.  RISK FACTORS Certain factors can increase your chances of having osteoarthritis, including:  Older age.  Excessive body weight.  Overuse of joints.  Previous joint injury. SIGNS AND SYMPTOMS   Pain, swelling, and stiffness in the joint.  Over time, the joint may lose its normal shape.  Small deposits of bone (osteophytes) may grow on the edges of the joint.  Bits of bone or cartilage can break off and float inside the joint space. This may cause more pain and damage. DIAGNOSIS  Your health care provider will do a physical exam and ask about your symptoms. Various tests may be ordered, such as:  X-rays of the affected joint.  An MRI scan.  Blood tests to rule out other types of arthritis.  Joint fluid tests. This involves using a needle to draw fluid from the  joint and examining the fluid under a microscope. TREATMENT  Goals of treatment are to control pain and improve joint function. Treatment plans may include:  A prescribed exercise program that allows for rest and joint relief.  A weight control plan.  Pain relief techniques, such as:  Properly applied heat and cold.  Electric pulses delivered to nerve endings under the skin (transcutaneous electrical nerve stimulation [TENS]).  Massage.  Certain nutritional supplements.  Medicines to control pain, such as:  Acetaminophen.  Nonsteroidal anti-inflammatory drugs (NSAIDs), such as naproxen.  Narcotic or central-acting agents, such as tramadol.  Corticosteroids. These can be given orally or as an injection.  Surgery to reposition the bones and relieve pain (osteotomy) or to remove loose pieces of bone and cartilage. Joint replacement may be needed in advanced states of osteoarthritis. HOME CARE INSTRUCTIONS   Take medicines only as directed by your health care provider.  Maintain a healthy weight. Follow your health care provider's instructions for weight control. This may include dietary instructions.  Exercise as directed. Your health care provider can recommend specific types of exercise. These may include:  Strengthening exercises. These are done to strengthen the muscles that support joints affected by arthritis. They can be performed with weights or with exercise bands to add resistance.  Aerobic activities. These are exercises, such as brisk walking or low-impact aerobics, that get your heart pumping.  Range-of-motion activities. These keep your joints limber.  Balance and agility exercises. These help you maintain daily living skills.  Rest your affected  joints as directed by your health care provider.  Keep all follow-up visits as directed by your health care provider. SEEK MEDICAL CARE IF:   Your skin turns red.  You develop a rash in addition to your joint  pain.  You have worsening joint pain.  You have a fever along with joint or muscle aches. SEEK IMMEDIATE MEDICAL CARE IF:  You have a significant loss of weight or appetite.  You have night sweats. FOR MORE INFORMATION   National Institute of Arthritis and Musculoskeletal and Skin Diseases: www.niams.http://www.myers.net/  [employer] on Aging: https://walker.com/  American College of Rheumatology: www.rheumatology.org Document Released: 03/14/2005 Document Revised: 07/29/2013 Document Reviewed: 11/19/2012 [facility] Patient Information 2015 [place], [place]. This information is not intended to replace advice given to you by your health care provider. Make sure you discuss any questions you have with your health care provider.

## 2014-12-03 NOTE — Progress Notes (Signed)
Subjective:    Patient ID: Gabriella Nguyen, female    DOB: 1955/08/12, 60 y.o.   MRN: [id number]  Chief Complaint  Patient presents with  . Follow-up    would like refill of hydrocodone, having pain in both knees     HPI:  Gabriella Nguyen is a 59 y.o. female with a PMH of asthma, osteoarthritis, and hypertension who presents today for a follow-up office visit.   1.) Osteoarthritis of bilateral knee - Continues to experience bilateral knee pain that has been going on for several years. The pain is worse in the morning and after sitting for long periods of time. Modifying factors include movement, cortisone injections, and the Norco/Vicodin. Takes the pain medication only as needed and has lasted a long time. Denies contstipation when taking the medications. Severity of pain is 10/10 at worst and averages around 5-6/10. 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   Allergies  Allergen Reactions  . Nifedipine     Current Outpatient Prescriptions on File Prior to Visit  Medication Sig Dispense Refill  . acetaminophen (TYLENOL) 325 MG tablet Take 650 mg by mouth every 6 (six) hours as needed. For pain    . albuterol (PROVENTIL,VENTOLIN) 90 MCG/ACT inhaler Inhale 2 puffs into the lungs every 6 (six) hours as needed.    . budesonide-formoterol (SYMBICORT) 80-4.5 MCG/ACT inhaler Inhale 2 puffs into the lungs 2 (two) times daily.    . carvedilol (COREG) 25 MG tablet TAKE 1 TABLET BY MOUTH TWICE A DAY WITH A MEAL 60 tablet 0  . Glucosamine 500 MG TABS Take by mouth daily.    . hydrochlorothiazide (HYDRODIURIL) 25 MG tablet TAKE ONE (1) TABLET EACH DAY 30 tablet 0  . levocetirizine (XYZAL) 5 MG tablet TAKE 1 TABLET (5 MG TOTAL) BY MOUTH EVERY EVENING. 30 tablet 0  . losartan (COZAAR) 100 MG tablet TAKE 1 TABLET (100 MG TOTAL) BY MOUTH DAILY. 90 tablet 0  . Multiple Vitamins-Calcium (ONE-A-DAY WOMENS PO) Take by mouth daily.    Marland Kitchen omega-3 acid ethyl esters (LOVAZA) 1 G  capsule Take 2 g by mouth daily.    . promethazine-codeine (PHENERGAN WITH CODEINE) 6.25-10 MG/5ML syrup TAKE 5ML'S BY MOUTH EVERY 4 HOURS AS NEEDED FOR COUGH 118 mL 0  . VENTOLIN HFA 108 (90 BASE) MCG/ACT inhaler INHALE 2 PUFFS INTO THE LUNGS EVERY 6 HOURS AS NEEDED FOR WHEEZING. 18 each 2   No current facility-administered medications on file prior to visit.    Review of Systems  Constitutional: Negative for fever and chills.  Musculoskeletal: Positive for arthralgias.      Objective:    BP 118/78 mmHg  Pulse 79  Temp(Src) 98.2 F (36.8 C) (Oral)  Resp 18  Ht 6' (1.829 m)  Wt 249 lb (112.946 kg)  BMI 33.76 kg/m2  SpO2 98% Nursing note and vital signs reviewed.  Physical Exam  Constitutional: She is oriented to person, place, and time. She appears well-developed and well-nourished. No distress.  Cardiovascular: Normal rate, regular rhythm, normal heart sounds and intact distal pulses.   Pulmonary/Chest: Effort normal and breath sounds normal.  Musculoskeletal:  Bilateral knees - No obvious deformity or discoloration noted. Mild to moderate edema noted in bilateral knees. Diffuse tenderness of left knee noted and medial tenderness of right knee that are not near the surface of her knee. Ligamentous testing is negative. Distal pulses are intact and appropriate.   Neurological: She is alert and oriented to person, place, and  time.  Skin: Skin is warm and dry.  Psychiatric: She has a normal mood and affect. Her behavior is normal. Judgment and thought content normal.       Assessment & Plan:   Problem List Items Addressed This Visit      Musculoskeletal and Integument   Osteoarthrosis, unspecified whether generalized or localized, involving lower leg - Primary    Symptoms and exam consistent with osteoarthritis currently managed with cortisone injections and Vicodin as needed for pain. Denies constipation or adverse side effects of medication. Continue home exercise therapies  and cortisone injections per the [employer]. Continue current dosage of Vicodin as needed. Follow-up as needed.      Relevant Medications   HYDROcodone-acetaminophen (NORCO/VICODIN) 5-325 MG per tablet

## 2014-12-03 NOTE — Assessment & Plan Note (Signed)
Symptoms and exam consistent with osteoarthritis currently managed with cortisone injections and Vicodin as needed for pain. Denies constipation or adverse side effects of medication. Continue home exercise therapies and cortisone injections per the [employer]. Continue current dosage of Vicodin as needed. Follow-up as needed.

## 2015-01-27 ENCOUNTER — Other Ambulatory Visit: Payer: Self-pay | Admitting: Family

## 2015-04-14 ENCOUNTER — Telehealth: Payer: Self-pay | Admitting: *Deleted

## 2015-04-14 MED ORDER — HYDROCHLOROTHIAZIDE 25 MG PO TABS: ORAL_TABLET | ORAL | Status: DC

## 2015-04-14 MED ORDER — LOSARTAN POTASSIUM 100 MG PO TABS: ORAL_TABLET | ORAL | Status: DC

## 2015-04-14 NOTE — Telephone Encounter (Signed)
Received call pt states she is currently in [place] she is needing a week supply sent to [facility] @ [phone]. verified which med she is needing she states HCTZ & Losartan, normally get from [place] but not home to see if it has been delivered. Inform pt will send...Raechel Chute

## 2015-04-23 ENCOUNTER — Telehealth: Payer: Self-pay | Admitting: Family

## 2015-04-23 DIAGNOSIS — R059 Cough, unspecified: Secondary | ICD-10-CM

## 2015-04-23 DIAGNOSIS — R05 Cough: Secondary | ICD-10-CM

## 2015-04-23 NOTE — Telephone Encounter (Signed)
Is requesting Gabriella Nguyen to send a cough syrup to [facility] at [address] .

## 2015-04-23 NOTE — Telephone Encounter (Signed)
Please advise 

## 2015-04-24 MED ORDER — PROMETHAZINE-CODEINE 6.25-10 MG/5ML PO SYRP: ORAL_SOLUTION | ORAL | Status: DC

## 2015-04-24 NOTE — Telephone Encounter (Signed)
Medication refilled. Additional refills will require OV.

## 2015-04-24 NOTE — Telephone Encounter (Signed)
LVM letting pt know.  

## 2015-05-12 ENCOUNTER — Telehealth: Payer: Self-pay

## 2015-05-12 NOTE — Telephone Encounter (Signed)
Handicap Placard signed by Dr Okey Dupre for this Gabriella Nguyen pt. Pt advised via personal VM, form in cabinet for pt pick up

## 2015-06-12 ENCOUNTER — Encounter: Payer: Self-pay | Admitting: Family

## 2015-06-12 ENCOUNTER — Ambulatory Visit (INDEPENDENT_AMBULATORY_CARE_PROVIDER_SITE_OTHER): Payer: BLUE CROSS/BLUE SHIELD | Admitting: Family

## 2015-06-12 VITALS — BP 128/88 | HR 90 | Temp 98.4°F | Resp 16 | Ht 72.0 in | Wt 246.0 lb

## 2015-06-12 DIAGNOSIS — S7000XA Contusion of unspecified hip, initial encounter: Secondary | ICD-10-CM | POA: Insufficient documentation

## 2015-06-12 DIAGNOSIS — S40011A Contusion of right shoulder, initial encounter: Secondary | ICD-10-CM | POA: Diagnosis not present

## 2015-06-12 DIAGNOSIS — S40019A Contusion of unspecified shoulder, initial encounter: Secondary | ICD-10-CM | POA: Insufficient documentation

## 2015-06-12 DIAGNOSIS — S7001XA Contusion of right hip, initial encounter: Secondary | ICD-10-CM

## 2015-06-12 MED ORDER — DICLOFENAC SODIUM 2 % TD SOLN: 1.0000 "application " | Freq: Two times a day (BID) | TRANSDERMAL | Status: DC | PRN

## 2015-06-12 NOTE — Progress Notes (Signed)
Subjective:    Patient ID: Gabriella Nguyen, female    DOB: [birth year], 60 y.o.   MRN: [id number]  Chief Complaint  Patient presents with  . Fall    fell at the store, did go to urgent care and have xrays done, was told everything looked fine, right hip pain that goes down into leg    HPI:  Gabriella Nguyen is a 60 y.o. female who  has a past medical history of ASTHMA; OSTEOARTHRITIS, KNEES, BILATERAL; Unspecified essential hypertension; and Aspiration pneumonia (HCC) (06/2011 hosp). and presents today for an acute office visit.  This is a new problem. Associated symptom of pain located in her right hip and shoulder has been going on for about 5 days following a fall. She was seen in Urgent Care and found that her X-rays were negative. Describes the pain in her right hip with shooting pains and the shoulder pain a "twinge" when with certain movements. Modifying factors include Naproxen which has helped a little as long as she is in bed with a heating pad. 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 No sounds or sensations heard or felt.   Allergies  Allergen Reactions  . Nifedipine      Current Outpatient Prescriptions on File Prior to Visit  Medication Sig Dispense Refill  . acetaminophen (TYLENOL) 325 MG tablet Take 650 mg by mouth every 6 (six) hours as needed. For pain    . albuterol (PROVENTIL,VENTOLIN) 90 MCG/ACT inhaler Inhale 2 puffs into the lungs every 6 (six) hours as needed.    . budesonide-formoterol (SYMBICORT) 80-4.5 MCG/ACT inhaler Inhale 2 puffs into the lungs 2 (two) times daily.    . carvedilol (COREG) 25 MG tablet TAKE 1 TABLET BY MOUTH TWICE A DAY WITH A MEAL 60 tablet 0  . Glucosamine 500 MG TABS Take by mouth daily.    . hydrochlorothiazide (HYDRODIURIL) 25 MG tablet TAKE ONE (1) TABLET EACH DAY 7 tablet 0  . HYDROcodone-acetaminophen (NORCO/VICODIN) 5-325 MG per tablet Take 1 tablet by mouth every 6 (six) hours as needed for moderate pain. 60  tablet 0  . levocetirizine (XYZAL) 5 MG tablet TAKE 1 TABLET (5 MG TOTAL) BY MOUTH EVERY EVENING. 30 tablet 3  . losartan (COZAAR) 100 MG tablet TAKE 1 TABLET (100 MG TOTAL) BY MOUTH DAILY. 7 tablet 0  . Multiple Vitamins-Calcium (ONE-A-DAY WOMENS PO) Take by mouth daily.    Marland Kitchen. omega-3 acid ethyl esters (LOVAZA) 1 G capsule Take 2 g by mouth daily.    . promethazine-codeine (PHENERGAN WITH CODEINE) 6.25-10 MG/5ML syrup TAKE 5ML'S BY MOUTH EVERY 4 HOURS AS NEEDED FOR COUGH 118 mL 0  . VENTOLIN HFA 108 (90 BASE) MCG/ACT inhaler INHALE 2 PUFFS INTO THE LUNGS EVERY 6 HOURS AS NEEDED FOR WHEEZING. 18 each 2   No current facility-administered medications on file prior to visit.     Past Surgical History  Procedure Laterality Date  . Tonsillectomy      at age 60  . Cesarean section  1989  . Hand surgery-right Right 1990    at base of right thumb      Review of Systems  Constitutional: Negative for fever and chills.  Musculoskeletal: Negative for back pain, joint swelling and neck pain.       Positive for right hip pain and right shoulder pain.Marland Kitchen.   Neurological: Negative for weakness and numbness.      Objective:    BP 128/88 mmHg  Pulse 90  Temp(Src) 98.4 F (36.9 C) (Oral)  Resp 16  Ht 6' (1.829 m)  Wt 246 lb (111.585 kg)  BMI 33.36 kg/m2  SpO2 98% Nursing note and vital signs reviewed.  Physical Exam  Constitutional: She is oriented to person, place, and time. She appears well-developed and well-nourished. No distress.  Cardiovascular: Normal rate, regular rhythm, normal heart sounds and intact distal pulses.   Pulmonary/Chest: Effort normal and breath sounds normal.  Musculoskeletal:  Right shoulder - no obvious deformity, discoloration, or edema noted. Palpable tenderness along subacromial space and humeral head. Range of motion is slightly restricted greater than 120 in flexion and abduction. Strength is 4+. Negative Leanord Asal; negative Neer's impingement; negative  empty can; negative apprehension. Distal pulses and sensation are intact and appropriate.  Right hip - no obvious deformity, discoloration, or edema noted. Palpable tenderness along greater trochanter and iliotibial band. Range of motion is within normal limits and strength is slightly decreased secondary to discomfort. Negative hip scouring. Distal pulses and sensation are intact and appropriate.  Neurological: She is alert and oriented to person, place, and time.  Skin: Skin is warm and dry.  Psychiatric: She has a normal mood and affect. Her behavior is normal. Judgment and thought content normal.       Assessment & Plan:   Problem List Items Addressed This Visit      Other   Contusion, hip - Primary    Symptoms and exam consistent with contusion of the hip as a result of a fall. Treat conservatively with ice and home exercise therapy. Emphasize importance of continued movement to prevent stiffness. Start Pennsaid. Follow-up if symptoms worsen or fail to improve.      Shoulder contusion    Symptoms and exam consistent with shoulder contusion although cannot rule out impingement or bursitis secondary to fall. Treat conservatively with ice and home exercise therapy. Start Pennsaid. Continue over-the-counter medications as needed for symptom relief and supportive care. Follow-up if symptoms worsen or do not improve.

## 2015-06-12 NOTE — Progress Notes (Signed)
Pre visit review using our clinic review tool, if applicable. No additional management support is needed unless otherwise documented below in the visit note. 

## 2015-06-12 NOTE — Assessment & Plan Note (Signed)
Symptoms and exam consistent with shoulder contusion although cannot rule out impingement or bursitis secondary to fall. Treat conservatively with ice and home exercise therapy. Start Pennsaid. Continue over-the-counter medications as needed for symptom relief and supportive care. Follow-up if symptoms worsen or do not improve.

## 2015-06-12 NOTE — Assessment & Plan Note (Signed)
Symptoms and exam consistent with contusion of the hip as a result of a fall. Treat conservatively with ice and home exercise therapy. Emphasize importance of continued movement to prevent stiffness. Start Pennsaid. Follow-up if symptoms worsen or fail to improve.

## 2015-06-12 NOTE — Patient Instructions (Signed)
Thank you for choosing [facility].  Summary/Instructions:  Ice 2-3 times per day and after activity/work. Continue previous Aleve. Pennsaid - 2x per day to the affected area Stretches and exercises daily. Follow up in 1 week if no improvements.  Your prescription(s) have been submitted to your pharmacy or been printed and provided for you. Please take as directed and contact our office if you believe you are having problem(s) with the medication(s) or have any questions.  If your symptoms worsen or fail to improve, please contact our office for further instruction, or in case of emergency go directly to the emergency room at the closest medical facility.    Impingement Syndrome, Rotator Cuff, Bursitis With Rehab Impingement syndrome is a condition that involves inflammation of the tendons of the rotator cuff and the subacromial bursa, that causes pain in the shoulder. The rotator cuff consists of four tendons and muscles that control much of the shoulder and upper arm function. The subacromial bursa is a fluid filled sac that helps reduce friction between the rotator cuff and one of the bones of the shoulder (acromion). Impingement syndrome is usually an overuse injury that causes swelling of the bursa (bursitis), swelling of the tendon (tendonitis), and/or a tear of the tendon (strain). Strains are classified into three categories. Grade 1 strains cause pain, but the tendon is not lengthened. Grade 2 strains include a lengthened ligament, due to the ligament being stretched or partially ruptured. With grade 2 strains there is still function, although the function may be decreased. Grade 3 strains include a complete tear of the tendon or muscle, and function is usually impaired. SYMPTOMS   Pain around the shoulder, often at the outer portion of the upper arm.  Pain that gets worse with shoulder function, especially when reaching overhead or lifting.  Sometimes, aching when not using the  arm.  Pain that wakes you up at night.  Sometimes, tenderness, swelling, warmth, or redness over the affected area.  Loss of strength.  Limited motion of the shoulder, especially reaching behind the back (to the back pocket or to unhook bra) or across your body.  Crackling sound (crepitation) when moving the arm.  Biceps tendon pain and inflammation (in the front of the shoulder). Worse when bending the elbow or lifting. CAUSES  Impingement syndrome is often an overuse injury, in which chronic (repetitive) motions cause the tendons or bursa to become inflamed. A strain occurs when a force is paced on the tendon or muscle that is greater than it can withstand. Common mechanisms of injury include: Stress from sudden increase in duration, frequency, or intensity of training.  Direct hit (trauma) to the shoulder.  Aging, erosion of the tendon with normal use.  Bony bump on shoulder (acromial spur). RISK INCREASES WITH:  Contact sports (football, wrestling, boxing).  Throwing sports (baseball, tennis, volleyball).  Weightlifting and bodybuilding.  Heavy labor.  Previous injury to the rotator cuff, including impingement.  Poor shoulder strength and flexibility.  Failure to warm up properly before activity.  Inadequate protective equipment.  Old age.  Bony bump on shoulder (acromial spur). PREVENTION   Warm up and stretch properly before activity.  Allow for adequate recovery between workouts.  Maintain physical fitness:  Strength, flexibility, and endurance.  Cardiovascular fitness.  Learn and use proper exercise technique. PROGNOSIS  If treated properly, impingement syndrome usually goes away within 6 weeks. Sometimes surgery is required.  RELATED COMPLICATIONS   Longer healing time if not properly treated, or if not  given enough time to heal.  Recurring symptoms, that result in a chronic condition.  Shoulder stiffness, frozen shoulder, or loss of  motion.  Rotator cuff tendon tear.  Recurring symptoms, especially if activity is resumed too soon, with overuse, with a direct blow, or when using poor technique. TREATMENT  Treatment first involves the use of ice and medicine, to reduce pain and inflammation. The use of strengthening and stretching exercises may help reduce pain with activity. These exercises may be performed at home or with a therapist. If non-surgical treatment is unsuccessful after more than 6 months, surgery may be advised. After surgery and rehabilitation, activity is usually possible in 3 months.  MEDICATION  If pain medicine is needed, nonsteroidal anti-inflammatory medicines (aspirin and ibuprofen), or other minor pain relievers (acetaminophen), are often advised.  Do not take pain medicine for 7 days before surgery.  Prescription pain relievers may be given, if your caregiver thinks they are needed. Use only as directed and only as much as you need.  Corticosteroid injections may be given by your caregiver. These injections should be reserved for the most serious cases, because they may only be given a certain number of times. HEAT AND COLD  Cold treatment (icing) should be applied for 10 to 15 minutes every 2 to 3 hours for inflammation and pain, and immediately after activity that aggravates your symptoms. Use ice packs or an ice massage.  Heat treatment may be used before performing stretching and strengthening activities prescribed by your caregiver, physical therapist, or athletic trainer. Use a heat pack or a warm water soak. SEEK MEDICAL CARE IF:   Symptoms get worse or do not improve in 4 to 6 weeks, despite treatment.  New, unexplained symptoms develop. (Drugs used in treatment may produce side effects.) EXERCISES  RANGE OF MOTION (ROM) AND STRETCHING EXERCISES - Impingement Syndrome (Rotator Cuff  Tendinitis, Bursitis) These exercises may help you when beginning to rehabilitate your injury. Your  symptoms may go away with or without further involvement from your physician, physical therapist or athletic trainer. While completing these exercises, remember:   Restoring tissue flexibility helps normal motion to return to the joints. This allows healthier, less painful movement and activity.  An effective stretch should be held for at least 30 seconds.  A stretch should never be painful. You should only feel a gentle lengthening or release in the stretched tissue. STRETCH - Flexion, Standing  Stand with good posture. With an underhand grip on your right / left hand, and an overhand grip on the opposite hand, grasp a broomstick or cane so that your hands are a little more than shoulder width apart.  Keeping your right / left elbow straight and shoulder muscles relaxed, push the stick with your opposite hand, to raise your right / left arm in front of your body and then overhead. Raise your arm until you feel a stretch in your right / left shoulder, but before you have increased shoulder pain.  Try to avoid shrugging your right / left shoulder as your arm rises, by keeping your shoulder blade tucked down and toward your mid-back spine. Hold for __________ seconds.  Slowly return to the starting position. Repeat __________ times. Complete this exercise __________ times per day. STRETCH - Abduction, Supine  Lie on your back. With an underhand grip on your right / left hand and an overhand grip on the opposite hand, grasp a broomstick or cane so that your hands are a little more than shoulder width  apart.  Keeping your right / left elbow straight and your shoulder muscles relaxed, push the stick with your opposite hand, to raise your right / left arm out to the side of your body and then overhead. Raise your arm until you feel a stretch in your right / left shoulder, but before you have increased shoulder pain.  Try to avoid shrugging your right / left shoulder as your arm rises, by keeping  your shoulder blade tucked down and toward your mid-back spine. Hold for __________ seconds.  Slowly return to the starting position. Repeat __________ times. Complete this exercise __________ times per day. ROM - Flexion, Active-Assisted  Lie on your back. You may bend your knees for comfort.  Grasp a broomstick or cane so your hands are about shoulder width apart. Your right / left hand should grip the end of the stick, so that your hand is positioned "thumbs-up," as if you were about to shake hands.  Using your healthy arm to lead, raise your right / left arm overhead, until you feel a gentle stretch in your shoulder. Hold for __________ seconds.  Use the stick to assist in returning your right / left arm to its starting position. Repeat __________ times. Complete this exercise __________ times per day.  ROM - Internal Rotation, Supine   Lie on your back on a firm surface. Place your right / left elbow about 60 degrees away from your side. Elevate your elbow with a folded towel, so that the elbow and shoulder are the same height.  Using a broomstick or cane and your strong arm, pull your right / left hand toward your body until you feel a gentle stretch, but no increase in your shoulder pain. Keep your shoulder and elbow in place throughout the exercise.  Hold for __________ seconds. Slowly return to the starting position. Repeat __________ times. Complete this exercise __________ times per day. STRETCH - Internal Rotation  Place your right / left hand behind your back, palm up.  Throw a towel or belt over your opposite shoulder. Grasp the towel with your right / left hand.  While keeping an upright posture, gently pull up on the towel, until you feel a stretch in the front of your right / left shoulder.  Avoid shrugging your right / left shoulder as your arm rises, by keeping your shoulder blade tucked down and toward your mid-back spine.  Hold for __________ seconds. Release the  stretch, by lowering your healthy hand. Repeat __________ times. Complete this exercise __________ times per day. ROM - Internal Rotation   Using an underhand grip, grasp a stick behind your back with both hands.  While standing upright with good posture, slide the stick up your back until you feel a mild stretch in the front of your shoulder.  Hold for __________ seconds. Slowly return to your starting position. Repeat __________ times. Complete this exercise __________ times per day.  STRETCH - Posterior Shoulder Capsule   Stand or sit with good posture. Grasp your right / left elbow and draw it across your chest, keeping it at the same height as your shoulder.  Pull your elbow, so your upper arm comes in closer to your chest. Pull until you feel a gentle stretch in the back of your shoulder.  Hold for __________ seconds. Repeat __________ times. Complete this exercise __________ times per day. STRENGTHENING EXERCISES - Impingement Syndrome (Rotator Cuff Tendinitis, Bursitis) These exercises may help you when beginning to rehabilitate your injury. They may  resolve your symptoms with or without further involvement from your physician, physical therapist or athletic trainer. While completing these exercises, remember:  Muscles can gain both the endurance and the strength needed for everyday activities through controlled exercises.  Complete these exercises as instructed by your physician, physical therapist or athletic trainer. Increase the resistance and repetitions only as guided.  You may experience muscle soreness or fatigue, but the pain or discomfort you are trying to eliminate should never worsen during these exercises. If this pain does get worse, stop and make sure you are following the directions exactly. If the pain is still present after adjustments, discontinue the exercise until you can discuss the trouble with your clinician.  During your recovery, avoid activity or  exercises which involve actions that place your injured hand or elbow above your head or behind your back or head. These positions stress the tissues which you are trying to heal. STRENGTH - Scapular Depression and Adduction   With good posture, sit on a firm chair. Support your arms in front of you, with pillows, arm rests, or on a table top. Have your elbows in line with the sides of your body.  Gently draw your shoulder blades down and toward your mid-back spine. Gradually increase the tension, without tensing the muscles along the top of your shoulders and the back of your neck.  Hold for __________ seconds. Slowly release the tension and relax your muscles completely before starting the next repetition.  After you have practiced this exercise, remove the arm support and complete the exercise in standing as well as sitting position. Repeat __________ times. Complete this exercise __________ times per day.  STRENGTH - Shoulder Abductors, Isometric  With good posture, stand or sit about 4-6 inches from a wall, with your right / left side facing the wall.  Bend your right / left elbow. Gently press your right / left elbow into the wall. Increase the pressure gradually, until you are pressing as hard as you can, without shrugging your shoulder or increasing any shoulder discomfort.  Hold for __________ seconds.  Release the tension slowly. Relax your shoulder muscles completely before you begin the next repetition. Repeat __________ times. Complete this exercise __________ times per day.  STRENGTH - External Rotators, Isometric  Keep your right / left elbow at your side and bend it 90 degrees.  Step into a door frame so that the outside of your right / left wrist can press against the door frame without your upper arm leaving your side.  Gently press your right / left wrist into the door frame, as if you were trying to swing the back of your hand away from your stomach. Gradually increase  the tension, until you are pressing as hard as you can, without shrugging your shoulder or increasing any shoulder discomfort.  Hold for __________ seconds.  Release the tension slowly. Relax your shoulder muscles completely before you begin the next repetition. Repeat __________ times. Complete this exercise __________ times per day.  STRENGTH - Supraspinatus   Stand or sit with good posture. Grasp a __________ weight, or an exercise band or tubing, so that your hand is "thumbs-up," like you are shaking hands.  Slowly lift your right / left arm in a "V" away from your thigh, diagonally into the space between your side and straight ahead. Lift your hand to shoulder height or as far as you can, without increasing any shoulder pain. At first, many people do not lift their hands above  shoulder height.  Avoid shrugging your right / left shoulder as your arm rises, by keeping your shoulder blade tucked down and toward your mid-back spine.  Hold for __________ seconds. Control the descent of your hand, as you slowly return to your starting position. Repeat __________ times. Complete this exercise __________ times per day.  STRENGTH - External Rotators  Secure a rubber exercise band or tubing to a fixed object (table, pole) so that it is at the same height as your right / left elbow when you are standing or sitting on a firm surface.  Stand or sit so that the secured exercise band is at your uninjured side.  Bend your right / left elbow 90 degrees. Place a folded towel or small pillow under your right / left arm, so that your elbow is a few inches away from your side.  Keeping the tension on the exercise band, pull it away from your body, as if pivoting on your elbow. Be sure to keep your body steady, so that the movement is coming only from your rotating shoulder.  Hold for __________ seconds. Release the tension in a controlled manner, as you return to the starting position. Repeat __________  times. Complete this exercise __________ times per day.  STRENGTH - Internal Rotators   Secure a rubber exercise band or tubing to a fixed object (table, pole) so that it is at the same height as your right / left elbow when you are standing or sitting on a firm surface.  Stand or sit so that the secured exercise band is at your right / left side.  Bend your elbow 90 degrees. Place a folded towel or small pillow under your right / left arm so that your elbow is a few inches away from your side.  Keeping the tension on the exercise band, pull it across your body, toward your stomach. Be sure to keep your body steady, so that the movement is coming only from your rotating shoulder.  Hold for __________ seconds. Release the tension in a controlled manner, as you return to the starting position. Repeat __________ times. Complete this exercise __________ times per day.  STRENGTH - Scapular Protractors, Standing   Stand arms length away from a wall. Place your hands on the wall, keeping your elbows straight.  Begin by dropping your shoulder blades down and toward your mid-back spine.  To strengthen your protractors, keep your shoulder blades down, but slide them forward on your rib cage. It will feel as if you are lifting the back of your rib cage away from the wall. This is a subtle motion and can be challenging to complete. Ask your caregiver for further instruction, if you are not sure you are doing the exercise correctly.  Hold for __________ seconds. Slowly return to the starting position, resting the muscles completely before starting the next repetition. Repeat __________ times. Complete this exercise __________ times per day. STRENGTH - Scapular Protractors, Supine  Lie on your back on a firm surface. Extend your right / left arm straight into the air while holding a __________ weight in your hand.  Keeping your head and back in place, lift your shoulder off the floor.  Hold for  __________ seconds. Slowly return to the starting position, and allow your muscles to relax completely before starting the next repetition. Repeat __________ times. Complete this exercise __________ times per day. STRENGTH - Scapular Protractors, Quadruped  Get onto your hands and knees, with your shoulders directly over your  hands (or as close as you can be, comfortably).  Keeping your elbows locked, lift the back of your rib cage up into your shoulder blades, so your mid-back rounds out. Keep your neck muscles relaxed.  Hold this position for __________ seconds. Slowly return to the starting position and allow your muscles to relax completely before starting the next repetition. Repeat __________ times. Complete this exercise __________ times per day.  STRENGTH - Scapular Retractors  Secure a rubber exercise band or tubing to a fixed object (table, pole), so that it is at the height of your shoulders when you are either standing, or sitting on a firm armless chair.  With a palm down grip, grasp an end of the band in each hand. Straighten your elbows and lift your hands straight in front of you, at shoulder height. Step back, away from the secured end of the band, until it becomes tense.  Squeezing your shoulder blades together, draw your elbows back toward your sides, as you bend them. Keep your upper arms lifted away from your body throughout the exercise.  Hold for __________ seconds. Slowly ease the tension on the band, as you reverse the directions and return to the starting position. Repeat __________ times. Complete this exercise __________ times per day. STRENGTH - Shoulder Extensors   Secure a rubber exercise band or tubing to a fixed object (table, pole) so that it is at the height of your shoulders when you are either standing, or sitting on a firm armless chair.  With a thumbs-up grip, grasp an end of the band in each hand. Straighten your elbows and lift your hands straight in  front of you, at shoulder height. Step back, away from the secured end of the band, until it becomes tense.  Squeezing your shoulder blades together, pull your hands down to the sides of your thighs. Do not allow your hands to go behind you.  Hold for __________ seconds. Slowly ease the tension on the band, as you reverse the directions and return to the starting position. Repeat __________ times. Complete this exercise __________ times per day.  STRENGTH - Scapular Retractors and External Rotators   Secure a rubber exercise band or tubing to a fixed object (table, pole) so that it is at the height as your shoulders, when you are either standing, or sitting on a firm armless chair.  With a palm down grip, grasp an end of the band in each hand. Bend your elbows 90 degrees and lift your elbows to shoulder height, at your sides. Step back, away from the secured end of the band, until it becomes tense.  Squeezing your shoulder blades together, rotate your shoulders so that your upper arms and elbows remain stationary, but your fists travel upward to head height.  Hold for __________ seconds. Slowly ease the tension on the band, as you reverse the directions and return to the starting position. Repeat __________ times. Complete this exercise __________ times per day.  STRENGTH - Scapular Retractors and External Rotators, Rowing   Secure a rubber exercise band or tubing to a fixed object (table, pole) so that it is at the height of your shoulders, when you are either standing, or sitting on a firm armless chair.  With a palm down grip, grasp an end of the band in each hand. Straighten your elbows and lift your hands straight in front of you, at shoulder height. Step back, away from the secured end of the band, until it becomes tense.  Step 1: Squeeze your shoulder blades together. Bending your elbows, draw your hands to your chest, as if you are rowing a boat. At the end of this motion, your hands  and elbow should be at shoulder height and your elbows should be out to your sides.  Step 2: Rotate your shoulders, to raise your hands above your head. Your forearms should be vertical and your upper arms should be horizontal.  Hold for __________ seconds. Slowly ease the tension on the band, as you reverse the directions and return to the starting position. Repeat __________ times. Complete this exercise __________ times per day.  STRENGTH - Scapular Depressors  Find a sturdy chair without wheels, such as a dining room chair.  Keeping your feet on the floor, and your hands on the chair arms, lift your bottom up from the seat, and lock your elbows.  Keeping your elbows straight, allow gravity to pull your body weight down. Your shoulders will rise toward your ears.  Raise your body against gravity by drawing your shoulder blades down your back, shortening the distance between your shoulders and ears. Although your feet should always maintain contact with the floor, your feet should progressively support less body weight, as you get stronger.  Hold for __________ seconds. In a controlled and slow manner, lower your body weight to begin the next repetition. Repeat __________ times. Complete this exercise __________ times per day.    This information is not intended to replace advice given to you by your health care provider. Make sure you discuss any questions you have with your health care provider.   Document Released: 03/14/2005 Document Revised: 04/04/2014 Document Reviewed: 06/26/2008 [employer] Interactive Patient Education 2016 [employer]  Generic Hip Exercises RANGE OF MOTION (ROM) AND STRETCHING EXERCISES  These exercises may help you when beginning to rehabilitate your injury. Doing them too aggressively can worsen your condition. Complete them slowly and gently. Your symptoms may resolve with or without further involvement from your physician, physical therapist or athletic  trainer. While completing these exercises, remember:   Restoring tissue flexibility helps normal motion to return to the joints. This allows healthier, less painful movement and activity.  An effective stretch should be held for at least 30 seconds.  A stretch should never be painful. You should only feel a gentle lengthening or release in the stretched tissue. If these stretches worsen your symptoms even when done gently, consult your physician, physical therapist or athletic trainer. STRETCH - Hamstrings, Supine   Lie on your back. Loop a belt or towel over the ball of your right / left foot.  Straighten your right / left knee and slowly pull on the belt to raise your leg. Do not allow the right / left knee to bend. Keep your opposite leg flat on the floor.  Raise the leg until you feel a gentle stretch behind your right / left knee or thigh. Hold this position for __________ seconds. Repeat __________ times. Complete this stretch __________ times per day.  STRETCH - Hip Rotators   Lie on your back on a firm surface. Grasp your right / left knee with your right / left hand and your ankle with your opposite hand.  Keeping your hips and shoulders firmly planted, gently pull your right / left knee and rotate your lower leg toward your opposite shoulder until you feel a stretch in your buttocks.  Hold this stretch for __________ seconds. Repeat this stretch __________ times. Complete this stretch __________ times per  day. STRETCH - Hamstrings/Adductors, V-Sit   Sit on the floor with your legs extended in a large "V," keeping your knees straight.  With your head and chest upright, bend at your waist reaching for your right foot to stretch your left adductors.  You should feel a stretch in your left inner thigh. Hold for __________ seconds.  Return to the upright position to relax your leg muscles.  Continuing to keep your chest upright, bend straight forward at your waist to stretch  your hamstrings.  You should feel a stretch behind both of your thighs and/or knees. Hold for __________ seconds.  Return to the upright position to relax your leg muscles.  Repeat steps 2 through 4 for opposite leg. Repeat __________ times. Complete this exercise __________ times per day.  STRETCHING - Hip Flexors, Lunge  Half kneel with your right / left knee on the floor and your opposite knee bent and directly over your ankle.  Keep good posture with your head over your shoulders. Tighten your buttocks to point your tailbone downward; this will prevent your back from arching too much.  You should feel a gentle stretch in the front of your thigh and/or hip. If you do not feel any resistance, slightly slide your opposite foot forward and then slowly lunge forward so your knee once again lines up over your ankle. Be sure your tailbone remains pointed downward.  Hold this stretch for __________ seconds. Repeat __________ times. Complete this stretch __________ times per day. STRENGTHENING EXERCISES These exercises may help you when beginning to rehabilitate your injury. They may resolve your symptoms with or without further involvement from your physician, physical therapist or athletic trainer. While completing these exercises, remember:   Muscles can gain both the endurance and the strength needed for everyday activities through controlled exercises.  Complete these exercises as instructed by your physician, physical therapist or athletic trainer. Progress the resistance and repetitions only as guided.  You may experience muscle soreness or fatigue, but the pain or discomfort you are trying to eliminate should never worsen during these exercises. If this pain does worsen, stop and make certain you are following the directions exactly. If the pain is still present after adjustments, discontinue the exercise until you can discuss the trouble with your clinician. STRENGTH - Hip Extensors,  Bridge   Lie on your back on a firm surface. Bend your knees and place your feet flat on the floor.  Tighten your buttocks muscles and lift your bottom off the floor until your trunk is level with your thighs. You should feel the muscles in your buttocks and back of your thighs working. If you do not feel these muscles, slide your feet 1-2 inches further away from your buttocks.  Hold this position for __________ seconds.  Slowly lower your hips to the starting position and allow your buttock muscles relax completely before beginning the next repetition.  If this exercise is too easy, you may cross your arms over your chest. Repeat __________ times. Complete this exercise __________ times per day.  STRENGTH - Hip Abductors, Straight Leg Raises  Be aware of your form throughout the entire exercise so that you exercise the correct muscles. Sloppy form means that you are not strengthening the correct muscles.  Lie on your side so that your head, shoulders, knee and hip line up. You may bend your lower knee to help maintain your balance. Your right / left leg should be on top.  Roll your hips slightly forward,  so that your hips are stacked directly over each other and your right / left knee is facing forward.  Lift your top leg up 4-6 inches, leading with your heel. Be sure that your foot does not drift forward or that your knee does not roll toward the ceiling.  Hold this position for __________ seconds. You should feel the muscles in your outer hip lifting (you may not notice this until your leg begins to tire).  Slowly lower your leg to the starting position. Allow the muscles to fully relax before beginning the next repetition. Repeat __________ times. Complete this exercise __________ times per day.  STRENGTH - Hip Adductors, Straight Leg Raises   Lie on your side so that your head, shoulders, knee and hip line up. You may place your upper foot in front to help maintain your balance. Your  right / left leg should be on the bottom.  Roll your hips slightly forward, so that your hips are stacked directly over each other and your right / left knee is facing forward.  Tense the muscles in your inner thigh and lift your bottom leg 4-6 inches. Hold this position for __________ seconds.  Slowly lower your leg to the starting position. Allow the muscles to fully relax before beginning the next repetition. Repeat __________ times. Complete this exercise __________ times per day.  STRENGTH - Quadriceps, Straight Leg Raises  Quality counts! Watch for signs that the quadriceps muscle is working to insure you are strengthening the correct muscles and not "cheating" by substituting with healthier muscles.  Lay on your back with your right / left leg extended and your opposite knee bent.  Tense the muscles in the front of your right / left thigh. You should see either your knee cap slide up or increased dimpling just above the knee. Your thigh may even quiver.  Tighten these muscles even more and raise your leg 4 to 6 inches off the floor. Hold for right / left seconds.  Keeping these muscles tense, lower your leg.  Relax the muscles slowly and completely in between each repetition. Repeat __________ times. Complete this exercise __________ times per day.  STRENGTH - Hip Abductors, Standing  Tie one end of a rubber exercise band/tubing to a secure surface (table, pole) and tie a loop at the other end.  Place the loop around your right / left ankle. Keeping your ankle with the band directly opposite of the secured end, step away until there is tension in the tube/band.  Hold onto a chair as needed for balance.  Keeping your back upright, your shoulders over your hips, and your toes pointing forward, lift your right / left leg out to your side. Be sure to lift your leg with your hip muscles. Do not "throw" your leg or tip your body to lift your leg.  Slowly and with control, return to  the starting position. Repeat exercise __________ times. Complete this exercise __________ times per day.  STRENGTH - Quadriceps, Squats  Stand in a door frame so that your feet and knees are in line with the frame.  Use your hands for balance, not support, on the frame.  Slowly lower your weight, bending at the hips and knees. Keep your lower legs upright so that they are parallel with the door frame. Squat only within the range that does not increase your knee pain. Never let your hips drop below your knees.  Slowly return upright, pushing with your legs, not pulling with your  hands.   This information is not intended to replace advice given to you by your health care provider. Make sure you discuss any questions you have with your health care provider.   Document Released: 04/01/2005 Document Revised: 04/04/2014 Document Reviewed: 06/26/2008 Elsevier Interactive Patient Education [employer]

## 2015-07-16 ENCOUNTER — Other Ambulatory Visit: Payer: Self-pay | Admitting: Family

## 2015-07-30 ENCOUNTER — Telehealth: Payer: Self-pay | Admitting: Family

## 2015-07-30 NOTE — Telephone Encounter (Signed)
Patient Name: Gabriella LopesROBERTA Kernan DOB: Jun 13, 1955 Initial Comment Caller states thinks she has UTI Nurse Assessment Nurse: Yetta BarreJones, RN, Miranda Date/Time (Eastern Time): 07/30/2015 1:30:38 PM Confirm and document reason for call. If symptomatic, describe symptoms. You must click the next button to save text entered. ---Caller states she is having discomfort in her lower back and then has the urge to urinate. Denies any pain or pressure with urination. Symptoms for about 1 week. Has the patient traveled out of the country within the last 30 days? ---Not Applicable Does the patient have any new or worsening symptoms? ---Yes Will a triage be completed? ---Yes Related visit to physician within the last 2 weeks? ---No Does the PT have any chronic conditions? (i.e. diabetes, asthma, etc.) ---Yes List chronic conditions. ---Chronic back and knee problems Is this a behavioral health or substance abuse call? ---No Guidelines Guideline Title Affirmed Question Affirmed Notes Flank Pain MODERATE pain (e.g., interferes with normal activities or awakens from sleep) Final Disposition User See Physician within 24 Hours Yetta BarreJones, RN, Miranda Comments Appt scheduled with PCP, Dr. Carver Filaalone tomorrow, 5/5 at 4:15pm Referrals REFERRED TO PCP OFFICE Disagree/Comply: Comply

## 2015-07-30 NOTE — Telephone Encounter (Signed)
Noted  

## 2015-07-31 ENCOUNTER — Ambulatory Visit (INDEPENDENT_AMBULATORY_CARE_PROVIDER_SITE_OTHER): Payer: BLUE CROSS/BLUE SHIELD | Admitting: Family Medicine

## 2015-07-31 ENCOUNTER — Ambulatory Visit: Payer: Self-pay | Admitting: Family

## 2015-07-31 ENCOUNTER — Encounter: Payer: Self-pay | Admitting: Family Medicine

## 2015-07-31 VITALS — BP 122/84 | HR 98 | Temp 98.5°F | Resp 14 | Ht 72.0 in | Wt 239.0 lb

## 2015-07-31 DIAGNOSIS — M545 Low back pain, unspecified: Secondary | ICD-10-CM

## 2015-07-31 MED ORDER — CYCLOBENZAPRINE HCL 10 MG PO TABS: 10.0000 mg | ORAL_TABLET | Freq: Every day | ORAL | Status: AC

## 2015-07-31 NOTE — Progress Notes (Signed)
Subjective:    Patient ID: Gabriella Nguyen, female    DOB: [birth year], 60 y.o.   MRN: [id number]  HPI   Gabriella Nguyen is a 60 y.o.female here today complaining of bilateral lower back pain, which started about 1-2 weeks ago, pain is described as aching, no radiated.   She had a fall about 4 weeks ago, developed right shoulder and hip pain, reporting x-rays done and given a prescription for naproxen. She didn't note back pain at this time. She denies any other injury or unusual level of activity.  Pain is 7/10 in intensity, with no associated LE numbness, tingling, urinary incontinence or retention, stool incontinence, or saddle anesthesia. No rash or edema on area, fever, or chills. She has not noted dysuria, gross hematuria, urinary urgency or frequency. She has prior hx of back pain.  She has tried naproxen she has left from prior prescription. Pain is exacerbated by prolonged sitting or standing, bending and alleviated by rest. When she takes naproxen pain goes away completely. At this time she has no pain, she took naproxen around 11 AM today.   Review of Systems  Constitutional: Negative for fever, activity change, appetite change, fatigue and unexpected weight change.  HENT: Negative for mouth sores, sore throat and trouble swallowing.   Respiratory: Negative for cough, shortness of breath and wheezing.   Cardiovascular: Negative for leg swelling.  Gastrointestinal: Negative for nausea, vomiting, abdominal pain and blood in stool.       Negative for changes in bowel habits or fecal incontinence.  Genitourinary: Negative for dysuria, hematuria, decreased urine volume, vaginal bleeding and vaginal discharge.       Negative for urine incontinence.  Musculoskeletal: Positive for back pain. Negative for joint swelling, arthralgias and neck pain.  Skin: Negative for rash.  Neurological: Negative for weakness, numbness and headaches.  Hematological: Negative for adenopathy.    Psychiatric/Behavioral: Negative.      Current Outpatient Prescriptions on File Prior to Visit  Medication Sig Dispense Refill  . acetaminophen (TYLENOL) 325 MG tablet Take 650 mg by mouth every 6 (six) hours as needed. For pain    . albuterol (PROVENTIL,VENTOLIN) 90 MCG/ACT inhaler Inhale 2 puffs into the lungs every 6 (six) hours as needed.    . budesonide-formoterol (SYMBICORT) 80-4.5 MCG/ACT inhaler Inhale 2 puffs into the lungs 2 (two) times daily.    . carvedilol (COREG) 25 MG tablet TAKE 1 TABLET BY MOUTH TWICE A DAY WITH A MEAL 60 tablet 0  . Diclofenac Sodium (PENNSAID) 2 % SOLN Place 1 application onto the skin 2 (two) times daily as needed. 112 g 1  . Glucosamine 500 MG TABS Take by mouth daily.    . hydrochlorothiazide (HYDRODIURIL) 25 MG tablet TAKE ONE (1) TABLET EACH DAY 7 tablet 0  . HYDROcodone-acetaminophen (NORCO/VICODIN) 5-325 MG per tablet Take 1 tablet by mouth every 6 (six) hours as needed for moderate pain. 60 tablet 0  . levocetirizine (XYZAL) 5 MG tablet TAKE 1 TABLET (5 MG TOTAL) BY MOUTH EVERY EVENING. 30 tablet 3  . losartan (COZAAR) 100 MG tablet TAKE 1 TABLET (100 MG TOTAL) BY MOUTH DAILY. 7 tablet 0  . Multiple Vitamins-Calcium (ONE-A-DAY WOMENS PO) Take by mouth daily.    Marland Kitchen omega-3 acid ethyl esters (LOVAZA) 1 G capsule Take 2 g by mouth daily.    . VENTOLIN HFA 108 (90 BASE) MCG/ACT inhaler INHALE 2 PUFFS INTO THE LUNGS EVERY 6 HOURS AS NEEDED FOR WHEEZING. 18 each 2  .  zafirlukast (ACCOLATE) 20 MG tablet TAKE 1 TABLET BY MOUTH TWICE A DAY 60 tablet 4  . promethazine-codeine (PHENERGAN WITH CODEINE) 6.25-10 MG/5ML syrup TAKE 5ML'S BY MOUTH EVERY 4 HOURS AS NEEDED FOR COUGH (Patient not taking: Reported on 07/31/2015) 118 mL 0   No current facility-administered medications on file prior to visit.     Past Medical History  Diagnosis Date  . ASTHMA   . OSTEOARTHRITIS, KNEES, BILATERAL   . Unspecified essential hypertension   . Aspiration pneumonia (HCC)  06/2011 hosp    Social History   Social History  . Marital Status: Single    Spouse Name: N/A  . Number of Children: 1  . Years of Education: 14   Occupational History  . Bus driver    Social History Main Topics  . Smoking status: Former Smoker -- 2 years    Types: Cigarettes    Start date: 08/23/1984  . Smokeless tobacco: Former NeurosurgeonUser    Quit date: 03/13/1984  . Alcohol Use: 1.0 oz/week    2 Standard drinks or equivalent per week     Comment: beer or wine  . Drug Use: No  . Sexual Activity: Not Asked   Other Topics Concern  . None   Social History Narrative   HSG, 2 years college. Married '85 -divorced '95. Son '89. Work - [employer]. Lives with son and SO.   Fun: [employer], travel ([place]    Denies any religious beliefs effecting health care.     Filed Vitals:   07/31/15 1615  BP: 122/84  Pulse: 98  Temp: 98.5 F (36.9 C)  Resp: 14   Body mass index is 32.41 kg/(m^2).       Objective:   Physical Exam  Constitutional: She is oriented to person, place, and time. She appears well-developed. No distress.  HENT:  Head: Atraumatic.  Mouth/Throat: Mucous membranes are normal. No oral lesions.  Eyes: Conjunctivae are normal.  Cardiovascular:  Pulses:      Dorsalis pedis pulses are 2+ on the right side, and 2+ on the left side.  Pulmonary/Chest: Effort normal and breath sounds normal. She has no wheezes. She has no rhonchi. She has no rales.  Abdominal: Soft. She exhibits no mass. There is no tenderness. There is no CVA tenderness.  Musculoskeletal: She exhibits no edema or tenderness.       Lumbar back: She exhibits no tenderness, no edema, no pain and no spasm.  Mild pain upon palpation of lumbar paraspinal muscle, right L2-L3, mild muscle spasm.  Neurological: She is alert and oriented to person, place, and time. She has normal strength. No sensory deficit. Coordination and gait normal.  Reflex Scores:      Patellar reflexes are 2+ on the right side and 2+ on  the left side.      Achilles reflexes are 2+ on the right side and 2+ on the left side. SLR negative bilateral. L4,L5,S1 grossly intact.  Skin: Skin is warm. No lesion and no rash noted.  Psychiatric: She has a normal mood and affect. Her speech is normal.       Assessment & Plan:    Jenel LucksRoberta was seen today for back pain.  Diagnoses and all orders for this visit:  Acute bilateral low back pain without sciatica -     cyclobenzaprine (FLEXERIL) 10 MG tablet; Take 1 tablet (10 mg total) by mouth at bedtime.  Back pain seems to be musculoskeletal, I don't think imaging is needed at this time but  might be necessary if pain is persistent or getting worse. She was clearly instructed about warning signs.  Flexeril might help, recommended taking it at night as needed. Some side effects of naproxen discussed, recommended taking it one daily as needed and for no longer than 7 days. Chiropractor evaluation/treatment might help and/or physical therapy, the latter can be arranged if pain becomes persistent (2-3 weeks). Local heat and relative rest recommended.  Follow-up as needed.    -Patient advised to return or notify a doctor immediately if symptoms worsen or persist or new concerns arise.    Bertel Venard G. [place], MD  [facility]. [place] office.

## 2015-07-31 NOTE — Patient Instructions (Signed)
A few things to remember from today's visit:   1. Acute bilateral low back pain without sciatica Most likely muscle related. Caution with Naproxen due to blood pressure. Local heat and avoid prolonged sitting or standing.   If fever, rash,worsenig pain, or numbness/weakness/urine or stool incontinence go to ER.  - cyclobenzaprine (FLEXERIL) 10 MG tablet; Take 1 tablet (10 mg total) by mouth at bedtime.  Dispense: 15 tablet; Refill: 0      If you sign-up for My chart, you can communicate easier with us in case you have any question or concern.

## 2015-10-20 HISTORY — PX: OTHER SURGICAL HISTORY: SHX169

## 2016-01-26 ENCOUNTER — Other Ambulatory Visit: Payer: Self-pay | Admitting: Family

## 2016-08-01 ENCOUNTER — Other Ambulatory Visit: Payer: Self-pay | Admitting: Family

## 2016-08-23 ENCOUNTER — Telehealth: Payer: Self-pay | Admitting: *Deleted

## 2016-08-23 MED ORDER — ZAFIRLUKAST 20 MG PO TABS: 20.0000 mg | ORAL_TABLET | Freq: Two times a day (BID) | ORAL | 0 refills | Status: DC

## 2016-08-23 NOTE — Telephone Encounter (Signed)
Pt left msg on triage stating she has made appt for 08/30/16, but needing refill on her zafirlukast. Per office policy since appt has been made will send 30 day script to local pharmacy until appt...Raechel Chute/lmb

## 2016-08-30 ENCOUNTER — Ambulatory Visit (INDEPENDENT_AMBULATORY_CARE_PROVIDER_SITE_OTHER): Payer: BLUE CROSS/BLUE SHIELD | Admitting: Family

## 2016-08-30 ENCOUNTER — Encounter: Payer: Self-pay | Admitting: Family

## 2016-08-30 VITALS — BP 122/80 | HR 82 | Temp 98.9°F | Resp 16 | Ht 72.0 in | Wt 223.4 lb

## 2016-08-30 DIAGNOSIS — I10 Essential (primary) hypertension: Secondary | ICD-10-CM | POA: Diagnosis not present

## 2016-08-30 DIAGNOSIS — Z0001 Encounter for general adult medical examination with abnormal findings: Secondary | ICD-10-CM | POA: Insufficient documentation

## 2016-08-30 DIAGNOSIS — Z0000 Encounter for general adult medical examination without abnormal findings: Secondary | ICD-10-CM | POA: Diagnosis not present

## 2016-08-30 NOTE — Patient Instructions (Addendum)
Thank you for choosing [facility].  SUMMARY AND INSTRUCTIONS:  Please follow up for your colonoscopy and Hepatitis screening.   Please provide [place] a copy of the labs when you are able.   Continue to exercise as able.   Medication:  Please continue to take your medication as prescribed.   Follow up:  If your symptoms worsen or fail to improve, please contact our office for further instruction, or in case of emergency go directly to the emergency room at the closest medical facility.    Health Maintenance, Female Adopting a healthy lifestyle and getting preventive care can go a long way to promote health and wellness. Talk with your health care provider about what schedule of regular examinations is right for you. This is a good chance for you to check in with your provider about disease prevention and staying healthy. In between checkups, there are plenty of things you can do on your own. Experts have done a lot of research about which lifestyle changes and preventive measures are most likely to keep you healthy. Ask your health care provider for more information. Weight and diet Eat a healthy diet  Be sure to include plenty of vegetables, fruits, low-fat dairy products, and lean protein.  Do not eat a lot of foods high in solid fats, added sugars, or salt.  Get regular exercise. This is one of the most important things you can do for your health. ? Most adults should exercise for at least 150 minutes each week. The exercise should increase your heart rate and make you sweat (moderate-intensity exercise). ? Most adults should also do strengthening exercises at least twice a week. This is in addition to the moderate-intensity exercise.  Maintain a healthy weight  Body mass index (BMI) is a measurement that can be used to identify possible weight problems. It estimates body fat based on height and weight. Your health care provider can help determine your BMI and help you  achieve or maintain a healthy weight.  For females 41 years of age and older: ? A BMI below 18.5 is considered underweight. ? A BMI of 18.5 to 24.9 is normal. ? A BMI of 25 to 29.9 is considered overweight. ? A BMI of 30 and above is considered obese.  Watch levels of cholesterol and blood lipids  You should start having your blood tested for lipids and cholesterol at 62 years of age, then have this test every 5 years.  You may need to have your cholesterol levels checked more often if: ? Your lipid or cholesterol levels are high. ? You are older than 62 years of age. ? You are at high risk for heart disease.  Cancer screening Lung Cancer  Lung cancer screening is recommended for adults 57-21 years old who are at high risk for lung cancer because of a history of smoking.  A yearly low-dose CT scan of the lungs is recommended for people who: ? Currently smoke. ? Have quit within the past 15 years. ? Have at least a 30-pack-year history of smoking. A pack year is smoking an average of one pack of cigarettes a day for 1 year.  Yearly screening should continue until it has been 15 years since you quit.  Yearly screening should stop if you develop a health problem that would prevent you from having lung cancer treatment.  Breast Cancer  Practice breast self-awareness. This means understanding how your breasts normally appear and feel.  It also means doing regular breast self-exams.  Let your health care provider know about any changes, no matter how small.  If you are in your 20s or 30s, you should have a clinical breast exam (CBE) by a health care provider every 1-3 years as part of a regular health exam.  If you are 28 or older, have a CBE every year. Also consider having a breast X-ray (mammogram) every year.  If you have a family history of breast cancer, talk to your health care provider about genetic screening.  If you are at high risk for breast cancer, talk to your health  care provider about having an MRI and a mammogram every year.  Breast cancer gene (BRCA) assessment is recommended for women who have family members with BRCA-related cancers. BRCA-related cancers include: ? Breast. ? Ovarian. ? Tubal. ? Peritoneal cancers.  Results of the assessment will determine the need for genetic counseling and BRCA1 and BRCA2 testing.  Cervical Cancer Your health care provider may recommend that you be screened regularly for cancer of the pelvic organs (ovaries, uterus, and vagina). This screening involves a pelvic examination, including checking for microscopic changes to the surface of your cervix (Pap test). You may be encouraged to have this screening done every 3 years, beginning at age 51.  For women ages 38-65, health care providers may recommend pelvic exams and Pap testing every 3 years, or they may recommend the Pap and pelvic exam, combined with testing for human papilloma virus (HPV), every 5 years. Some types of HPV increase your risk of cervical cancer. Testing for HPV may also be done on women of any age with unclear Pap test results.  Other health care providers may not recommend any screening for nonpregnant women who are considered low risk for pelvic cancer and who do not have symptoms. Ask your health care provider if a screening pelvic exam is right for you.  If you have had past treatment for cervical cancer or a condition that could lead to cancer, you need Pap tests and screening for cancer for at least 20 years after your treatment. If Pap tests have been discontinued, your risk factors (such as having a new sexual partner) need to be reassessed to determine if screening should resume. Some women have medical problems that increase the chance of getting cervical cancer. In these cases, your health care provider may recommend more frequent screening and Pap tests.  Colorectal Cancer  This type of cancer can be detected and often  prevented.  Routine colorectal cancer screening usually begins at 62 years of age and continues through 62 years of age.  Your health care provider may recommend screening at an earlier age if you have risk factors for colon cancer.  Your health care provider may also recommend using home test kits to check for hidden blood in the stool.  A small camera at the end of a tube can be used to examine your colon directly (sigmoidoscopy or colonoscopy). This is done to check for the earliest forms of colorectal cancer.  Routine screening usually begins at age 66.  Direct examination of the colon should be repeated every 5-10 years through 62 years of age. However, you may need to be screened more often if early forms of precancerous polyps or small growths are found.  Skin Cancer  Check your skin from head to toe regularly.  Tell your health care provider about any new moles or changes in moles, especially if there is a change in a mole's shape or color.  Also tell your health care provider if you have a mole that is larger than the size of a pencil eraser.  Always use sunscreen. Apply sunscreen liberally and repeatedly throughout the day.  Protect yourself by wearing long sleeves, pants, a wide-brimmed hat, and sunglasses whenever you are outside.  Heart disease, diabetes, and high blood pressure  High blood pressure causes heart disease and increases the risk of stroke. High blood pressure is more likely to develop in: ? People who have blood pressure in the high end of the normal range (130-139/85-89 mm Hg). ? People who are overweight or obese. ? People who are African American.  If you are 47-81 years of age, have your blood pressure checked every 3-5 years. If you are 28 years of age or older, have your blood pressure checked every year. You should have your blood pressure measured twice-once when you are at a hospital or clinic, and once when you are not at a hospital or clinic.  Record the average of the two measurements. To check your blood pressure when you are not at a hospital or clinic, you can use: ? An automated blood pressure machine at a pharmacy. ? A home blood pressure monitor.  If you are between 84 years and 33 years old, ask your health care provider if you should take aspirin to prevent strokes.  Have regular diabetes screenings. This involves taking a blood sample to check your fasting blood sugar level. ? If you are at a normal weight and have a low risk for diabetes, have this test once every three years after 62 years of age. ? If you are overweight and have a high risk for diabetes, consider being tested at a younger age or more often. Preventing infection Hepatitis B  If you have a higher risk for hepatitis B, you should be screened for this virus. You are considered at high risk for hepatitis B if: ? You were born in a country where hepatitis B is common. Ask your health care provider which countries are considered high risk. ? Your parents were born in a high-risk country, and you have not been immunized against hepatitis B (hepatitis B vaccine). ? You have HIV or AIDS. ? You use needles to inject street drugs. ? You live with someone who has hepatitis B. ? You have had sex with someone who has hepatitis B. ? You get hemodialysis treatment. ? You take certain medicines for conditions, including cancer, organ transplantation, and autoimmune conditions.  Hepatitis C  Blood testing is recommended for: ? Everyone born from [birth year] through 1966. ? Anyone with known risk factors for hepatitis C.  Sexually transmitted infections (STIs)  You should be screened for sexually transmitted infections (STIs) including gonorrhea and chlamydia if: ? You are sexually active and are younger than 62 years of age. ? You are older than 62 years of age and your health care provider tells you that you are at risk for this type of infection. ? Your sexual  activity has changed since you were last screened and you are at an increased risk for chlamydia or gonorrhea. Ask your health care provider if you are at risk.  If you do not have HIV, but are at risk, it may be recommended that you take a prescription medicine daily to prevent HIV infection. This is called pre-exposure prophylaxis (PrEP). You are considered at risk if: ? You are sexually active and do not regularly use condoms or know the HIV status of  your partner(s). ? You take drugs by injection. ? You are sexually active with a partner who has HIV.  Talk with your health care provider about whether you are at high risk of being infected with HIV. If you choose to begin PrEP, you should first be tested for HIV. You should then be tested every 3 months for as long as you are taking PrEP. Pregnancy  If you are premenopausal and you may become pregnant, ask your health care provider about preconception counseling.  If you may become pregnant, take 400 to 800 micrograms (mcg) of folic acid every day.  If you want to prevent pregnancy, talk to your health care provider about birth control (contraception). Osteoporosis and menopause  Osteoporosis is a disease in which the bones lose minerals and strength with aging. This can result in serious bone fractures. Your risk for osteoporosis can be identified using a bone density scan.  If you are 33 years of age or older, or if you are at risk for osteoporosis and fractures, ask your health care provider if you should be screened.  Ask your health care provider whether you should take a calcium or vitamin D supplement to lower your risk for osteoporosis.  Menopause may have certain physical symptoms and risks.  Hormone replacement therapy may reduce some of these symptoms and risks. Talk to your health care provider about whether hormone replacement therapy is right for you. Follow these instructions at home:  Schedule regular health, dental,  and eye exams.  Stay current with your immunizations.  Do not use any tobacco products including cigarettes, chewing tobacco, or electronic cigarettes.  If you are pregnant, do not drink alcohol.  If you are breastfeeding, limit how much and how often you drink alcohol.  Limit alcohol intake to no more than 1 drink per day for nonpregnant women. One drink equals 12 ounces of beer, 5 ounces of wine, or 1 ounces of hard liquor.  Do not use street drugs.  Do not share needles.  Ask your health care provider for help if you need support or information about quitting drugs.  Tell your health care provider if you often feel depressed.  Tell your health care provider if you have ever been abused or do not feel safe at home. This information is not intended to replace advice given to you by your health care provider. Make sure you discuss any questions you have with your health care provider. Document Released: 09/27/2010 Document Revised: 08/20/2015 Document Reviewed: 12/16/2014 Elsevier Interactive Patient Education  [employer].

## 2016-08-30 NOTE — Assessment & Plan Note (Signed)
Blood pressure adequately controlled and below goal 140/90 with current medication regimen and no adverse side effects. Denies worst headache of life with no new symptoms of end organ damage noted on physical exam. Continue current dosage of carvedilol and hydrochlorothiazide. Encouraged to monitor blood pressure at home and follow low-sodium diet.

## 2016-08-30 NOTE — Progress Notes (Signed)
Subjective:    Patient ID: Gabriella Nguyen, female    DOB: Jan 08, 1956, 61 y.o.   MRN: [id number]  Chief Complaint  Patient presents with  . CPE    not fasting    HPI:  Gabriella Nguyen is a 61 y.o. female who presents today for an annual wellness visit.   1) Health Maintenance -   Diet - Averages about 2-3 meals per day consisting of a regular diet; Caffeine intake of 1-2 cups per day  Exercise - As much as possible; walking; limited on occasion secondary to knee replacement   2) Preventative Exams / Immunizations:  Dental -- Due for exam   Vision --  Up to date   Health Maintenance  Topic Date Due  . Hepatitis C Screening  Jul 19, 1955  . HIV Screening  07/24/1969  . TETANUS/TDAP  07/24/1973  . COLONOSCOPY  08/28/2017 (Originally 07/24/2004)  . INFLUENZA VACCINE  10/26/2016  . PAP SMEAR  07/27/2017  . MAMMOGRAM  01/26/2018    Immunization History  Administered Date(s) Administered  . Influenza Whole 12/30/2009     Allergies  Allergen Reactions  . Nifedipine      Outpatient Medications Prior to Visit  Medication Sig Dispense Refill  . acetaminophen (TYLENOL) 325 MG tablet Take 650 mg by mouth every 6 (six) hours as needed. For pain    . albuterol (PROVENTIL,VENTOLIN) 90 MCG/ACT inhaler Inhale 2 puffs into the lungs every 6 (six) hours as needed.    . budesonide-formoterol (SYMBICORT) 80-4.5 MCG/ACT inhaler Inhale 2 puffs into the lungs 2 (two) times daily.    . carvedilol (COREG) 25 MG tablet TAKE 1 TABLET BY MOUTH TWICE A DAY WITH A MEAL 60 tablet 0  . Diclofenac Sodium (PENNSAID) 2 % SOLN Place 1 application onto the skin 2 (two) times daily as needed. 112 g 1  . hydrochlorothiazide (HYDRODIURIL) 25 MG tablet TAKE ONE (1) TABLET EACH DAY 7 tablet 0  . losartan (COZAAR) 100 MG tablet TAKE 1 TABLET (100 MG TOTAL) BY MOUTH DAILY. 7 tablet 0  . Multiple Vitamins-Calcium (ONE-A-DAY WOMENS PO) Take by mouth daily.    . VENTOLIN HFA 108 (90 BASE) MCG/ACT inhaler  INHALE 2 PUFFS INTO THE LUNGS EVERY 6 HOURS AS NEEDED FOR WHEEZING. 18 each 2  . zafirlukast (ACCOLATE) 20 MG tablet Take 1 tablet (20 mg total) by mouth 2 (two) times daily. Must keep June 5th appt for future refills 60 tablet 0  . Glucosamine 500 MG TABS Take by mouth daily.    Marland Kitchen HYDROcodone-acetaminophen (NORCO/VICODIN) 5-325 MG per tablet Take 1 tablet by mouth every 6 (six) hours as needed for moderate pain. 60 tablet 0  . levocetirizine (XYZAL) 5 MG tablet TAKE 1 TABLET (5 MG TOTAL) BY MOUTH EVERY EVENING. 30 tablet 3  . omega-3 acid ethyl esters (LOVAZA) 1 G capsule Take 2 g by mouth daily.    . promethazine-codeine (PHENERGAN WITH CODEINE) 6.25-10 MG/5ML syrup TAKE 5ML'S BY MOUTH EVERY 4 HOURS AS NEEDED FOR COUGH (Patient not taking: Reported on 07/31/2015) 118 mL 0   No facility-administered medications prior to visit.      Past Medical History:  Diagnosis Date  . Aspiration pneumonia (HCC) 06/2011 hosp  . ASTHMA   . OSTEOARTHRITIS, KNEES, BILATERAL   . Unspecified essential hypertension      Past Surgical History:  Procedure Laterality Date  . CESAREAN SECTION  1989  . hand surgery-right Right 1990   at base of right thumb  . Right total  knee    . TONSILLECTOMY     at age 4     Family History  Problem Relation Age of Onset  . Dementia Mother   . Hypertension Mother   . Heart disease Father   . COPD Father      Social History   Social History  . Marital status: Single    Spouse name: N/A  . Number of children: 1  . Years of education: 61   Occupational History  . Bus driver [employer]   Social History Main Topics  . Smoking status: Former Smoker    Years: 2.00    Types: Cigarettes    Start date: 08/23/1984  . Smokeless tobacco: Former Neurosurgeon    Quit date: 03/13/1984  . Alcohol use 1.0 oz/week    2 Standard drinks or equivalent per week     Comment: beer or wine  . Drug use: No  . Sexual activity: Not on file   Other Topics Concern  . Not  on file   Social History Narrative   HSG, 2 years college. Married '85 -divorced '95. Son '89. Work - [employer]. Lives with son and SO.   Fun: Adriana Sim[place], travel ([place] ideal).    Denies any religious beliefs effecting health care.       Review of Systems  Constitutional: Denies fever, chills, fatigue, or significant weight gain/loss. HENT: Head: Denies headache or neck pain Ears: Denies changes in hearing, ringing in ears, earache, drainage Nose: Denies discharge, stuffiness, itching, nosebleed, sinus pain Throat: Denies sore throat, hoarseness, dry mouth, sores, thrush Eyes: Denies loss/changes in vision, pain, redness, blurry/double vision, flashing lights Cardiovascular: Denies chest pain/discomfort, tightness, palpitations, shortness of breath with activity, difficulty lying down, swelling, sudden awakening with shortness of breath Respiratory: Denies shortness of breath, cough, sputum production, wheezing Gastrointestinal: Denies dysphasia, heartburn, change in appetite, nausea, change in bowel habits, rectal bleeding, constipation, diarrhea, yellow skin or eyes Genitourinary: Denies frequency, urgency, burning/pain, blood in urine, incontinence, change in urinary strength. Musculoskeletal: Denies muscle/joint pain, stiffness, back pain, redness or swelling of joints, trauma Skin: Denies rashes, lumps, itching, dryness, color changes, or hair/nail changes Neurological: Denies dizziness, fainting, seizures, weakness, numbness, tingling, tremor Psychiatric - Denies nervousness, stress, depression or memory loss Endocrine: Denies heat or cold intolerance, sweating, frequent urination, excessive thirst, changes in appetite Hematologic: Denies ease of bruising or bleeding     Objective:     BP 122/80 (BP Location: Left Arm, Patient Position: Sitting, Cuff Size: Large)   Pulse 82   Temp 98.9 F (37.2 C) (Oral)   Resp 16   Ht 6' (1.829 m)   Wt 223 lb 6.4 oz (101.3 kg)   SpO2 97%   BMI  30.30 kg/m  Nursing note and vital signs reviewed.  Physical Exam  Constitutional: She is oriented to person, place, and time. She appears well-developed and well-nourished.  HENT:  Head: Normocephalic.  Right Ear: Hearing, tympanic membrane, external ear and ear canal normal.  Left Ear: Hearing, tympanic membrane, external ear and ear canal normal.  Nose: Nose normal.  Mouth/Throat: Uvula is midline, oropharynx is clear and moist and mucous membranes are normal.  Eyes: Conjunctivae and EOM are normal. Pupils are equal, round, and reactive to light.  Neck: Neck supple. No JVD present. No tracheal deviation present. No thyromegaly present.  Cardiovascular: Normal rate, regular rhythm, normal heart sounds and intact distal pulses.   Pulmonary/Chest: Effort normal and breath sounds normal.  Abdominal: Soft. Bowel sounds are  normal. She exhibits no distension and no mass. There is no tenderness. There is no rebound and no guarding.  Musculoskeletal: Normal range of motion. She exhibits no edema or tenderness.  Lymphadenopathy:    She has no cervical adenopathy.  Neurological: She is alert and oriented to person, place, and time. She has normal reflexes. No cranial nerve deficit. She exhibits normal muscle tone. Coordination normal.  Skin: Skin is warm and dry.  Psychiatric: She has a normal mood and affect. Her behavior is normal. Judgment and thought content normal.       Assessment & Plan:   Problem List Items Addressed This Visit      Cardiovascular and Mediastinum   Essential hypertension    Blood pressure adequately controlled and below goal 140/90 with current medication regimen and no adverse side effects. Denies worst headache of life with no new symptoms of end organ damage noted on physical exam. Continue current dosage of carvedilol and hydrochlorothiazide. Encouraged to monitor blood pressure at home and follow low-sodium diet.        Other   Encounter for general adult  medical examination with abnormal findings - Primary    1) Anticipatory Guidance: Discussed importance of wearing a seatbelt while driving and not texting while driving; changing batteries in smoke detector at least once annually; wearing suntan lotion when outside; eating a balanced and moderate diet; getting physical activity at least 30 minutes per day.  2) Immunizations / Screenings / Labs:  Declines tetanus. All other immunizations are up-to-date per recommendations. Due for hepatitis C screening which she will complete through the [facility]. Due for colonoscopy which will also be completed to the [facility]. Cervical cancer screening and breast cancer screening up-to-date per recommendations. Due for dental exam encouraged to be completed independently. All other screenings are up-to-date per recommendations. Obtain CBC, CMET, and lipid profile.    Overall well exam with risk factors for cardiovascular disease including hypertension. Blood pressure appears adequately controlled current medication regimen and no adverse side effects. She does have some difficulty with exercise secondary to previous total knee replacement. In the process of having a reevaluation completed. Continue other healthy lifestyle behaviors and choices. Follow-up prevention exam in 1 year. Follow-up office visit as needed for chronic conditions as she is also followed by the [facility].           I have discontinued Ms. Pledger's omega-3 acid ethyl esters, HYDROcodone-acetaminophen, levocetirizine, and promethazine-codeine. I am also having her maintain her acetaminophen, Multiple Vitamins-Calcium (ONE-A-DAY WOMENS PO), Glucosamine, albuterol, carvedilol, VENTOLIN HFA, budesonide-formoterol, losartan, hydrochlorothiazide, Diclofenac Sodium, and zafirlukast.   Follow-up: Return in about 6 months (around 03/01/2017), or if symptoms worsen or fail to improve.   Jeanine Luzalone, Norval Slaven,  FNP

## 2016-08-30 NOTE — Assessment & Plan Note (Addendum)
1) Anticipatory Guidance: Discussed importance of wearing a seatbelt while driving and not texting while driving; changing batteries in smoke detector at least once annually; wearing suntan lotion when outside; eating a balanced and moderate diet; getting physical activity at least 30 minutes per day.  2) Immunizations / Screenings / Labs:  Declines tetanus. All other immunizations are up-to-date per recommendations. Due for hepatitis C screening which she will complete through the [facility]. Due for colonoscopy which will also be completed to the [facility]. Cervical cancer screening and breast cancer screening up-to-date per recommendations. Due for dental exam encouraged to be completed independently. All other screenings are up-to-date per recommendations. Obtain CBC, CMET, and lipid profile.    Overall well exam with risk factors for cardiovascular disease including hypertension. Blood pressure appears adequately controlled current medication regimen and no adverse side effects. She does have some difficulty with exercise secondary to previous total knee replacement. In the process of having a reevaluation completed. Continue other healthy lifestyle behaviors and choices. Follow-up prevention exam in 1 year. Follow-up office visit as needed for chronic conditions as she is also followed by the [facility].

## 2016-09-19 ENCOUNTER — Other Ambulatory Visit: Payer: Self-pay | Admitting: Family

## 2016-09-24 ENCOUNTER — Other Ambulatory Visit: Payer: Self-pay | Admitting: Family

## 2016-10-11 ENCOUNTER — Ambulatory Visit (INDEPENDENT_AMBULATORY_CARE_PROVIDER_SITE_OTHER): Payer: BLUE CROSS/BLUE SHIELD | Admitting: Family

## 2016-10-11 ENCOUNTER — Encounter: Payer: Self-pay | Admitting: Family

## 2016-10-11 VITALS — BP 124/78 | HR 77 | Temp 98.8°F | Resp 16 | Ht 72.0 in | Wt 227.0 lb

## 2016-10-11 DIAGNOSIS — L989 Disorder of the skin and subcutaneous tissue, unspecified: Secondary | ICD-10-CM | POA: Insufficient documentation

## 2016-10-11 MED ORDER — DOXYCYCLINE HYCLATE 100 MG PO TABS: 100.0000 mg | ORAL_TABLET | Freq: Two times a day (BID) | ORAL | 0 refills | Status: DC

## 2016-10-11 NOTE — Patient Instructions (Addendum)
Thank you for choosing [facility].  SUMMARY AND INSTRUCTIONS:  Please keep the site clean with soap and water.  Start the antibiotic.  Recommend marking the red area and continuing to monitor.   Follow up if symptoms worsen.   Medication:  Your prescription(s) have been submitted to your pharmacy or been printed and provided for you. Please take as directed and contact our office if you believe you are having problem(s) with the medication(s) or have any questions.  Follow up:  If your symptoms worsen or fail to improve, please contact our office for further instruction, or in case of emergency go directly to the emergency room at the closest medical facility.

## 2016-10-11 NOTE — Assessment & Plan Note (Signed)
New-onset skin lesion of undetermined source with questionable insect-related bites/stings. Blister popped prior to arrival. Marked surrounding site to determine continued spreading/worsening. Start doxycycline. Keep clean with soap and water and avoid alcohol and hydrogen peroxide. Follow-up if symptoms worsen or do not improve.

## 2016-10-11 NOTE — Progress Notes (Signed)
Subjective:    Patient ID: Gabriella Nguyen, female    DOB: [birth year], 61 y.o.   MRN: [id number]  Chief Complaint  Patient presents with  . Skin Lesion    has a lesion on left leg that she noticed on sunday, the lesion has grown since she first noticed it    HPI:  Gabriella Nguyen is a 61 y.o. female who  has a past medical history of Aspiration pneumonia (HCC) (06/2011 hosp); ASTHMA; OSTEOARTHRITIS, KNEES, BILATERAL; and Unspecified essential hypertension. and presents today for an acute office visit.   This is a new problem. Associated symptom of a lesion located on her left thigh has been going on for about 3 days. Believes it may be bite of some kind. Describes as elevated with a red base with a blister located on top of it. Modifying factors include alcohol wipes which has not helped. Symptoms may be improving slightly with clear discharge now noted.  Allergies  Allergen Reactions  . Nifedipine      Outpatient Medications Prior to Visit  Medication Sig Dispense Refill  . acetaminophen (TYLENOL) 325 MG tablet Take 650 mg by mouth every 6 (six) hours as needed. For pain    . albuterol (PROVENTIL,VENTOLIN) 90 MCG/ACT inhaler Inhale 2 puffs into the lungs every 6 (six) hours as needed.    . budesonide-formoterol (SYMBICORT) 80-4.5 MCG/ACT inhaler Inhale 2 puffs into the lungs 2 (two) times daily.    . carvedilol (COREG) 25 MG tablet TAKE 1 TABLET BY MOUTH TWICE A DAY WITH A MEAL 60 tablet 0  . Diclofenac Sodium (PENNSAID) 2 % SOLN Place 1 application onto the skin 2 (two) times daily as needed. 112 g 1  . Glucosamine 500 MG TABS Take by mouth daily.    . hydrochlorothiazide (HYDRODIURIL) 25 MG tablet TAKE ONE (1) TABLET EACH DAY 7 tablet 0  . losartan (COZAAR) 100 MG tablet TAKE 1 TABLET (100 MG TOTAL) BY MOUTH DAILY. 7 tablet 0  . Multiple Vitamins-Calcium (ONE-A-DAY WOMENS PO) Take by mouth daily.    . VENTOLIN HFA 108 (90 BASE) MCG/ACT inhaler INHALE 2 PUFFS INTO THE LUNGS EVERY 6  HOURS AS NEEDED FOR WHEEZING. 18 each 2  . zafirlukast (ACCOLATE) 20 MG tablet TAKE 1 TABLET (20 MG TOTAL) BY MOUTH 2 (TWO) TIMES DAILY. MUST KEEP JUNE 5TH APPT FOR FUTURE REFILLS 60 tablet 0   No facility-administered medications prior to visit.       Past Surgical History:  Procedure Laterality Date  . CESAREAN SECTION  1989  . hand surgery-right Right 1990   at base of right thumb  . Right total knee    . TONSILLECTOMY     at age 87      Past Medical History:  Diagnosis Date  . Aspiration pneumonia (HCC) 06/2011 hosp  . ASTHMA   . OSTEOARTHRITIS, KNEES, BILATERAL   . Unspecified essential hypertension       Review of Systems  Constitutional: Negative for chills and fever.  Cardiovascular: Negative for chest pain, palpitations and leg swelling.  Musculoskeletal: Negative for arthralgias and myalgias.  Skin: Positive for wound.  Neurological: Negative for weakness and numbness.      Objective:    BP 124/78 (BP Location: Left Arm, Patient Position: Sitting, Cuff Size: Large)   Pulse 77   Temp 98.8 F (37.1 C) (Oral)   Resp 16   Ht 6' (1.829 m)   Wt 227 lb (103 kg)   SpO2 96%   BMI  30.79 kg/m  Nursing note and vital signs reviewed.  Physical Exam  Constitutional: She is oriented to person, place, and time. She appears well-developed and well-nourished. No distress.  Cardiovascular: Normal rate, regular rhythm, normal heart sounds and intact distal pulses.   Pulmonary/Chest: Effort normal and breath sounds normal.  Neurological: She is alert and oriented to person, place, and time.  Skin: Skin is warm and dry.  Annular lesion with firm red skin at the base and a bulla located on the top producing serous type discharge. There is mild tenderness to the area.  Psychiatric: She has a normal mood and affect. Her behavior is normal. Judgment and thought content normal.       Assessment & Plan:   Problem List Items Addressed This Visit      Musculoskeletal and  Integument   Skin lesion - Primary    New-onset skin lesion of undetermined source with questionable insect-related bites/stings. Blister popped prior to arrival. Marked surrounding site to determine continued spreading/worsening. Start doxycycline. Keep clean with soap and water and avoid alcohol and hydrogen peroxide. Follow-up if symptoms worsen or do not improve.          I am having Ms. Naron start on doxycycline. I am also having her maintain her acetaminophen, Multiple Vitamins-Calcium (ONE-A-DAY WOMENS PO), Glucosamine, albuterol, carvedilol, VENTOLIN HFA, budesonide-formoterol, losartan, hydrochlorothiazide, Diclofenac Sodium, and zafirlukast.   Meds ordered this encounter  Medications  . doxycycline (VIBRA-TABS) 100 MG tablet    Sig: Take 1 tablet (100 mg total) by mouth 2 (two) times daily.    Dispense:  20 tablet    Refill:  0    Order Specific Question:   Supervising Provider    Answer:   Hillard DankerRAWFORD, ELIZABETH A [[id number]]     Follow-up: Return if symptoms worsen or fail to improve.  Jeanine Luzalone, Gregory, FNP

## 2016-10-21 ENCOUNTER — Telehealth: Payer: Self-pay | Admitting: Family

## 2016-10-21 MED ORDER — ZAFIRLUKAST 20 MG PO TABS: 20.0000 mg | ORAL_TABLET | Freq: Two times a day (BID) | ORAL | 0 refills | Status: DC

## 2016-10-21 NOTE — Telephone Encounter (Signed)
Pt called requesting a refill on zafirlukast (ACCOLATE) 20 MG tablet to be sent to [facility] on [address].

## 2016-10-21 NOTE — Telephone Encounter (Signed)
Called pt no answer LMOM rx has been [facility] to [facility].../lmb 

## 2016-12-13 ENCOUNTER — Ambulatory Visit: Payer: Self-pay | Admitting: Orthopedic Surgery

## 2016-12-13 NOTE — H&P (Signed)
Gabriella Nguyen DOB: 05/24/1955 Single / Language: [place] / Race: Black or African American Female  Date of Admission:  01/04/2017 CC:  Unstable right total knee History of Present Illness The patient is a 61 year old female who comes in for a preoperative History and Physical. The patient is scheduled for a right knee polyethylene revison versus total knee revision to be performed by Dr. Gus Rankin. Aluisio, MD at [facility] on 01-04-2017. The patient is a 61 year old female who presented with knee complaints ongoing for a year now. The patient presents with the chief complaint of bilateral knee pain. She reports that she is having left knee pain from osteoarthritis. She has had trouble with the left knee for a few years. She had cortisone injections previously which worked initially. She was scheduled for TKA last year. She reports that she was in preop, had the knee signed for procedure and the doctor suddently decided to replace the right knee instead. She did ok the first few weeks after the right TKA but shortly after started feeling some shifting and instability in the knee. She says that the right knee is still very sensitive anteriorly. She has done mulitple rounds of PT with no improvement. She has used various braces with no benefit. She is ready to get the left knee replaced but knows something has to be done about the right knee first. Her main problem with this right knee is pain and instability. She states that she did fairly well initially right after the knee replacement but soon developed an unstable feeling in the knee and also developed increasing pain. The knee is at now is limiting what she can and cannot do. It gives out on her frequently. She is at a stage where she feels she needs something done with this. She has been treated at the [facility] and was seen for a second opinion. Noted that she has not had any issues with fever or chills swelling or infectious type  symptoms. She is not having any pain in her right hip. Radiographs AP and lateral of the right knee showed that she has a fixed bearing tibial component with a stem in slight valgus. She has a normal alignment of the knee overall. There were no gross abnormalities on the x-rays. She has a significant problem in that right knee. She has a very unstable total knee. It is limiting what she can and cannot do. She does not have any signs or symptoms of infection. I do not feel that the prosthesis is loose. It is unstable but I do not think there is any loosening between the implant and bone. She has tried bracing which has not been successful. At this point the most predictable means of improving her pain and function is going to be revision arthroplasty. It may be as simple as a polyethylene revision or it may involve revision of the old components in order to improve stability. I went over both scenarios with her. At this point, the most predictable means of improving pain and function is revision of the total knee arthroplasty. The procedure, risks, potential complications and rehab course are discussed in detail and the patient elects to proceed. They have been treated conservatively in the past for the above stated problem and despite conservative measures, they continue to have progressive pain and severe functional limitations and dysfunction. They have failed non-operative management including home exercise, medications, and bracing. It is felt that they would benefit from  undergoing revision of the total joint replacement. Risks and benefits of the procedure have been discussed with the patient and they elect to proceed with surgery. There are no active contraindications to surgery such as ongoing infection or rapidly progressive neurological disease.  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 Heart disease in female family member before age 61  Hypertension  Mother. Osteoarthritis  Mother.  Social History Children  1 Current drinker  09/23/2016: Currently drinks wine Current work status  disabled Exercise  Exercises daily; does running / walking and individual sport Living situation  live alone Marital status  divorced No history of drug/alcohol rehab  Not under pain contract  Number of flights of stairs before winded  2-3 Tobacco / smoke exposure  09/23/2016: no Tobacco use  Never smoker. 09/23/2016  Medication History Aspirin (  Tablet, Oral) Active. Carvedilol (12.5MG  Tablet, Oral) Active. Losartan Potassium (  Tablet, Oral) Active. HydroCHLOROthiazide (  Tablet, Oral) Active. Symbicort (80-4.5MCG/ACT Aerosol, Inhalation) Active. ProAir HFA (108 (90 Base)MCG/ACT Aerosol Soln, Inhalation) Active. Vitamin D (Oral) Specific strength unknown - Active. Zafirlukast (  Tablet, Oral) Active. Naproxen (  Tablet, Oral) Active.  Past Surgical History Carpal Tunnel Repair  right Cesarean Delivery  1 time Tonsillectomy  Total Knee Replacement  right  Review of Systems  General Not Present- Chills, Fatigue, Fever, Memory Loss, Night Sweats, Weight Gain and Weight Loss. Skin Not Present- Eczema, Hives, Itching, Lesions and Rash. HEENT Not Present- Dentures, Double Vision, Headache, Hearing Loss, Tinnitus and Visual Loss. Respiratory Not Present- Allergies, Chronic Cough, Coughing up blood, Shortness of breath at rest and Shortness of breath with exertion. Cardiovascular Not Present- Chest Pain, Difficulty Breathing Lying Down, Murmur, Palpitations, Racing/skipping heartbeats and Swelling. Gastrointestinal Not  Present- Abdominal Pain, Bloody Stool, Constipation, Diarrhea, Difficulty Swallowing, Heartburn, Jaundice, Loss of appetitie, Nausea and Vomiting. Female Genitourinary Not Present- Blood in Urine, Discharge, Flank Pain, Incontinence, Painful Urination, Urgency, Urinary frequency, Urinary Retention, Urinating at Night and Weak urinary stream. Musculoskeletal Present- Joint Pain and Joint Swelling. Not Present- Back Pain, Morning Stiffness, Muscle Pain, Muscle Weakness and Spasms. Neurological Not Present- Blackout spells, Difficulty with balance, Dizziness, Paralysis, Tremor and Weakness. Psychiatric Not Present- Insomnia.  Vitals Weight: 223 lb Height: 72in Weight was reported by patient. Height was reported by patient. Body Surface Area: 2.23 m Body Mass Index: 30.24 kg/m  Pulse: 84 (Regular)  BP: 132/78 (Sitting, Left Arm, Standard)  Physical Exam General Mental Status -Alert, cooperative and good historian. General Appearance-pleasant, Not in acute distress. Orientation-Oriented X3. Build & Nutrition-Well nourished and Well developed.  Head and Neck Head-normocephalic, atraumatic . Neck Global Assessment - supple, no bruit auscultated on the right, no bruit auscultated on the left.  Eye Vision-Wears contact lenses. Pupil - Bilateral-Regular and Round. Motion - Bilateral-EOMI.  ENMT Note: partial upper denture plates   Chest and Lung Exam Auscultation Breath sounds - clear at anterior chest wall and clear at posterior chest wall. Adventitious sounds - No Adventitious sounds.  Cardiovascular Auscultation Rhythm - Regular rate and rhythm. Heart Sounds - S1 WNL and S2 WNL. Murmurs & Other Heart Sounds - Auscultation of the heart reveals - No Murmurs.  Abdomen Palpation/Percussion Tenderness - Abdomen is non-tender to palpation. Rigidity (guarding) -  Abdomen is soft. Auscultation Auscultation of the abdomen reveals - Bowel sounds  normal.  Female Genitourinary Note: Not done, not pertinent to present illness   Musculoskeletal Note: Evaluation of the left hip shows flexion to 120 rotation in 30 out 40 and abduction 40 without discomfort. There is no tenderness over the greater trochanter. There is no pain on provocative testing of the hip..Examination of the right hip shows flexion to 120 rotation in 30 abduction 40 and external rotation of 40. There is no tenderness over the greater trochanter. There is no pain on provocative testing of the hip.. Her right knee shows no effusion. There is no warmth about the right knee. Her range of motion is 0-115. She actually will hyperextend about 5 passively. She has market varus valgus laxity in extension and hyperextension. She has some AP laxity also but to a lesser degree. Her left knee shows no effusion with range of motion of 5-125. There is moderate crepitus on range of motion of the left knee with tenderness medial and lateral and no instability noted. Pulse sensation motor intact both lower extremities. She has a significant antalgic gait pattern on the right.  Radiographs AP and lateral of the right knee showed that she has a fixed bearing tibial component with a stem in slight valgus. She has a normal alignment of the knee overall. There were no gross abnormalities on the x-rays.  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 Wymer, [facility]  IV TXA  Anesthesia Issues: None  Patient was instructed on what medications to stop prior to surgery.  Signed electronically by Tyrell Brereton L Aamilah Augenstein, III PA-C 

## 2016-12-21 NOTE — Progress Notes (Signed)
Please place orders in EPIC as patient has a pre-op appointment on 12/28/2016! Thank you! 

## 2016-12-23 ENCOUNTER — Ambulatory Visit: Payer: Self-pay | Admitting: Orthopedic Surgery

## 2016-12-27 NOTE — Patient Instructions (Signed)
Gabriella Nguyen  12/27/2016   Your procedure is scheduled on: 01-04-17  Report to [facility]  Entrance Report to Admitting at 7:45 AM   Call this number if you have problems the morning of surgery  [phone]   Remember: ONLY 1 PERSON MAY GO WITH YOU TO SHORT STAY TO GET  READY MORNING OF YOUR SURGERY.  Do not eat food or drink liquids :After Midnight.     Take these medicines the morning of surgery with A SIP OF WATER: Carvedilol (Coreg)                                You may not have any metal on your body including hair pins and              piercings  Do not wear jewelry, make-up, lotions, powders or perfumes, deodorant             Do not wear nail polish.  Do not shave  48 hours prior to surgery.              Do not bring valuables to the hospital. [facility] IS NOT             RESPONSIBLE   FOR VALUABLES.  Contacts, dentures or bridgework may not be worn into surgery.  Leave suitcase in the car. After surgery it may be brought to your room.                  Please read over the following fact sheets you were given: _____________________________________________________________________             [facility] - Preparing for Surgery Before surgery, you can play an important role.  Because skin is not sterile, your skin needs to be as free of germs as possible.  You can reduce the number of germs on your skin by washing with CHG (chlorahexidine gluconate) soap before surgery.  CHG is an antiseptic cleaner which kills germs and bonds with the skin to continue killing germs even after washing. Please DO NOT use if you have an allergy to CHG or antibacterial soaps.  If your skin becomes reddened/irritated stop using the CHG and inform your nurse when you arrive at Short Stay. Do not shave (including legs and underarms) for at least 48 hours prior to the first CHG shower.  You may shave your face/neck. Please follow these instructions  carefully:  1.  Shower with CHG Soap the night before surgery and the  morning of Surgery.  2.  If you choose to wash your hair, wash your hair first as usual with your  normal  shampoo.  3.  After you shampoo, rinse your hair and body thoroughly to remove the  shampoo.                           4.  Use CHG as you would any other liquid soap.  You can apply chg directly  to the skin and wash                       Gently with a scrungie or clean washcloth.  5.  Apply the CHG Soap to your body ONLY FROM THE NECK DOWN.   Do not use on  face/ open                           Wound or open sores. Avoid contact with eyes, ears mouth and genitals (private parts).                       Wash face,  Genitals (private parts) with your normal soap.             6.  Wash thoroughly, paying special attention to the area where your surgery  will be performed.  7.  Thoroughly rinse your body with warm water from the neck down.  8.  DO NOT shower/wash with your normal soap after using and rinsing off  the CHG Soap.                9.  Pat yourself dry with a clean towel.            10.  Wear clean pajamas.            11.  Place clean sheets on your bed the night of your first shower and do not  sleep with pets. Day of Surgery : Do not apply any lotions/deodorants the morning of surgery.  Please wear clean clothes to the hospital/surgery center.  FAILURE TO FOLLOW THESE INSTRUCTIONS MAY RESULT IN THE CANCELLATION OF YOUR SURGERY PATIENT SIGNATURE_________________________________  NURSE SIGNATURE__________________________________  ________________________________________________________________________   Adam Phenix  An incentive spirometer is a tool that can help keep your lungs clear and active. This tool measures how well you are filling your lungs with each breath. Taking long deep breaths may help reverse or decrease the chance of developing breathing (pulmonary) problems (especially infection)  following:  A long period of time when you are unable to move or be active. BEFORE THE PROCEDURE   If the spirometer includes an indicator to show your best effort, your nurse or respiratory therapist will set it to a desired goal.  If possible, sit up straight or lean slightly forward. Try not to slouch.  Hold the incentive spirometer in an upright position. INSTRUCTIONS FOR USE  1. Sit on the edge of your bed if possible, or sit up as far as you can in bed or on a chair. 2. Hold the incentive spirometer in an upright position. 3. Breathe out normally. 4. Place the mouthpiece in your mouth and seal your lips tightly around it. 5. Breathe in slowly and as deeply as possible, raising the piston or the ball toward the top of the column. 6. Hold your breath for 3-5 seconds or for as long as possible. Allow the piston or ball to fall to the bottom of the column. 7. Remove the mouthpiece from your mouth and breathe out normally. 8. Rest for a few seconds and repeat Steps 1 through 7 at least 10 times every 1-2 hours when you are awake. Take your time and take a few normal breaths between deep breaths. 9. The spirometer may include an indicator to show your best effort. Use the indicator as a goal to work toward during each repetition. 10. After each set of 10 deep breaths, practice coughing to be sure your lungs are clear. If you have an incision (the cut made at the time of surgery), support your incision when coughing by placing a pillow or rolled up towels firmly against it. Once you are able to get out of bed, walk around indoors  and cough well. You may stop using the incentive spirometer when instructed by your caregiver.  RISKS AND COMPLICATIONS  Take your time so you do not get dizzy or light-headed.  If you are in pain, you may need to take or ask for pain medication before doing incentive spirometry. It is harder to take a deep breath if you are having pain. AFTER USE  Rest and  breathe slowly and easily.  It can be helpful to keep track of a log of your progress. Your caregiver can provide you with a simple table to help with this. If you are using the spirometer at home, follow these instructions: [place] IF:   You are having difficultly using the spirometer.  You have trouble using the spirometer as often as instructed.  Your pain medication is not giving enough relief while using the spirometer.  You develop fever of 100.5 F (38.1 C) or higher. SEEK IMMEDIATE MEDICAL CARE IF:   You cough up bloody sputum that had not been present before.  You develop fever of 102 F (38.9 C) or greater.  You develop worsening pain at or near the incision site. MAKE SURE YOU:   Understand these instructions.  Will watch your condition.  Will get help right away if you are not doing well or get worse. Document Released: 07/25/2006 Document Revised: 06/06/2011 Document Reviewed: 09/25/2006 Exit[employer] Patient Information 2014 Exit[employer], [place].   ________________________________________________________________________  WHAT IS A BLOOD TRANSFUSION? Blood Transfusion Information  A transfusion is the replacement of blood or some of its parts. Blood is made up of multiple cells which provide different functions.  Red blood cells carry oxygen and are used for blood loss replacement.  White blood cells fight against infection.  Platelets control bleeding.  Plasma helps clot blood.  Other blood products are available for specialized needs, such as hemophilia or other clotting disorders. BEFORE THE TRANSFUSION  Who gives blood for transfusions?   Healthy volunteers who are fully evaluated to make sure their blood is safe. This is blood bank blood. Transfusion therapy is the safest it has ever been in the practice of medicine. Before blood is taken from a donor, a complete history is taken to make sure that person has no history of diseases nor engages in  risky social behavior (examples are intravenous drug use or sexual activity with multiple partners). The donor's travel history is screened to minimize risk of transmitting infections, such as malaria. The donated blood is tested for signs of infectious diseases, such as HIV and hepatitis. The blood is then tested to be sure it is compatible with you in order to minimize the chance of a transfusion reaction. If you or a relative donates blood, this is often done in anticipation of surgery and is not appropriate for emergency situations. It takes many days to process the donated blood. RISKS AND COMPLICATIONS Although transfusion therapy is very safe and saves many lives, the main dangers of transfusion include:   Getting an infectious disease.  Developing a transfusion reaction. This is an allergic reaction to something in the blood you were given. Every precaution is taken to prevent this. The decision to have a blood transfusion has been considered carefully by your caregiver before blood is given. Blood is not given unless the benefits outweigh the risks. AFTER THE TRANSFUSION  Right after receiving a blood transfusion, you will usually feel much better and more energetic. This is especially true if your red blood cells have gotten low (  anemic). The transfusion raises the level of the red blood cells which carry oxygen, and this usually causes an energy increase.  The nurse administering the transfusion will monitor you carefully for complications. HOME CARE INSTRUCTIONS  No special instructions are needed after a transfusion. You may find your energy is better. Speak with your caregiver about any limitations on activity for underlying diseases you may have. SEEK MEDICAL CARE IF:   Your condition is not improving after your transfusion.  You develop redness or irritation at the intravenous (IV) site. SEEK IMMEDIATE MEDICAL CARE IF:  Any of the following symptoms occur over the next 12  hours:  Shaking chills.  You have a temperature by mouth above 102 F (38.9 C), not controlled by medicine.  Chest, back, or muscle pain.  People around you feel you are not acting correctly or are confused.  Shortness of breath or difficulty breathing.  Dizziness and fainting.  You get a rash or develop hives.  You have a decrease in urine output.  Your urine turns a dark color or changes to pink, red, or brown. Any of the following symptoms occur over the next 10 days:  You have a temperature by mouth above 102 F (38.9 C), not controlled by medicine.  Shortness of breath.  Weakness after normal activity.  The white part of the eye turns yellow (jaundice).  You have a decrease in the amount of urine or are urinating less often.  Your urine turns a dark color or changes to pink, red, or brown. Document Released: 03/11/2000 Document Revised: 06/06/2011 Document Reviewed: 10/29/2007 [facility] Patient Information 2014 [place], [place].  _______________________________________________________________________

## 2016-12-28 ENCOUNTER — Encounter (HOSPITAL_COMMUNITY)
Admission: RE | Admit: 2016-12-28 | Discharge: 2016-12-28 | Disposition: A | Discharge status: Home / Self Care | Payer: Non-veteran care | Source: Ambulatory Visit | Attending: Orthopedic Surgery | Admitting: Orthopedic Surgery

## 2016-12-28 ENCOUNTER — Encounter (HOSPITAL_COMMUNITY): Payer: Self-pay

## 2016-12-28 DIAGNOSIS — R9431 Abnormal electrocardiogram [ECG] [EKG]: Secondary | ICD-10-CM | POA: Diagnosis not present

## 2016-12-28 DIAGNOSIS — I10 Essential (primary) hypertension: Secondary | ICD-10-CM | POA: Diagnosis not present

## 2016-12-28 DIAGNOSIS — M25361 Other instability, right knee: Secondary | ICD-10-CM | POA: Insufficient documentation

## 2016-12-28 DIAGNOSIS — Z01818 Encounter for other preprocedural examination: Secondary | ICD-10-CM | POA: Insufficient documentation

## 2016-12-28 LAB — CBC
HCT: 38.6 % (ref 36.0–46.0)
Hemoglobin: 13.1 g/dL (ref 12.0–15.0)
MCH: 30.8 pg (ref 26.0–34.0)
MCHC: 33.9 g/dL (ref 30.0–36.0)
MCV: 90.8 fL (ref 78.0–100.0)
PLATELETS: 221 10*3/uL (ref 150–400)
RBC: 4.25 MIL/uL (ref 3.87–5.11)
RDW: 13 % (ref 11.5–15.5)
WBC: 5.9 10*3/uL (ref 4.0–10.5)

## 2016-12-28 LAB — COMPREHENSIVE METABOLIC PANEL
ALBUMIN: 3.9 g/dL (ref 3.5–5.0)
ALT: 12 U/L — AB (ref 14–54)
AST: 18 U/L (ref 15–41)
Alkaline Phosphatase: 62 U/L (ref 38–126)
Anion gap: 6 (ref 5–15)
BUN: 14 mg/dL (ref 6–20)
CHLORIDE: 102 mmol/L (ref 101–111)
CO2: 28 mmol/L (ref 22–32)
CREATININE: 0.61 mg/dL (ref 0.44–1.00)
Calcium: 9 mg/dL (ref 8.9–10.3)
GFR calc Af Amer: >60 mL/min (ref >60)
GFR calc non Af Amer: >60 mL/min (ref >60)
GLUCOSE: 94 mg/dL (ref 65–99)
POTASSIUM: 3.3 mmol/L — AB (ref 3.5–5.1)
Sodium: 136 mmol/L (ref 135–145)
Total Bilirubin: 0.5 mg/dL (ref 0.3–1.2)
Total Protein: 7 g/dL (ref 6.5–8.1)

## 2016-12-28 LAB — SURGICAL PCR SCREEN
MRSA, PCR: NEGATIVE
STAPHYLOCOCCUS AUREUS: NEGATIVE

## 2016-12-28 LAB — ABO/RH: ABO/RH(D): O POS

## 2016-12-28 LAB — APTT: APTT: 30 s (ref 24–36)

## 2016-12-28 LAB — PROTIME-INR
INR: 0.97
Prothrombin Time: 12.7 seconds (ref 11.4–15.2)

## 2016-12-30 NOTE — Progress Notes (Signed)
Final EKG done 12/28/16-epic

## 2017-01-01 NOTE — H&P (Signed)
Gabriella Nguyen DOB: 05/24/1955 Single / Language: [place] / Race: Black or African American Female  Date of Admission:  01/04/2017 CC:  Unstable right total knee History of Present Illness The patient is a 61 year old female who comes in for a preoperative History and Physical. The patient is scheduled for a right knee polyethylene revison versus total knee revision to be performed by Dr. Gus Rankin. Aluisio, MD at [facility] on 01-04-2017. The patient is a 61 year old female who presented with knee complaints ongoing for a year now. The patient presents with the chief complaint of bilateral knee pain. She reports that she is having left knee pain from osteoarthritis. She has had trouble with the left knee for a few years. She had cortisone injections previously which worked initially. She was scheduled for TKA last year. She reports that she was in preop, had the knee signed for procedure and the doctor suddently decided to replace the right knee instead. She did ok the first few weeks after the right TKA but shortly after started feeling some shifting and instability in the knee. She says that the right knee is still very sensitive anteriorly. She has done mulitple rounds of PT with no improvement. She has used various braces with no benefit. She is ready to get the left knee replaced but knows something has to be done about the right knee first. Her main problem with this right knee is pain and instability. She states that she did fairly well initially right after the knee replacement but soon developed an unstable feeling in the knee and also developed increasing pain. The knee is at now is limiting what she can and cannot do. It gives out on her frequently. She is at a stage where she feels she needs something done with this. She has been treated at the [facility] and was seen for a second opinion. Noted that she has not had any issues with fever or chills swelling or infectious type  symptoms. She is not having any pain in her right hip. Radiographs AP and lateral of the right knee showed that she has a fixed bearing tibial component with a stem in slight valgus. She has a normal alignment of the knee overall. There were no gross abnormalities on the x-rays. She has a significant problem in that right knee. She has a very unstable total knee. It is limiting what she can and cannot do. She does not have any signs or symptoms of infection. I do not feel that the prosthesis is loose. It is unstable but I do not think there is any loosening between the implant and bone. She has tried bracing which has not been successful. At this point the most predictable means of improving her pain and function is going to be revision arthroplasty. It may be as simple as a polyethylene revision or it may involve revision of the old components in order to improve stability. I went over both scenarios with her. At this point, the most predictable means of improving pain and function is revision of the total knee arthroplasty. The procedure, risks, potential complications and rehab course are discussed in detail and the patient elects to proceed. They have been treated conservatively in the past for the above stated problem and despite conservative measures, they continue to have progressive pain and severe functional limitations and dysfunction. They have failed non-operative management including home exercise, medications, and bracing. It is felt that they would benefit from  undergoing revision of the total joint replacement. Risks and benefits of the procedure have been discussed with the patient and they elect to proceed with surgery. There are no active contraindications to surgery such as ongoing infection or rapidly progressive neurological disease.  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 Heart disease in female family member before age 52  Hypertension  Mother. Osteoarthritis  Mother.  Social History Children  1 Current drinker  09/23/2016: Currently drinks wine Current work status  disabled Exercise  Exercises daily; does running / walking and individual sport Living situation  live alone Marital status  divorced No history of drug/alcohol rehab  Not under pain contract  Number of flights of stairs before winded  2-3 Tobacco / smoke exposure  09/23/2016: no Tobacco use  Never smoker. 09/23/2016  Medication History Aspirin (  Tablet, Oral) Active. Carvedilol (12.5MG  Tablet, Oral) Active. Losartan Potassium (  Tablet, Oral) Active. HydroCHLOROthiazide (  Tablet, Oral) Active. Symbicort (80-4.5MCG/ACT Aerosol, Inhalation) Active. ProAir HFA (108 (90 Base)MCG/ACT Aerosol Soln, Inhalation) Active. Vitamin D (Oral) Specific strength unknown - Active. Zafirlukast (  Tablet, Oral) Active. Naproxen (  Tablet, Oral) Active.  Past Surgical History Carpal Tunnel Repair  right Cesarean Delivery  1 time Tonsillectomy  Total Knee Replacement  right  Review of Systems  General Not Present- Chills, Fatigue, Fever, Memory Loss, Night Sweats, Weight Gain and Weight Loss. Skin Not Present- Eczema, Hives, Itching, Lesions and Rash. HEENT Not Present- Dentures, Double Vision, Headache, Hearing Loss, Tinnitus and Visual Loss. Respiratory Not Present- Allergies, Chronic Cough, Coughing up blood, Shortness of breath at rest and Shortness of breath with exertion. Cardiovascular Not Present- Chest Pain, Difficulty Breathing Lying Down, Murmur, Palpitations, Racing/skipping heartbeats and Swelling. Gastrointestinal  Not Present- Abdominal Pain, Bloody Stool, Constipation, Diarrhea, Difficulty Swallowing, Heartburn, Jaundice, Loss of appetitie, Nausea and Vomiting. Female Genitourinary Not Present- Blood in Urine, Discharge, Flank Pain, Incontinence, Painful Urination, Urgency, Urinary frequency, Urinary Retention, Urinating at Night and Weak urinary stream. Musculoskeletal Present- Joint Pain and Joint Swelling. Not Present- Back Pain, Morning Stiffness, Muscle Pain, Muscle Weakness and Spasms. Neurological Not Present- Blackout spells, Difficulty with balance, Dizziness, Paralysis, Tremor and Weakness. Psychiatric Not Present- Insomnia.  Vitals Weight: 223 lb Height: 72in Weight was reported by patient. Height was reported by patient. Body Surface Area: 2.23 m Body Mass Index: 30.24 kg/m  Pulse: 84 (Regular)  BP: 132/78 (Sitting, Left Arm, Standard)  Physical Exam General Mental Status -Alert, cooperative and good historian. General Appearance-pleasant, Not in acute distress. Orientation-Oriented X3. Build & Nutrition-Well nourished and Well developed.  Head and Neck Head-normocephalic, atraumatic . Neck Global Assessment - supple, no bruit auscultated on the right, no bruit auscultated on the left.  Eye Vision-Wears contact lenses. Pupil - Bilateral-Regular and Round. Motion - Bilateral-EOMI.  ENMT Note: partial upper denture plates   Chest and Lung Exam Auscultation Breath sounds - clear at anterior chest wall and clear at posterior chest wall. Adventitious sounds - No Adventitious sounds.  Cardiovascular Auscultation Rhythm - Regular rate and rhythm. Heart Sounds - S1 WNL and S2 WNL. Murmurs & Other Heart Sounds - Auscultation of the heart reveals - No Murmurs.  Abdomen Palpation/Percussion Tenderness - Abdomen is non-tender to palpation. Rigidity (guarding) -  Abdomen is soft. Auscultation Auscultation of the abdomen reveals - Bowel sounds  normal.  Female Genitourinary Note: Not done, not pertinent to present illness   Musculoskeletal Note: Evaluation of the left hip shows flexion to 120 rotation in 30 out 40 and abduction 40 without discomfort. There is no tenderness over the greater trochanter. There is no pain on provocative testing of the hip..Examination of the right hip shows flexion to 120 rotation in 30 abduction 40 and external rotation of 40. There is no tenderness over the greater trochanter. There is no pain on provocative testing of the hip.Marland Kitchen Her right knee shows no effusion. There is no warmth about the right knee. Her range of motion is 0-115. She actually will hyperextend about 5 passively. She has market varus valgus laxity in extension and hyperextension. She has some AP laxity also but to a lesser degree. Her left knee shows no effusion with range of motion of 5-125. There is moderate crepitus on range of motion of the left knee with tenderness medial and lateral and no instability noted. Pulse sensation motor intact both lower extremities. She has a significant antalgic gait pattern on the right.  Radiographs AP and lateral of the right knee showed that she has a fixed bearing tibial component with a stem in slight valgus. She has a normal alignment of the knee overall. There were no gross abnormalities on the x-rays.  Assessment & Plan  Status post total right knee replacement (Z61.096) Instability of internal right knee prosthesis (E45.409W)  Note:Surgical Plans: Right knee Polyethylene Revison versus Right Total Knee Revision  Disposition: Home with Sister  PCP: Dr. Karlyne [facility]  IV TXA  Anesthesia Issues: None  Patient was instructed on what medications to stop prior to surgery.  Signed electronically by Lauraine Rinne, III PA-C

## 2017-01-04 ENCOUNTER — Encounter (HOSPITAL_COMMUNITY)
Admission: RE | Disposition: A | Discharge status: Home Health | Payer: Self-pay | Source: Ambulatory Visit | Attending: Orthopedic Surgery

## 2017-01-04 ENCOUNTER — Inpatient Hospital Stay (HOSPITAL_COMMUNITY): Payer: Non-veteran care | Admitting: Certified Registered Nurse Anesthetist

## 2017-01-04 ENCOUNTER — Encounter (HOSPITAL_COMMUNITY): Payer: Self-pay | Admitting: *Deleted

## 2017-01-04 ENCOUNTER — Inpatient Hospital Stay (HOSPITAL_COMMUNITY)
Admission: RE | Admit: 2017-01-04 | Discharge: 2017-01-06 | DRG: 468 | Disposition: A | Discharge status: Home Health | Payer: Non-veteran care | Source: Ambulatory Visit | Attending: Orthopedic Surgery | Admitting: Orthopedic Surgery

## 2017-01-04 DIAGNOSIS — Z8261 Family history of arthritis: Secondary | ICD-10-CM | POA: Diagnosis not present

## 2017-01-04 DIAGNOSIS — Z96659 Presence of unspecified artificial knee joint: Secondary | ICD-10-CM

## 2017-01-04 DIAGNOSIS — T84022A Instability of internal right knee prosthesis, initial encounter: Principal | ICD-10-CM | POA: Diagnosis present

## 2017-01-04 DIAGNOSIS — J45909 Unspecified asthma, uncomplicated: Secondary | ICD-10-CM | POA: Diagnosis present

## 2017-01-04 DIAGNOSIS — Z791 Long term (current) use of non-steroidal anti-inflammatories (NSAID): Secondary | ICD-10-CM

## 2017-01-04 DIAGNOSIS — I10 Essential (primary) hypertension: Secondary | ICD-10-CM | POA: Diagnosis present

## 2017-01-04 DIAGNOSIS — Y792 Prosthetic and other implants, materials and accessory orthopedic devices associated with adverse incidents: Secondary | ICD-10-CM | POA: Diagnosis present

## 2017-01-04 DIAGNOSIS — G8929 Other chronic pain: Secondary | ICD-10-CM | POA: Diagnosis present

## 2017-01-04 DIAGNOSIS — Z87891 Personal history of nicotine dependence: Secondary | ICD-10-CM | POA: Diagnosis not present

## 2017-01-04 DIAGNOSIS — Z6831 Body mass index (BMI) 31.0-31.9, adult: Secondary | ICD-10-CM | POA: Diagnosis not present

## 2017-01-04 DIAGNOSIS — T84018A Broken internal joint prosthesis, other site, initial encounter: Secondary | ICD-10-CM

## 2017-01-04 DIAGNOSIS — Z8249 Family history of ischemic heart disease and other diseases of the circulatory system: Secondary | ICD-10-CM

## 2017-01-04 DIAGNOSIS — M25561 Pain in right knee: Secondary | ICD-10-CM | POA: Diagnosis present

## 2017-01-04 DIAGNOSIS — M1712 Unilateral primary osteoarthritis, left knee: Secondary | ICD-10-CM | POA: Diagnosis present

## 2017-01-04 HISTORY — PX: TOTAL KNEE ARTHROPLASTY WITH REVISION COMPONENTS: SHX6198

## 2017-01-04 LAB — TYPE AND SCREEN
ABO/RH(D): O POS
ANTIBODY SCREEN: NEGATIVE

## 2017-01-04 SURGERY — TOTAL KNEE ARTHROPLASTY WITH REVISION COMPONENTS
Anesthesia: Monitor Anesthesia Care | Site: Knee | Laterality: Right

## 2017-01-04 MED ORDER — GABAPENTIN 300 MG PO CAPS
ORAL_CAPSULE | ORAL | Status: AC
  Administered 2017-01-04: 300 mg via ORAL
  Filled 2017-01-04: qty 1

## 2017-01-04 MED ORDER — CHLORHEXIDINE GLUCONATE 4 % EX LIQD: 60.0000 mL | Freq: Once | CUTANEOUS | Status: DC

## 2017-01-04 MED ORDER — ONDANSETRON HCL 4 MG/2ML IJ SOLN
INTRAMUSCULAR | Status: AC
  Filled 2017-01-04: qty 2

## 2017-01-04 MED ORDER — MONTELUKAST SODIUM 10 MG PO TABS
10.0000 mg | ORAL_TABLET | Freq: Every day | ORAL | Status: DC
  Administered 2017-01-04 – 2017-01-05 (×2): 10 mg via ORAL
  Filled 2017-01-04 (×2): qty 1

## 2017-01-04 MED ORDER — HYDROMORPHONE HCL-NACL 0.5-0.9 MG/ML-% IV SOSY
0.2500 mg | PREFILLED_SYRINGE | INTRAVENOUS | Status: DC | PRN
  Administered 2017-01-04 (×2): 0.25 mg via INTRAVENOUS

## 2017-01-04 MED ORDER — STERILE WATER FOR IRRIGATION IR SOLN
Status: DC | PRN
  Administered 2017-01-04: 3000 mL

## 2017-01-04 MED ORDER — ONDANSETRON HCL 4 MG/2ML IJ SOLN: 4.0000 mg | Freq: Four times a day (QID) | INTRAMUSCULAR | Status: DC | PRN

## 2017-01-04 MED ORDER — LIP MEDEX EX OINT
TOPICAL_OINTMENT | CUTANEOUS | Status: AC
  Administered 2017-01-04: 22:00:00
  Filled 2017-01-04: qty 7

## 2017-01-04 MED ORDER — DEXAMETHASONE SODIUM PHOSPHATE 10 MG/ML IJ SOLN
10.0000 mg | Freq: Once | INTRAMUSCULAR | Status: AC
  Administered 2017-01-05: 10 mg via INTRAVENOUS
  Filled 2017-01-04: qty 1

## 2017-01-04 MED ORDER — ALBUTEROL SULFATE (2.5 MG/3ML) 0.083% IN NEBU: 2.5000 mg | INHALATION_SOLUTION | Freq: Four times a day (QID) | RESPIRATORY_TRACT | Status: DC | PRN

## 2017-01-04 MED ORDER — GABAPENTIN 300 MG PO CAPS
300.0000 mg | ORAL_CAPSULE | Freq: Once | ORAL | Status: AC
  Administered 2017-01-04: 300 mg via ORAL

## 2017-01-04 MED ORDER — PROPOFOL 10 MG/ML IV BOLUS
INTRAVENOUS | Status: AC
  Filled 2017-01-04: qty 20

## 2017-01-04 MED ORDER — CEFAZOLIN SODIUM-DEXTROSE 2-4 GM/100ML-% IV SOLN
INTRAVENOUS | Status: AC
  Filled 2017-01-04: qty 100

## 2017-01-04 MED ORDER — HYDROMORPHONE HCL-NACL 0.5-0.9 MG/ML-% IV SOSY
PREFILLED_SYRINGE | INTRAVENOUS | Status: AC
  Filled 2017-01-04: qty 2

## 2017-01-04 MED ORDER — ACETAMINOPHEN 500 MG PO TABS
1000.0000 mg | ORAL_TABLET | Freq: Four times a day (QID) | ORAL | Status: AC
  Administered 2017-01-04 – 2017-01-05 (×4): 1000 mg via ORAL
  Filled 2017-01-04 (×4): qty 2

## 2017-01-04 MED ORDER — ACETAMINOPHEN 10 MG/ML IV SOLN
1000.0000 mg | Freq: Once | INTRAVENOUS | Status: AC
  Administered 2017-01-04: 1000 mg via INTRAVENOUS

## 2017-01-04 MED ORDER — OXYCODONE HCL 5 MG PO TABS
5.0000 mg | ORAL_TABLET | ORAL | Status: DC | PRN
  Administered 2017-01-04: 22:00:00 10 mg via ORAL
  Administered 2017-01-04: 5 mg via ORAL
  Administered 2017-01-04: 10 mg via ORAL
  Administered 2017-01-04: 5 mg via ORAL
  Administered 2017-01-05 – 2017-01-06 (×7): 10 mg via ORAL
  Filled 2017-01-04 (×3): qty 2
  Filled 2017-01-04: qty 1
  Filled 2017-01-04 (×2): qty 2
  Filled 2017-01-04: qty 1
  Filled 2017-01-04 (×5): qty 2

## 2017-01-04 MED ORDER — PROPOFOL 10 MG/ML IV BOLUS
INTRAVENOUS | Status: AC
  Filled 2017-01-04: qty 40

## 2017-01-04 MED ORDER — BUPIVACAINE LIPOSOME 1.3 % IJ SUSP
20.0000 mL | Freq: Once | INTRAMUSCULAR | Status: DC
  Filled 2017-01-04: qty 20

## 2017-01-04 MED ORDER — MIDAZOLAM HCL 2 MG/2ML IJ SOLN
INTRAMUSCULAR | Status: AC
  Filled 2017-01-04: qty 2

## 2017-01-04 MED ORDER — OXYCODONE HCL 5 MG PO TABS: 5.0000 mg | ORAL_TABLET | Freq: Once | ORAL | Status: DC | PRN

## 2017-01-04 MED ORDER — PROPOFOL 500 MG/50ML IV EMUL
INTRAVENOUS | Status: DC | PRN
  Administered 2017-01-04: 50 ug/kg/min via INTRAVENOUS

## 2017-01-04 MED ORDER — FENTANYL CITRATE (PF) 100 MCG/2ML IJ SOLN
100.0000 ug | Freq: Once | INTRAMUSCULAR | Status: AC
  Administered 2017-01-04 (×2): 50 ug via INTRAVENOUS

## 2017-01-04 MED ORDER — ACETAMINOPHEN 325 MG PO TABS: 650.0000 mg | ORAL_TABLET | Freq: Four times a day (QID) | ORAL | Status: DC | PRN

## 2017-01-04 MED ORDER — MIDAZOLAM HCL 2 MG/2ML IJ SOLN
2.0000 mg | Freq: Once | INTRAMUSCULAR | Status: AC
  Administered 2017-01-04 (×2): 1 mg via INTRAVENOUS

## 2017-01-04 MED ORDER — CARVEDILOL 6.25 MG PO TABS
6.2500 mg | ORAL_TABLET | Freq: Two times a day (BID) | ORAL | Status: DC
  Administered 2017-01-04 – 2017-01-06 (×3): 6.25 mg via ORAL
  Filled 2017-01-04 (×4): qty 1

## 2017-01-04 MED ORDER — ACETAMINOPHEN 650 MG RE SUPP: 650.0000 mg | Freq: Four times a day (QID) | RECTAL | Status: DC | PRN

## 2017-01-04 MED ORDER — ACETAMINOPHEN 10 MG/ML IV SOLN
INTRAVENOUS | Status: AC
  Filled 2017-01-04: qty 100

## 2017-01-04 MED ORDER — DIPHENHYDRAMINE HCL 12.5 MG/5ML PO ELIX: 12.5000 mg | ORAL_SOLUTION | ORAL | Status: DC | PRN

## 2017-01-04 MED ORDER — TRAMADOL HCL 50 MG PO TABS: 50.0000 mg | ORAL_TABLET | Freq: Four times a day (QID) | ORAL | Status: DC | PRN

## 2017-01-04 MED ORDER — SODIUM CHLORIDE 0.9 % IR SOLN
Status: DC | PRN
  Administered 2017-01-04: 1000 mL

## 2017-01-04 MED ORDER — MOMETASONE FURO-FORMOTEROL FUM 100-5 MCG/ACT IN AERO
2.0000 | INHALATION_SPRAY | Freq: Two times a day (BID) | RESPIRATORY_TRACT | Status: DC
  Administered 2017-01-05 – 2017-01-06 (×3): 2 via RESPIRATORY_TRACT
  Filled 2017-01-04: qty 8.8

## 2017-01-04 MED ORDER — FLEET ENEMA 7-19 GM/118ML RE ENEM
1.0000 | ENEMA | Freq: Once | RECTAL | Status: DC | PRN
Start: 2017-01-04 — End: 2017-01-06

## 2017-01-04 MED ORDER — PROPOFOL 10 MG/ML IV BOLUS
INTRAVENOUS | Status: DC | PRN
  Administered 2017-01-04: 30 mg via INTRAVENOUS

## 2017-01-04 MED ORDER — OXYCODONE HCL 5 MG/5ML PO SOLN: 5.0000 mg | Freq: Once | ORAL | Status: DC | PRN

## 2017-01-04 MED ORDER — FENTANYL CITRATE (PF) 100 MCG/2ML IJ SOLN
INTRAMUSCULAR | Status: AC
  Administered 2017-01-04: 50 ug via INTRAVENOUS
  Filled 2017-01-04: qty 2

## 2017-01-04 MED ORDER — METOCLOPRAMIDE HCL 5 MG/ML IJ SOLN: 5.0000 mg | Freq: Three times a day (TID) | INTRAMUSCULAR | Status: DC | PRN

## 2017-01-04 MED ORDER — ONDANSETRON HCL 4 MG/2ML IJ SOLN
INTRAMUSCULAR | Status: DC | PRN
  Administered 2017-01-04: 4 mg via INTRAVENOUS

## 2017-01-04 MED ORDER — ROPIVACAINE HCL 7.5 MG/ML IJ SOLN
INTRAMUSCULAR | Status: DC | PRN
Start: 2017-01-04 — End: 2017-01-04
  Administered 2017-01-04: 20 mL via PERINEURAL

## 2017-01-04 MED ORDER — BUPIVACAINE LIPOSOME 1.3 % IJ SUSP
INTRAMUSCULAR | Status: DC | PRN
  Administered 2017-01-04: 20 mL

## 2017-01-04 MED ORDER — BUPIVACAINE IN DEXTROSE 0.75-8.25 % IT SOLN
INTRATHECAL | Status: DC | PRN
  Administered 2017-01-04: 2 mL via INTRATHECAL

## 2017-01-04 MED ORDER — ALBUTEROL SULFATE HFA 108 (90 BASE) MCG/ACT IN AERS
INHALATION_SPRAY | RESPIRATORY_TRACT | Status: AC
  Administered 2017-01-04: 15:00:00
  Filled 2017-01-04: qty 6.7

## 2017-01-04 MED ORDER — DEXAMETHASONE SODIUM PHOSPHATE 10 MG/ML IJ SOLN
INTRAMUSCULAR | Status: AC
  Filled 2017-01-04: qty 1

## 2017-01-04 MED ORDER — BISACODYL 10 MG RE SUPP: 10.0000 mg | Freq: Every day | RECTAL | Status: DC | PRN

## 2017-01-04 MED ORDER — LOSARTAN POTASSIUM 50 MG PO TABS
100.0000 mg | ORAL_TABLET | Freq: Every day | ORAL | Status: DC
  Administered 2017-01-06: 100 mg via ORAL
  Filled 2017-01-04: qty 2

## 2017-01-04 MED ORDER — ACETAMINOPHEN 325 MG PO TABS: 325.0000 mg | ORAL_TABLET | ORAL | Status: DC | PRN

## 2017-01-04 MED ORDER — TRANEXAMIC ACID 1000 MG/10ML IV SOLN
1000.0000 mg | Freq: Once | INTRAVENOUS | Status: AC
  Administered 2017-01-04: 1000 mg via INTRAVENOUS
  Filled 2017-01-04: qty 1100

## 2017-01-04 MED ORDER — DEXAMETHASONE SODIUM PHOSPHATE 10 MG/ML IJ SOLN
10.0000 mg | Freq: Once | INTRAMUSCULAR | Status: AC
  Administered 2017-01-04: 10 mg via INTRAVENOUS

## 2017-01-04 MED ORDER — TRANEXAMIC ACID 1000 MG/10ML IV SOLN
1000.0000 mg | INTRAVENOUS | Status: AC
  Administered 2017-01-04: 1000 mg via INTRAVENOUS
  Filled 2017-01-04: qty 1100

## 2017-01-04 MED ORDER — CEFAZOLIN SODIUM-DEXTROSE 2-4 GM/100ML-% IV SOLN
2.0000 g | INTRAVENOUS | Status: AC
  Administered 2017-01-04: 2 g via INTRAVENOUS

## 2017-01-04 MED ORDER — ACETAMINOPHEN 160 MG/5ML PO SOLN: 325.0000 mg | ORAL | Status: DC | PRN

## 2017-01-04 MED ORDER — METHOCARBAMOL 500 MG PO TABS
500.0000 mg | ORAL_TABLET | Freq: Four times a day (QID) | ORAL | Status: DC | PRN
  Administered 2017-01-04 – 2017-01-06 (×5): 500 mg via ORAL
  Filled 2017-01-04 (×6): qty 1

## 2017-01-04 MED ORDER — MIDAZOLAM HCL 2 MG/2ML IJ SOLN
INTRAMUSCULAR | Status: AC
  Administered 2017-01-04: 1 mg via INTRAVENOUS
  Filled 2017-01-04: qty 2

## 2017-01-04 MED ORDER — POLYETHYLENE GLYCOL 3350 17 G PO PACK: 17.0000 g | PACK | Freq: Every day | ORAL | Status: DC | PRN

## 2017-01-04 MED ORDER — FENTANYL CITRATE (PF) 100 MCG/2ML IJ SOLN
INTRAMUSCULAR | Status: AC
  Filled 2017-01-04: qty 2

## 2017-01-04 MED ORDER — SODIUM CHLORIDE 0.9 % IJ SOLN
INTRAMUSCULAR | Status: DC | PRN
Start: 2017-01-04 — End: 2017-01-04
  Administered 2017-01-04: 50 mL

## 2017-01-04 MED ORDER — MENTHOL 3 MG MT LOZG: 1.0000 | LOZENGE | OROMUCOSAL | Status: DC | PRN

## 2017-01-04 MED ORDER — LACTATED RINGERS IV SOLN
INTRAVENOUS | Status: DC
  Administered 2017-01-04 (×3): via INTRAVENOUS

## 2017-01-04 MED ORDER — ONDANSETRON HCL 4 MG PO TABS: 4.0000 mg | ORAL_TABLET | Freq: Four times a day (QID) | ORAL | Status: DC | PRN

## 2017-01-04 MED ORDER — RIVAROXABAN 10 MG PO TABS
10.0000 mg | ORAL_TABLET | Freq: Every day | ORAL | Status: DC
  Administered 2017-01-05 – 2017-01-06 (×2): 10 mg via ORAL
  Filled 2017-01-04 (×2): qty 1

## 2017-01-04 MED ORDER — HYDROCHLOROTHIAZIDE 25 MG PO TABS
25.0000 mg | ORAL_TABLET | Freq: Every day | ORAL | Status: DC
  Administered 2017-01-05 – 2017-01-06 (×2): 25 mg via ORAL
  Filled 2017-01-04 (×2): qty 1

## 2017-01-04 MED ORDER — PHENOL 1.4 % MT LIQD: 1.0000 | OROMUCOSAL | Status: DC | PRN

## 2017-01-04 MED ORDER — SODIUM CHLORIDE 0.9 % IV SOLN
INTRAVENOUS | Status: DC
  Administered 2017-01-04 (×2): via INTRAVENOUS

## 2017-01-04 MED ORDER — METOCLOPRAMIDE HCL 5 MG PO TABS: 5.0000 mg | ORAL_TABLET | Freq: Three times a day (TID) | ORAL | Status: DC | PRN

## 2017-01-04 MED ORDER — ALBUTEROL SULFATE HFA 108 (90 BASE) MCG/ACT IN AERS
2.0000 | INHALATION_SPRAY | RESPIRATORY_TRACT | Status: AC | PRN
  Administered 2017-01-04: 2 via RESPIRATORY_TRACT

## 2017-01-04 MED ORDER — METHOCARBAMOL 1000 MG/10ML IJ SOLN
500.0000 mg | Freq: Four times a day (QID) | INTRAVENOUS | Status: DC | PRN
  Administered 2017-01-04: 500 mg via INTRAVENOUS
  Filled 2017-01-04: qty 5
  Filled 2017-01-04: qty 550

## 2017-01-04 MED ORDER — MORPHINE SULFATE (PF) 4 MG/ML IV SOLN: 1.0000 mg | INTRAVENOUS | Status: DC | PRN

## 2017-01-04 MED ORDER — CEFAZOLIN SODIUM-DEXTROSE 2-4 GM/100ML-% IV SOLN
2.0000 g | Freq: Four times a day (QID) | INTRAVENOUS | Status: AC
  Administered 2017-01-04 (×2): 2 g via INTRAVENOUS
  Filled 2017-01-04 (×2): qty 100

## 2017-01-04 MED ORDER — PHENYLEPHRINE HCL 10 MG/ML IJ SOLN
INTRAMUSCULAR | Status: DC | PRN
  Administered 2017-01-04: 80 ug via INTRAVENOUS
  Administered 2017-01-04 (×2): 40 ug via INTRAVENOUS
  Administered 2017-01-04: 80 ug via INTRAVENOUS
  Administered 2017-01-04: 40 ug via INTRAVENOUS
  Administered 2017-01-04: 80 ug via INTRAVENOUS

## 2017-01-04 MED ORDER — SODIUM CHLORIDE 0.9 % IJ SOLN
INTRAMUSCULAR | Status: AC
  Filled 2017-01-04: qty 50

## 2017-01-04 MED ORDER — PHENYLEPHRINE 40 MCG/ML (10ML) SYRINGE FOR IV PUSH (FOR BLOOD PRESSURE SUPPORT)
PREFILLED_SYRINGE | INTRAVENOUS | Status: AC
  Filled 2017-01-04: qty 10

## 2017-01-04 MED ORDER — DOCUSATE SODIUM 100 MG PO CAPS
100.0000 mg | ORAL_CAPSULE | Freq: Two times a day (BID) | ORAL | Status: DC
  Administered 2017-01-04 – 2017-01-06 (×4): 100 mg via ORAL
  Filled 2017-01-04 (×4): qty 1

## 2017-01-04 MED ORDER — SODIUM CHLORIDE 0.9 % IJ SOLN
INTRAMUSCULAR | Status: AC
  Filled 2017-01-04: qty 10

## 2017-01-04 SURGICAL SUPPLY — 63 items
ADAPTER BOLT FEMORAL +2/-2 (Knees) ×3 IMPLANT
AUG FEM DIST PFC 4 8 RT (Knees) ×2 IMPLANT
AUGMENT FEM DIST PFC 4 8 RT (Knees) ×2 IMPLANT
AUGMENT POSTERIOR PFC SZ4 4MM (Knees) ×2 IMPLANT
BAG ZIPLOCK 12X15 (MISCELLANEOUS) IMPLANT
BANDAGE ACE 6X5 VEL STRL LF (GAUZE/BANDAGES/DRESSINGS) ×3 IMPLANT
BLADE SAG 18X100X1.27 (BLADE) ×3 IMPLANT
BLADE SAW SGTL 11.0X1.19X90.0M (BLADE) ×3 IMPLANT
BONE CEMENT GENTAMICIN (Cement) ×9 IMPLANT
CEMENT BONE GENTAMICIN 40 (Cement) ×3 IMPLANT
CEMENT RESTRICTOR DEPUY SZ 4 (Cement) ×3 IMPLANT
CLOSURE WOUND 1/2 X4 (GAUZE/BANDAGES/DRESSINGS) ×1
COMP FEM CEM RT SZ4 (Orthopedic Implant) ×3 IMPLANT
COMPONENT FEM CEM RT SZ4 (Orthopedic Implant) ×1 IMPLANT
COVER SURGICAL LIGHT HANDLE (MISCELLANEOUS) ×3 IMPLANT
CUFF TOURN SGL QUICK 34 (TOURNIQUET CUFF) ×2
CUFF TRNQT CYL 34X4X40X1 (TOURNIQUET CUFF) ×1 IMPLANT
DISTAL AUGMENT (Knees) ×4 IMPLANT
DRAPE U-SHAPE 47X51 STRL (DRAPES) ×3 IMPLANT
DRSG ADAPTIC 3X8 NADH LF (GAUZE/BANDAGES/DRESSINGS) ×3 IMPLANT
DRSG PAD ABDOMINAL 8X10 ST (GAUZE/BANDAGES/DRESSINGS) ×3 IMPLANT
DURAPREP 26ML APPLICATOR (WOUND CARE) ×3 IMPLANT
ELECT REM PT RETURN 15FT ADLT (MISCELLANEOUS) ×3 IMPLANT
EVACUATOR 1/8 PVC DRAIN (DRAIN) ×3 IMPLANT
FEMORAL ADAPTER (Orthopedic Implant) ×3 IMPLANT
GAUZE SPONGE 4X4 12PLY STRL (GAUZE/BANDAGES/DRESSINGS) ×3 IMPLANT
GLOVE BIO SURGEON STRL SZ7.5 (GLOVE) ×6 IMPLANT
GLOVE BIO SURGEON STRL SZ8 (GLOVE) ×6 IMPLANT
GLOVE BIOGEL PI IND STRL 8 (GLOVE) ×4 IMPLANT
GLOVE BIOGEL PI INDICATOR 8 (GLOVE) ×8
GLOVE SURG SS PI 6.5 STRL IVOR (GLOVE) IMPLANT
GOWN STRL REUS W/TWL LRG LVL3 (GOWN DISPOSABLE) ×6 IMPLANT
GOWN STRL REUS W/TWL XL LVL3 (GOWN DISPOSABLE) ×9 IMPLANT
HANDPIECE INTERPULSE COAX TIP (DISPOSABLE) ×2
IMMOBILIZER KNEE 20 (SOFTGOODS) ×3
IMMOBILIZER KNEE 20 THIGH 36 (SOFTGOODS) ×1 IMPLANT
INSERT TC3 TIBIAL SZ4 20.0 (Knees) ×3 IMPLANT
MANIFOLD NEPTUNE II (INSTRUMENTS) ×3 IMPLANT
NS IRRIG 1000ML POUR BTL (IV SOLUTION) ×3 IMPLANT
PACK TOTAL KNEE CUSTOM (KITS) ×3 IMPLANT
PAD ABD 8X10 STRL (GAUZE/BANDAGES/DRESSINGS) ×3 IMPLANT
PADDING CAST COTTON 6X4 STRL (CAST SUPPLIES) ×3 IMPLANT
POSITIONER SURGICAL ARM (MISCELLANEOUS) ×3 IMPLANT
POSTERIOR AUGMENT PFC SZ4 4MM (Knees) ×6 IMPLANT
SET HNDPC FAN SPRY TIP SCT (DISPOSABLE) ×1 IMPLANT
STEM FLUTED UNIV REV 75X20 (Stem) ×3 IMPLANT
STEM TIBIA PFC 13X60MM (Stem) ×3 IMPLANT
STRIP CLOSURE SKIN 1/2X4 (GAUZE/BANDAGES/DRESSINGS) ×2 IMPLANT
SUT STRATAFIX 0 PDS 27 VIOLET (SUTURE) ×3
SUT VIC AB 2-0 CT1 27 (SUTURE) ×6
SUT VIC AB 2-0 CT1 TAPERPNT 27 (SUTURE) ×3 IMPLANT
SUTURE STRATFX 0 PDS 27 VIOLET (SUTURE) ×1 IMPLANT
SWAB COLLECTION DEVICE MRSA (MISCELLANEOUS) IMPLANT
SYR 50ML LL SCALE MARK (SYRINGE) ×6 IMPLANT
TOWER CARTRIDGE SMART MIX (DISPOSABLE) ×3 IMPLANT
TRAY FOLEY CATH 14FR (SET/KITS/TRAYS/PACK) ×3 IMPLANT
TRAY FOLEY W/METER SILVER 16FR (SET/KITS/TRAYS/PACK) IMPLANT
TRAY REVISION SZ 4 (Knees) ×3 IMPLANT
TRAY SLEEVE CEM ML (Knees) ×3 IMPLANT
TUBE KAMVAC SUCTION (TUBING) IMPLANT
WATER STERILE IRR 1500ML POUR (IV SOLUTION) ×6 IMPLANT
WEDGE SIZE 4 10MM (Knees) ×6 IMPLANT
WRAP KNEE MAXI GEL POST OP (GAUZE/BANDAGES/DRESSINGS) ×3 IMPLANT

## 2017-01-04 NOTE — Anesthesia Procedure Notes (Signed)
Anesthesia Regional Block: Adductor canal block   Pre-Anesthetic Checklist: ,, timeout performed, Correct Patient, Correct Site, Correct Laterality, Correct Procedure, Correct Position, site marked, Risks and benefits discussed,  Surgical consent,  Pre-op evaluation,  At surgeon's request and post-op pain management  Laterality: Lower and Right  Prep: chloraprep       Needles:  Injection technique: Single-shot  Needle Type: Echogenic Stimulator Needle          Additional Needles:   Procedures:,,,, ultrasound used (permanent image in chart),,,,  Narrative:  Start time: 01/04/2017 9:27 AM End time: 01/04/2017 9:33 AM Injection made incrementally with aspirations every 5 mL.  Performed by: Personally  Anesthesiologist: Aishah Teffeteller  Additional Notes: H+P and labs reviewed, risks and benefits discussed with patient, procedure tolerated well without complications

## 2017-01-04 NOTE — Anesthesia Preprocedure Evaluation (Signed)
Anesthesia Evaluation  Patient identified by MRN, date of birth, ID band Patient awake    Reviewed: Allergy & Precautions, NPO status , Patient's Chart, lab work & pertinent test results, reviewed documented beta blocker date and time   History of Anesthesia Complications Negative for: history of anesthetic complications  Airway Mallampati: II  TM Distance: >3 FB Neck ROM: Full    Dental  (+) Partial Upper   Pulmonary neg shortness of breath, asthma , neg sleep apnea, neg recent URI, former smoker,    breath sounds clear to auscultation       Cardiovascular hypertension, Pt. on medications and Pt. on home beta blockers + angina (-) Past MI and (-) CHF  Rhythm:Regular     Neuro/Psych negative neurological ROS  negative psych ROS   GI/Hepatic negative GI ROS, Neg liver ROS,   Endo/Other  Morbid obesity  Renal/GU negative Renal ROS     Musculoskeletal  (+) Arthritis ,   Abdominal   Peds  Hematology negative hematology ROS (+)   Anesthesia Other Findings   Reproductive/Obstetrics                             Anesthesia Physical Anesthesia Plan  ASA: II  Anesthesia Plan: MAC, Regional and Spinal   Post-op Pain Management:    Induction:   PONV Risk Score and Plan: 2 and Ondansetron and Dexamethasone  Airway Management Planned: Nasal Cannula  Additional Equipment: None  Intra-op Plan:   Post-operative Plan:   Informed Consent: I have reviewed the patients History and Physical, chart, labs and discussed the procedure including the risks, benefits and alternatives for the proposed anesthesia with the patient or authorized representative who has indicated his/her understanding and acceptance.   Dental advisory given  Plan Discussed with: CRNA and Surgeon  Anesthesia Plan Comments:         Anesthesia Quick Evaluation

## 2017-01-04 NOTE — Addendum Note (Signed)
Addendum  created 01/04/17 1417 by Wynonia Sours, CRNA   Anesthesia Intra Flowsheets edited

## 2017-01-04 NOTE — Anesthesia Postprocedure Evaluation (Signed)
Anesthesia Post Note  Patient: Tamura Lasky  Procedure(s) Performed: Right knee polyethylene versus total knee arthroplasty revision (Right Knee)     Patient location during evaluation: PACU Anesthesia Type: Regional, MAC and Spinal Level of consciousness: awake and alert Pain management: pain level controlled Vital Signs Assessment: post-procedure vital signs reviewed and stable Respiratory status: spontaneous breathing, nonlabored ventilation, respiratory function stable and patient connected to nasal cannula oxygen Cardiovascular status: stable and blood pressure returned to baseline Postop Assessment: no apparent nausea or vomiting and spinal receding Anesthetic complications: no    Last Vitals:  Vitals:   01/04/17 0936 01/04/17 0937  BP: (!) 141/93   Pulse: 75 62  Resp: 18 12  Temp:    SpO2: 100% 99%    Last Pain:  Vitals:   01/04/17 0817  TempSrc: Oral                 Kelby Lotspeich

## 2017-01-04 NOTE — Op Note (Signed)
NAMEYAMILA, Gabriella Nguyen              ACCOUNT NO.:  [id number]  MEDICAL RECORD NO.:  [id number]  LOCATION:                                 FACILITY:  PHYSICIAN:  Ollen Gross, M.D.         DATE OF BIRTH:  DATE OF PROCEDURE:  01/04/2017 DATE OF DISCHARGE:                              OPERATIVE REPORT   PREOPERATIVE DIAGNOSIS:  Unstable right total knee arthroplasty.  POSTOP DIAGNOSIS:  Unstable right total knee arthroplasty.  PROCEDURE:  Left total knee arthroplasty revision.  SURGEON:  Ollen Gross, M.D.  ASSISTANT:  Alexzandrew L. Perkins, PA-C.  ANESTHESIA:  Spinal with adductor canal block.  ESTIMATED BLOOD LOSS:  Minimal.  DRAINS:  Hemovac x1.  TOURNIQUET TIME:  Up 55 minutes at 300 am mmHg, down 8 minutes, and then up additional 17 minutes at 300 mmHg.  COMPLICATIONS:  None.  CONDITION:  Stable to recovery.  BRIEF CLINICAL NOTE:  Gabriella Nguyen is a 61 year old female, who had a total knee arthroplasty done at the [facility] a couple years ago.  She has had significant pain and dysfunction with gross instability on exam.  Due to her pain and instability, she presents now for polyethylene versus total knee revision.  PROCEDURE IN DETAIL:  After successful administration of spinal anesthetic, an exam under anesthesia was performed and she hyperextended the right knee about 20 degrees and has gross varus, valgus, and AP laxity throughout full range of motion.  A tourniquet was then placed high on her right thigh and the right lower extremity prepped and draped in the usual sterile fashion.  The extremity was wrapped in Esmarch and the tourniquet inflated to 300 mmHg.  The midline incision was made with a 10 blade through subcutaneous tissue to the level of the extensor mechanism.  A fresh blade was used to make a medial parapatellar arthrotomy.  We did not encounter any fluid in the joint.  Soft tissue over the proximal medial tibia was subperiosteally elevated to the  joint line with a knife and into the semimembranosus bursa with a Cobb elevator.  Soft tissue laterally was elevated with attention being paid to avoiding the patellar tendon on the tibial tubercle.  I was able to evert the patella.  Knee was flexed 90 degrees and the polyethylene was removed and the tibial component.  It was decided that due to the global laxity, there was no way we were going to be able to fix this with just a thicker polyethylene, thus she was going to need a total knee arthroplasty revision.  We subsequently disrupted the interface between the tibial component and bone using osteotomes and a saw.  I was able to extract the stemmed tibial component without difficulty.  The cement mantle was removed.  The canal was then reamed up to 13 mm for placement of a 13 mm cemented stem.  The size 4 was the most appropriate size for the tibia.  We then placed extramedullary tibial alignment guide, referencing proximally at the medial aspect of tibial tubercle and distally along the second metatarsal axis and tibial crest to remove about 2 mm off the cut bone surface to allow for a flat  surface for placement of the implant.  Size 4 was most appropriate.  We then prepared proximally with the reamer and then proximally and distally with the reamer and the 13 x 60 stem extension.  I then prepared for a 29-mm sleeve by broaching for that.  The femur was then addressed.  The femoral component was removed by disrupting interface between the component and bone using osteotomes. It was removed with minimal to no bone loss.  I then drilled into the distal femur and thoroughly irrigated the canal with saline.  I reamed up to 20 mm to get a good press fit for the stem.  That Reamer was left in place to serve as our intramedullary cutting guide.  Distal femoral cutting block was placed and block was pinched to remove 2 mm off the distal femur.  Given that she hyperextends so much to the side  that I would need to build a joint line down and place 8 mm augments medial and lateral onto the cutting block for the AP cut.  Size 4 was most appropriate for the femur also.  Rotation was marked off the epicondylar axis and confirmed by creating a rectangular flexion gap at 90 degrees. The block was pinned in this position.  Anteriorly and with the chamfers, we did not get any bone.  Posteriorly, I was able to cut through the 4 mm slot in and get bone, thus we needed 4 mm augments posteriorly both medial and lateral.  The intercondylar block was then placed and the intercondylar cut made.  Trial components were placed.  On tibial side, it was a size 4 MBT revision tray with 10 mm augments medial and lateral, 29 mm sleeve and a 13 x 60 stem extension.  Note that, I had put a cement restrictor down into the tibial canal before doing this.  On the femoral side, it was a size 4 TC3 femur with an 8 mm distal augment medial and lateral and 4 mm posterior augment medial and lateral.  The stem was a 20 x 75 in the +2 position in 5 degrees of valgus.  The trials were placed with excellent fit.  A 17.5 insert was utilized and full extension was achieved with excellent varus, valgus, and anterior-posterior balance throughout full range of motion.  The patella was everted and the components in good position.  I performed a patelloplasty to removal all of the scar and excess soft tissue around the components to assess the tracking and it tracked normally.  We then released the tourniquet and kept it down for 8 minutes.  Minor bleeding was stopped with electrocautery.  During this time, the components were assembled on the back table.  I also did a synovectomy and placed a 20 mL of Exparel with 60 mL of saline throughout the extensor mechanism periosteum of the femur, posterior tissues, and subcu tissue.  After 8 minutes, the leg was rewrapped in Esmarch and tourniquet reinflated to 300 mmHg.  All  the components were assembled on the back table.  We then removed the trials and thoroughly irrigated the bone with pulsatile lavage using saline.  The cement was mixed 3 batches of gentamicin impregnated cement.  Once ready for implantation, it was injected into the tibial canal and on the surface of the tibia.  Tibial components cemented in place and all extruded cement removed.  On the femoral side we cemented distally with a press-fit stem.  It was impacted and all extruded cement removed.  A 17.5 trial insert was placed and the cement that was squeezed out was removed.  There was a tiny bit of AP plate with a 75, so went with 20 trial and full extension was achieved with excellent varus, valgus, and anterior-posterior balance throughout full range of motion.  Hyperextension no longer existed.  She went to full extension and not beyond that.  Once cement was fully hardened, the permanent 20 mm TC3 rotating platform insert for the size 4 was placed.  The knee was reduced with outstanding stability. The patella tracks normally.  Wound was copiously irrigated with saline solution and the arthrotomy closed over Hemovac drain with a running #1 STRATAFIX suture.  Flexion against gravity 125 degrees and the patella tracks normally.  Tourniquet was released for the second tourniquet time of 17 minutes.  Subcu was then closed with interrupted 2-0 Vicryl and subcuticular running 4-0 Monocryl.  Incision was cleaned and dried, and Steri-Strips and a bulky sterile dressing applied.  She was then awakened and transported to recovery in stable condition.  Note that, a surgical assistant was a medical necessity for this procedure to do it in a safe and expeditious manner.  Surgical assistant was necessary for retraction of vital ligaments and neurovascular structures and also for safe and accurate placement of the limb for removal of the old prosthesis and for safe and accurate placement of the new  prosthesis.     Ollen Gross, M.D.     FA/MEDQ  D:  01/04/2017  T:  01/04/2017  Job:  [id number]

## 2017-01-04 NOTE — Anesthesia Procedure Notes (Signed)
Procedure Name: MAC Date/Time: 01/04/2017 9:43 AM Performed by: West Pugh Pre-anesthesia Checklist: Patient identified, Emergency Drugs available, Suction available and Patient being monitored Patient Re-evaluated:Patient Re-evaluated prior to induction Oxygen Delivery Method: Nasal cannula and Simple face mask Placement Confirmation: positive ETCO2 Dental Injury: Teeth and Oropharynx as per pre-operative assessment

## 2017-01-04 NOTE — Transfer of Care (Signed)
Immediate Anesthesia Transfer of Care Note  [name]  Procedure(s) Performed: Procedure(s): Right knee polyethylene versus total knee arthroplasty revision (Right)  Patient Location: PACU  Anesthesia Type:Spinal and MAC combined with regional for post-op pain  Level of Consciousness:  sedated, patient cooperative and responds to stimulation  Airway & Oxygen Therapy:Patient Spontanous Breathing and Patient connected to face mask oxgen  Post-op Assessment:  Report given to PACU RN and Post -op Vital signs reviewed and stable  Post vital signs:  Reviewed and stable  Last Vitals:  Vitals:   01/04/17 0936 01/04/17 0937  BP: (!) 141/93   Pulse: 75 62  Resp: 18 12  Temp:    SpO2: 025% 85%    Complications: No apparent anesthesia complications

## 2017-01-04 NOTE — Progress Notes (Signed)
AssistedDr. Moser with right, ultrasound guided, adductor canal block. Side rails up, monitors on throughout procedure. See vital signs in flow sheet. Tolerated Procedure well.  

## 2017-01-04 NOTE — Brief Op Note (Signed)
01/04/2017  11:39 AM  PATIENT:  Gabriella Nguyen  61 y.o. female  PRE-OPERATIVE DIAGNOSIS:  Unstable Right total knee arthroplasty   POST-OPERATIVE DIAGNOSIS:  Unstable Right total knee arthroplasty   PROCEDURE:  RIGHT TOTAL KNEE ARTHROPLASTY REVISION  SURGEON:  Surgeon(s) and Role:    Ollen Gross, MD - Primary  PHYSICIAN ASSISTANT:   ASSISTANTS: Avel Peace, PA-C   ANESTHESIA:   Adductor canal block and spinal  EBL:  Total I/O In: 2000 [I.V.:2000] Out: 750 [Urine:600; Blood:150]  BLOOD ADMINISTERED:none  DRAINS: (Medium) Hemovact drain(s) in the right knee with  Suction Open   LOCAL MEDICATIONS USED:  OTHER Exparel  COUNTS:  YES  TOURNIQUET:   Total Tourniquet Time Documented: Thigh (Right) - 55 minutes Thigh (Right) - 16 minutes Total: Thigh (Right) - 71 minutes   DICTATION: .Other Dictation: Dictation Number [id number]  PLAN OF CARE: Admit to inpatient   PATIENT DISPOSITION:  PACU - hemodynamically stable.

## 2017-01-04 NOTE — Anesthesia Procedure Notes (Addendum)
Spinal  Patient location during procedure: OR Start time: 01/04/2017 9:41 AM End time: 01/04/2017 9:49 AM Reason for block: at surgeon's request Staffing Resident/CRNA: Christell Faith L Performed: resident/CRNA  Preanesthetic Checklist Completed: patient identified, site marked, surgical consent, pre-op evaluation, timeout performed, IV checked, risks and benefits discussed and monitors and equipment checked Spinal Block Patient position: sitting Prep: Betadine Patient monitoring: heart rate, continuous pulse ox and blood pressure Approach: midline Location: L3-4 Injection technique: single-shot Needle Needle type: Pencan  Needle gauge: 24 G Needle length: 9 cm Assessment Sensory level: T4 Additional Notes Expiration of kit checked and confirmed. Patient tolerated procedure well,without complications x 1 attempt with noted clear CSF. Loss of motor and sensory on exam post injection.

## 2017-01-04 NOTE — Interval H&P Note (Signed)
History and Physical Interval Note:  01/04/2017 8:33 AM  Gabriella Nguyen  has presented today for surgery, with the diagnosis of Unstable Right total knee arthroplasty   The various methods of treatment have been discussed with the patient and family. After consideration of risks, benefits and other options for treatment, the patient has consented to  Procedure(s): Right knee polyethylene versus total knee arthroplasty revision (Right) as a surgical intervention .  The patient's history has been reviewed, patient examined, no change in status, stable for surgery.  I have reviewed the patient's chart and labs.  Questions were answered to the patient's satisfaction.     Loanne Drilling

## 2017-01-04 NOTE — Discharge Instructions (Addendum)
°  ° °Dr. Frank Aluisio °Total Joint Specialist °[facility] °[address] °[place], [place] [address] °[phone] ° °TOTAL KNEE REPLACEMENT POSTOPERATIVE DIRECTIONS ° °Knee Rehabilitation, Guidelines Following Surgery  °Results after knee surgery are often greatly improved when you follow the exercise, range of motion and muscle strengthening exercises prescribed by your doctor. Safety measures are also important to protect the knee from further injury. Any time any of these exercises cause you to have increased pain or swelling in your knee joint, decrease the amount until you are comfortable again and slowly increase them. If you have problems or questions, call your caregiver or physical therapist for advice.  ° °HOME CARE INSTRUCTIONS  °Remove items at home which could result in a fall. This includes throw rugs or furniture in walking pathways.  °· ICE to the affected knee every three hours for 30 minutes at a time and then as needed for pain and swelling.  Continue to use ice on the knee for pain and swelling from surgery. You may notice swelling that will progress down to the foot and ankle.  This is normal after surgery.  Elevate the leg when you are not up walking on it.   °· Continue to use the breathing machine which will help keep your temperature down.  It is common for your temperature to cycle up and down following surgery, especially at night when you are not up moving around and exerting yourself.  The breathing machine keeps your lungs expanded and your temperature down. °· Do not place pillow under knee, focus on keeping the knee straight while resting ° °DIET °You may resume your previous home diet once your are discharged from the hospital. ° °DRESSING / WOUND CARE / SHOWERING °You may shower 3 days after surgery, but keep the wounds dry during showering.  You may use an occlusive plastic wrap (Press'n Seal for example), NO SOAKING/SUBMERGING IN THE BATHTUB.  If the  bandage gets wet, change with a clean dry gauze.  If the incision gets wet, pat the wound dry with a clean towel. °You may start showering once you are discharged home but do not submerge the incision under water. Just pat the incision dry and apply a dry gauze dressing on daily. °Change the surgical dressing daily and reapply a dry dressing each time. ° °ACTIVITY °Walk with your walker as instructed. °Use walker as long as suggested by your caregivers. °Avoid periods of inactivity such as sitting longer than an hour when not asleep. This helps prevent blood clots.  °You may resume a sexual relationship in one month or when given the OK by your doctor.  °You may return to work once you are cleared by your doctor.  °Do not drive a car for 6 weeks or until released by you surgeon.  °Do not drive while taking narcotics. ° °WEIGHT BEARING °Weight bearing as tolerated with assist device (walker, cane, etc) as directed, use it as long as suggested by your surgeon or therapist, typically at least 4-6 weeks. ° °POSTOPERATIVE CONSTIPATION PROTOCOL °Constipation - defined medically as fewer than three stools per week and severe constipation as less than one stool per week. ° °One of the most common issues patients have following surgery is constipation.  Even if you have a regular bowel pattern at home, your normal regimen is likely to be disrupted due to multiple reasons following surgery.  Combination of anesthesia, postoperative narcotics, change in appetite and fluid intake all can affect your   bowels.  In order to avoid complications following surgery, here are some recommendations in order to help you during your recovery period. ° °Colace (docusate) - Pick up an over-the-counter form of Colace or another stool softener and take twice a day as long as you are requiring postoperative pain medications.  Take with a full glass of water daily.  If you experience loose stools or diarrhea, hold the colace until you stool forms  back up.  If your symptoms do not get better within 1 week or if they get worse, check with your doctor. ° °Dulcolax (bisacodyl) - Pick up over-the-counter and take as directed by the product packaging as needed to assist with the movement of your bowels.  Take with a full glass of water.  Use this product as needed if not relieved by Colace only.  ° °MiraLax (polyethylene glycol) - Pick up over-the-counter to have on hand.  MiraLax is a solution that will increase the amount of water in your bowels to assist with bowel movements.  Take as directed and can mix with a glass of water, juice, soda, coffee, or tea.  Take if you go more than two days without a movement. °Do not use MiraLax more than once per day. Call your doctor if you are still constipated or irregular after using this medication for 7 days in a row. ° °If you continue to have problems with postoperative constipation, please contact the office for further assistance and recommendations.  If you experience "the worst abdominal pain ever" or develop nausea or vomiting, please contact the office immediatly for further recommendations for treatment. ° °ITCHING ° If you experience itching with your medications, try taking only a single pain pill, or even half a pain pill at a time.  You can also use Benadryl over the counter for itching or also to help with sleep.  ° °TED HOSE STOCKINGS °Wear the elastic stockings on both legs for three weeks following surgery during the day but you may remove then at night for sleeping. ° °MEDICATIONS °See your medication summary on the “After Visit Summary” that the nursing staff will review with you prior to discharge.  You may have some home medications which will be placed on hold until you complete the course of blood thinner medication.  It is important for you to complete the blood thinner medication as prescribed by your surgeon.  Continue your approved medications as instructed at time of  discharge. ° °PRECAUTIONS °If you experience chest pain or shortness of breath - call 911 immediately for transfer to the hospital emergency department.  °If you develop a fever greater that 101 F, purulent drainage from wound, increased redness or drainage from wound, foul odor from the wound/dressing, or calf pain - CONTACT YOUR SURGEON.   °                                                °FOLLOW-UP APPOINTMENTS °Make sure you keep all of your appointments after your operation with your surgeon and caregivers. You should call the office at the above phone number and make an appointment for approximately two weeks after the date of your surgery or on the date instructed by your surgeon outlined in the "After Visit Summary". ° ° °RANGE OF MOTION AND STRENGTHENING EXERCISES  °Rehabilitation of the knee is important following a knee   injury or an operation. After just a few days of immobilization, the muscles of the thigh which control the knee become weakened and shrink (atrophy). Knee exercises are designed to build up the tone and strength of the thigh muscles and to improve knee motion. Often times heat used for twenty to thirty minutes before working out will loosen up your tissues and help with improving the range of motion but do not use heat for the first two weeks following surgery. These exercises can be done on a training (exercise) mat, on the floor, on a table or on a bed. Use what ever works the best and is most comfortable for you Knee exercises include:  °Leg Lifts - While your knee is still immobilized in a splint or cast, you can do straight leg raises. Lift the leg to 60 degrees, hold for 3 sec, and slowly lower the leg. Repeat 10-20 times 2-3 times daily. Perform this exercise against resistance later as your knee gets better.  °Quad and Hamstring Sets - Tighten up the muscle on the front of the thigh (Quad) and hold for 5-10 sec. Repeat this 10-20 times hourly. Hamstring sets are done by pushing the  foot backward against an object and holding for 5-10 sec. Repeat as with quad sets.  °· Leg Slides: Lying on your back, slowly slide your foot toward your buttocks, bending your knee up off the floor (only go as far as is comfortable). Then slowly slide your foot back down until your leg is flat on the floor again. °· Angel Wings: Lying on your back spread your legs to the side as far apart as you can without causing discomfort.  °A rehabilitation program following serious knee injuries can speed recovery and prevent re-injury in the future due to weakened muscles. Contact your doctor or a physical therapist for more information on knee rehabilitation.  ° °IF YOU ARE TRANSFERRED TO A SKILLED REHAB FACILITY °If the patient is transferred to a skilled rehab facility following release from the hospital, a list of the current medications will be sent to the facility for the patient to continue.  When discharged from the skilled rehab facility, please have the facility set up the patient's Home Health Physical Therapy prior to being released. Also, the skilled facility will be responsible for providing the patient with their medications at time of release from the facility to include their pain medication, the muscle relaxants, and their blood thinner medication. If the patient is still at the rehab facility at time of the two week follow up appointment, the skilled rehab facility will also need to assist the patient in arranging follow up appointment in our office and any transportation needs. ° °MAKE SURE YOU:  °Understand these instructions.  °Get help right away if you are not doing well or get worse.  ° ° °Pick up stool softner and laxative for home use following surgery while on pain medications. °Do not submerge incision under water. °Please use good hand washing techniques while changing dressing each day. °May shower starting three days after surgery. °Please use a clean towel to pat the incision dry following  showers. °Continue to use ice for pain and swelling after surgery. °Do not use any lotions or creams on the incision until instructed by your surgeon. ° °Take Xarelto for two and a half more weeks following discharge from the hospital, then discontinue Xarelto. °Once the patient has completed the blood thinner regimen, then take a Baby 81 mg Aspirin daily   for three more weeks. ° ° °Information on my medicine - XARELTO® (Rivaroxaban) ° ° °Why was Xarelto® prescribed for you? °Xarelto® was prescribed for you to reduce the risk of blood clots forming after orthopedic surgery. The medical term for these abnormal blood clots is venous thromboembolism (VTE). ° °What do you need to know about xarelto® ? °Take your Xarelto® ONCE DAILY at the same time every day. °You may take it either with or without food. ° °If you have difficulty swallowing the tablet whole, you may crush it and mix in applesauce just prior to taking your dose. ° °Take Xarelto® exactly as prescribed by your doctor and DO NOT stop taking Xarelto® without talking to the doctor who prescribed the medication.  Stopping without other VTE prevention medication to take the place of Xarelto® may increase your risk of developing a clot. ° °After discharge, you should have regular check-up appointments with your healthcare provider that is prescribing your Xarelto®.   ° °What do you do if you miss a dose? °If you miss a dose, take it as soon as you remember on the same day then continue your regularly scheduled once daily regimen the next day. Do not take two doses of Xarelto® on the same day.  ° °Important Safety Information °A possible side effect of Xarelto® is bleeding. You should call your healthcare provider right away if you experience any of the following: °? Bleeding from an injury or your nose that does not stop. °? Unusual colored urine (red or dark brown) or unusual colored stools (red or black). °? Unusual bruising for unknown reasons. °? A serious  fall or if you hit your head (even if there is no bleeding). ° °Some medicines may interact with Xarelto® and might increase your risk of bleeding while on Xarelto®. To help avoid this, consult your healthcare provider or pharmacist prior to using any new prescription or non-prescription medications, including herbals, vitamins, non-steroidal anti-inflammatory drugs (NSAIDs) and supplements. ° °This website has more information on Xarelto®: www.xarelto.com. ° ° °

## 2017-01-05 LAB — BASIC METABOLIC PANEL
ANION GAP: 8 (ref 5–15)
BUN: 9 mg/dL (ref 6–20)
CO2: 24 mmol/L (ref 22–32)
Calcium: 8.3 mg/dL — ABNORMAL LOW (ref 8.9–10.3)
Chloride: 108 mmol/L (ref 101–111)
Creatinine, Ser: 0.52 mg/dL (ref 0.44–1.00)
GFR calc Af Amer: >60 mL/min (ref >60)
Glucose, Bld: 121 mg/dL — ABNORMAL HIGH (ref 65–99)
POTASSIUM: 3.7 mmol/L (ref 3.5–5.1)
SODIUM: 140 mmol/L (ref 135–145)

## 2017-01-05 LAB — CBC
HCT: 31.7 % — ABNORMAL LOW (ref 36.0–46.0)
HEMOGLOBIN: 10.5 g/dL — AB (ref 12.0–15.0)
MCH: 31 pg (ref 26.0–34.0)
MCHC: 33.1 g/dL (ref 30.0–36.0)
MCV: 93.5 fL (ref 78.0–100.0)
Platelets: 173 10*3/uL (ref 150–400)
RBC: 3.39 MIL/uL — AB (ref 3.87–5.11)
RDW: 13.1 % (ref 11.5–15.5)
WBC: 10 10*3/uL (ref 4.0–10.5)

## 2017-01-05 NOTE — Plan of Care (Signed)
Problem: Skin Integrity: Goal: Risk for impaired skin integrity will decrease Outcome: Progressing No skin impairement noted  Problem: Tissue Perfusion: Goal: Risk factors for ineffective tissue perfusion will decrease Outcome: Progressing SCDs are on, patient is on Xeralto  Problem: Activity: Goal: Risk for activity intolerance will decrease Outcome: Progressing Sat bed side , she tolerated well  Problem: Bowel/Gastric: Goal: Will not experience complications related to bowel motility Outcome: Progressing No issues reported  Problem: Pain Management: Goal: Pain level will decrease with appropriate interventions Outcome: Progressing Medicated twice for pain with moderate relief

## 2017-01-05 NOTE — Evaluation (Signed)
Physical Therapy Evaluation Patient Details Name: Gabriella Nguyen MRN: [id number] DOB: 11/04/1955 Today's Date: 01/05/2017   History of Present Illness  s/p R TKR revision.    Clinical Impression  Pt s/p R TKR revision and presents with decreased R LE strength/ROM and post op pain limiting functional mobility.  Pt should progress to dc home with family assist and HHPT follow up.    Follow Up Recommendations Home health PT    Equipment Recommendations  None recommended by PT    Recommendations for Other Services OT consult     Precautions / Restrictions Precautions Precautions: Knee;Fall Required Braces or Orthoses: Knee Immobilizer - Right Knee Immobilizer - Right: Discontinue once straight leg raise with < 10 degree lag Restrictions Weight Bearing Restrictions: No Other Position/Activity Restrictions: WBAT      Mobility  Bed Mobility Overal bed mobility: Needs Assistance Bed Mobility: Supine to Sit     Supine to sit: Min assist     General bed mobility comments: cues for sequence and use of L LE to self assist  Transfers Overall transfer level: Needs assistance Equipment used: Rolling walker (2 wheeled) Transfers: Sit to/from Stand Sit to Stand: Min assist         General transfer comment: cues for UE and LE placement  Ambulation/Gait Ambulation/Gait assistance: Min assist;Min guard Ambulation Distance (Feet): 75 Feet Assistive device: Rolling walker (2 wheeled) Gait Pattern/deviations: Step-to pattern;Decreased step length - right;Decreased step length - left;Shuffle;Trunk flexed Gait velocity: decr Gait velocity interpretation: Below normal speed for age/gender General Gait Details: cues for sequence, posture and position from [employer]            Wheelchair Mobility    Modified Rankin (Stroke Patients Only)       Balance                                             Pertinent Vitals/Pain Pain Assessment: 0-10 Pain  Score: 3  Pain Location: R knee Pain Descriptors / Indicators: Aching;Sore Pain Intervention(s): Limited activity within patient's tolerance;Monitored during session;Premedicated before session;Ice applied    Home Living Family/patient expects to be discharged to:: Private residence Living Arrangements: Children Available Help at Discharge: Family Type of Home: House Home Access: Stairs to enter Entrance Stairs-Rails: None (Pull handle on one side) Entrance Stairs-Number of Steps: 2 Home Layout: One level Home Equipment: Bedside commode;Shower seat - built in;Walker - 2 wheels      Prior Function Level of Independence: Independent               Hand Dominance        Extremity/Trunk Assessment   Upper Extremity Assessment Upper Extremity Assessment: Overall WFL for tasks assessed    Lower Extremity Assessment Lower Extremity Assessment: RLE deficits/detail RLE Deficits / Details: 2+/3 quads with AAROM at knee -10-  50       Communication   Communication: No difficulties  Cognition Arousal/Alertness: Awake/alert Behavior During Therapy: WFL for tasks assessed/performed Overall Cognitive Status: Within Functional Limits for tasks assessed                                        General Comments      Exercises Total Joint Exercises Ankle Circles/Pumps: AROM;Both;20 reps;Supine Quad Sets: AROM;Both;10 reps;Supine Heel  Slides: AAROM;Right;15 reps;Supine Straight Leg Raises: AAROM;Right;10 reps;Supine   Assessment/Plan    PT Assessment Patient needs continued PT services  PT Problem List Decreased strength;Decreased range of motion;Decreased activity tolerance;Decreased mobility;Decreased knowledge of use of DME;Pain       PT Treatment Interventions DME instruction;Gait training;Stair training;Functional mobility training;Therapeutic activities;Therapeutic exercise;Patient/family education    PT Goals (Current goals can be found in the Care  Plan section)  Acute Rehab PT Goals Patient Stated Goal: return to independence PT Goal Formulation: With patient Time For Goal Achievement: 01/09/17 Potential to Achieve Goals: Good    Frequency 7X/week   Barriers to discharge        Co-evaluation               AM-PAC PT "6 Clicks" Daily Activity  Outcome Measure Difficulty turning over in bed (including adjusting bedclothes, sheets and blankets)?: A Lot Difficulty moving from lying on back to sitting on the side of the bed? : A Lot Difficulty sitting down on and standing up from a chair with arms (e.g., wheelchair, bedside commode, etc,.)?: Unable Help needed moving to and from a bed to chair (including a wheelchair)?: A Little Help needed walking in hospital room?: A Little Help needed climbing 3-5 steps with a railing? : A Little 6 Click Score: 14    End of Session Equipment Utilized During Treatment: Gait belt;Right knee immobilizer Activity Tolerance: Patient tolerated treatment well Patient left: in chair;with call bell/phone within reach;with family/visitor present Nurse Communication: Mobility status PT Visit Diagnosis: Difficulty in walking, not elsewhere classified (R26.2)    Time: 1610-9604 PT Time Calculation (min) (ACUTE ONLY): 38 min   Charges:   PT Evaluation $PT Eval Low Complexity: 1 Low PT Treatments $Gait Training: 8-22 mins $Therapeutic Exercise: 8-22 mins   PT G Codes:        Pg [phone]   Jereld Presti 01/05/2017, 12:39 PM

## 2017-01-05 NOTE — Evaluation (Signed)
Occupational Therapy Evaluation Patient Details Name: Gabriella Nguyen MRN: [id number] DOB: 1955-05-02 Today's Date: 01/05/2017    History of Present Illness s/p R TKR   Clinical Impression   This 61 year old female was admitted for the above sx.  She will benefit from one more session of OT to review and practice toilet and shower transfers.  Goals are for min guard assist    Follow Up Recommendations  Supervision/Assistance - 24 hour    Equipment Recommendations  None recommended by OT    Recommendations for Other Services       Precautions / Restrictions Precautions Precautions: Knee;Fall Required Braces or Orthoses: Knee Immobilizer - Right Restrictions Weight Bearing Restrictions: No      Mobility Bed Mobility               General bed mobility comments: oob  Transfers Overall transfer level: Needs assistance Equipment used: Rolling walker (2 wheeled) Transfers: Sit to/from Stand Sit to Stand: Min assist         General transfer comment: cues for UE and LE placement    Balance                                           ADL either performed or assessed with clinical judgement   ADL Overall ADL's : Needs assistance/impaired Eating/Feeding: Independent   Grooming: Oral care;Supervision/safety;Standing   Upper Body Bathing: Set up;Sitting   Lower Body Bathing: Moderate assistance;Sit to/from stand   Upper Body Dressing : Set up;Sitting   Lower Body Dressing: Maximal assistance;Sit to/from stand   Toilet Transfer: Minimal assistance;Ambulation;BSC;RW   Toileting- Architect and Hygiene: Min guard;Sit to/from stand         General ADL Comments: ambulated to bathroom, used commode and completed ADL     Vision         Perception     Praxis      Pertinent Vitals/Pain Pain Assessment: 0-10 Pain Score: 5  Pain Location: R knee Pain Descriptors / Indicators: Aching Pain Intervention(s): Limited activity  within patient's tolerance;Monitored during session;Premedicated before session;Repositioned;Ice applied     Hand Dominance     Extremity/Trunk Assessment Upper Extremity Assessment Upper Extremity Assessment: Overall WFL for tasks assessed           Communication Communication Communication: No difficulties   Cognition Arousal/Alertness: Awake/alert Behavior During Therapy: WFL for tasks assessed/performed Overall Cognitive Status: Within Functional Limits for tasks assessed                                     General Comments       Exercises     Shoulder Instructions      Home Living Family/patient expects to be discharged to:: Private residence Living Arrangements: Children Available Help at Discharge: Family               Bathroom Shower/Tub: Walk-in Soil scientist Toilet: Handicapped height     Home Equipment: Bedside commode;Shower seat - built in          Prior Functioning/Environment Level of Independence: Independent                 OT Problem List: Decreased strength;Decreased activity tolerance;Pain;Decreased knowledge of use of DME or AE      OT Treatment/Interventions:  Self-care/ADL training;DME and/or AE instruction;Patient/family education    OT Goals(Current goals can be found in the care plan section) Acute Rehab OT Goals Patient Stated Goal: return to independence OT Goal Formulation: With patient Time For Goal Achievement: 01/12/17 Potential to Achieve Goals: Good ADL Goals Pt Will Transfer to Toilet: with min guard assist;bedside commode;ambulating Pt Will Perform Tub/Shower Transfer: Shower transfer;with min guard assist;ambulating;3 in 1  OT Frequency: Min 2X/week   Barriers to D/C:            Co-evaluation              AM-PAC PT "6 Clicks" Daily Activity     Outcome Measure Help from another person eating meals?: None Help from another person taking care of personal grooming?: A  Little Help from another person toileting, which includes using toliet, bedpan, or urinal?: A Little Help from another person bathing (including washing, rinsing, drying)?: A Lot Help from another person to put on and taking off regular upper body clothing?: A Little Help from another person to put on and taking off regular lower body clothing?: A Lot 6 Click Score: 17   End of Session    Activity Tolerance: Patient tolerated treatment well Patient left: in chair;with call bell/phone within reach;with family/visitor present  OT Visit Diagnosis: Pain Pain - Right/Left: Right Pain - part of body: Knee                Time: 1030-1100 OT Time Calculation (min): 30 min Charges:  OT General Charges $OT Visit: 1 Visit OT Evaluation $OT Eval Low Complexity: 1 Low OT Treatments $Self Care/Home Management : 8-22 mins G-Codes:     Gabriella Nguyen, OTR/L [phone] 01/05/2017  Gabriella Nguyen 01/05/2017, 12:03 PM

## 2017-01-05 NOTE — Progress Notes (Signed)
Physical Therapy Treatment Patient Details Name: Gabriella Nguyen MRN: [id number] DOB: [birth year] Today's Date: 01/05/2017    History of Present Illness s/p R TKR revision.      PT Comments    Pt continues very motivated and progressing steadily with mobility.  Pt hopeful for dc home tomorrow.   Follow Up Recommendations  Home health PT     Equipment Recommendations  None recommended by PT    Recommendations for Other Services OT consult     Precautions / Restrictions Precautions Precautions: Knee;Fall Required Braces or Orthoses: Knee Immobilizer - Right Knee Immobilizer - Right: Discontinue once straight leg raise with < 10 degree lag Restrictions Weight Bearing Restrictions: No Other Position/Activity Restrictions: WBAT    Mobility  Bed Mobility Overal bed mobility: Needs Assistance Bed Mobility: Sit to Supine       Sit to supine: Min assist   General bed mobility comments: cues for sequence and use of L LE to self assist  Transfers Overall transfer level: Needs assistance Equipment used: Rolling walker (2 wheeled) Transfers: Sit to/from Stand Sit to Stand: Min assist;Min guard         General transfer comment: cues for UE and LE placement  Ambulation/Gait Ambulation/Gait assistance: Min guard Ambulation Distance (Feet): 130 Feet (and 15' back from bathroom) Assistive device: Rolling walker (2 wheeled) Gait Pattern/deviations: Step-to pattern;Decreased step length - right;Decreased step length - left;Shuffle;Trunk flexed Gait velocity: decr Gait velocity interpretation: Below normal speed for age/gender General Gait Details: cues for sequence, posture and position from [employer]            Wheelchair Mobility    Modified Rankin (Stroke Patients Only)       Balance                                            Cognition Arousal/Alertness: Awake/alert Behavior During Therapy: WFL for tasks assessed/performed Overall  Cognitive Status: Within Functional Limits for tasks assessed                                        Exercises      General Comments        Pertinent Vitals/Pain Pain Assessment: 0-10 Pain Score: 3  Pain Location: R knee Pain Descriptors / Indicators: Aching;Sore Pain Intervention(s): Limited activity within patient's tolerance;Monitored during session;Premedicated before session;Ice applied    Home Living                      Prior Function            PT Goals (current goals can now be found in the care plan section) Acute Rehab PT Goals Patient Stated Goal: return to independence PT Goal Formulation: With patient Time For Goal Achievement: 01/09/17 Potential to Achieve Goals: Good Progress towards PT goals: Progressing toward goals    Frequency    7X/week      PT Plan Current plan remains appropriate    Co-evaluation              AM-PAC PT "6 Clicks" Daily Activity  Outcome Measure  Difficulty turning over in bed (including adjusting bedclothes, sheets and blankets)?: A Lot Difficulty moving from lying on back to sitting on the side of the bed? :  A Lot Difficulty sitting down on and standing up from a chair with arms (e.g., wheelchair, bedside commode, etc,.)?: A Lot Help needed moving to and from a bed to chair (including a wheelchair)?: A Little Help needed walking in hospital room?: A Little Help needed climbing 3-5 steps with a railing? : A Little 6 Click Score: 15    End of Session Equipment Utilized During Treatment: Gait belt;Right knee immobilizer Activity Tolerance: Patient tolerated treatment well Patient left: in bed;with call bell/phone within reach Nurse Communication: Mobility status PT Visit Diagnosis: Difficulty in walking, not elsewhere classified (R26.2)     Time: 1330-1402 PT Time Calculation (min) (ACUTE ONLY): 32 min  Charges:  $Gait Training: 8-22 mins $Therapeutic Activity: 8-22 mins                     G Codes:       Pg [phone]    Jernee Murtaugh 01/05/2017, 4:27 PM

## 2017-01-05 NOTE — Progress Notes (Signed)
   Subjective: 1 Day Post-Op Procedure(s) (LRB): Right knee total knee arthroplasty revision (Right) Patient reports pain as mild and moderate.   Patient seen in rounds by Dr. Lequita Halt. Patient is well, but has had some minor complaints of pain in the knee, requiring pain medications We will start therapy today.  Plan is to go Home after hospital stay.  Objective: Vital signs in last 24 hours: Temp:  [97.5 F (36.4 C)-98.5 F (36.9 C)] 98.5 F (36.9 C) (10/11 0549) Pulse Rate:  [59-78] 76 (10/11 0549) Resp:  [9-24] 16 (10/11 0549) BP: (119-148)/(67-95) 125/71 (10/11 0549) SpO2:  [93 %-100 %] 100 % (10/11 0549) Weight:  [104.3 kg (230 lb)] 104.3 kg (230 lb) (10/10 1325)  Intake/Output from previous day:  Intake/Output Summary (Last 24 hours) at 01/05/17 0737 Last data filed at 01/05/17 0630  Gross per 24 hour  Intake             3610 ml  Output             3600 ml  Net               10 ml    Intake/Output this shift: No intake/output data recorded.  Labs:  Recent Labs  01/05/17 0523  HGB 10.5*    Recent Labs  01/05/17 0523  WBC 10.0  RBC 3.39*  HCT 31.7*  PLT 173    Recent Labs  01/05/17 0523  NA 140  K 3.7  CL 108  CO2 24  BUN 9  CREATININE 0.52  GLUCOSE 121*  CALCIUM 8.3*   No results for input(s): LABPT, INR in the last 72 hours.  EXAM General - Patient is Alert, Appropriate and Oriented Extremity - Neurovascular intact Sensation intact distally Intact pulses distally Dressing - dressing C/D/I Motor Function - intact, moving foot and toes well on exam.  Hemovac pulled without difficulty.  Past Medical History:  Diagnosis Date  . Aspiration pneumonia (HCC) 06/2011 hosp  . ASTHMA   . OSTEOARTHRITIS, KNEES, BILATERAL   . Unspecified essential hypertension     Assessment/Plan: 1 Day Post-Op Procedure(s) (LRB): Right knee total knee arthroplasty revision (Right) Principal Problem:   Failed total knee arthroplasty (HCC)  Estimated body  mass index is 31.19 kg/m as calculated from the following:   Height as of this encounter: 6' (1.829 m).   Weight as of this encounter: 104.3 kg (230 lb). Advance diet Up with therapy Plan for discharge tomorrow Discharge home with home health  DVT Prophylaxis - Xarelto Weight-Bearing as tolerated to right leg D/C O2 and Pulse OX and try on Room Air  Avel Peace, PA-C Orthopaedic Surgery 01/05/2017, 7:37 AM

## 2017-01-05 NOTE — Progress Notes (Signed)
Pt discharging with [facility] at home for 2 weeks. No DME is needed/pt.

## 2017-01-06 LAB — BASIC METABOLIC PANEL
ANION GAP: 7 (ref 5–15)
BUN: 8 mg/dL (ref 6–20)
CALCIUM: 8.6 mg/dL — AB (ref 8.9–10.3)
CO2: 30 mmol/L (ref 22–32)
Chloride: 105 mmol/L (ref 101–111)
Creatinine, Ser: 0.54 mg/dL (ref 0.44–1.00)
GFR calc Af Amer: >60 mL/min (ref >60)
Glucose, Bld: 93 mg/dL (ref 65–99)
POTASSIUM: 3.2 mmol/L — AB (ref 3.5–5.1)
SODIUM: 142 mmol/L (ref 135–145)

## 2017-01-06 LAB — CBC
HCT: 30 % — ABNORMAL LOW (ref 36.0–46.0)
Hemoglobin: 9.9 g/dL — ABNORMAL LOW (ref 12.0–15.0)
MCH: 30.9 pg (ref 26.0–34.0)
MCHC: 33 g/dL (ref 30.0–36.0)
MCV: 93.8 fL (ref 78.0–100.0)
PLATELETS: 164 10*3/uL (ref 150–400)
RBC: 3.2 MIL/uL — AB (ref 3.87–5.11)
RDW: 12.9 % (ref 11.5–15.5)
WBC: 8.5 10*3/uL (ref 4.0–10.5)

## 2017-01-06 MED ORDER — RIVAROXABAN 10 MG PO TABS: 10.0000 mg | ORAL_TABLET | Freq: Every day | ORAL | 0 refills | Status: DC

## 2017-01-06 MED ORDER — METHOCARBAMOL 500 MG PO TABS: 500.0000 mg | ORAL_TABLET | Freq: Four times a day (QID) | ORAL | 0 refills | Status: DC | PRN

## 2017-01-06 MED ORDER — POTASSIUM CHLORIDE CRYS ER 20 MEQ PO TBCR
40.0000 meq | EXTENDED_RELEASE_TABLET | ORAL | Status: AC
  Administered 2017-01-06 (×2): 40 meq via ORAL
  Filled 2017-01-06 (×2): qty 2

## 2017-01-06 MED ORDER — OXYCODONE HCL 5 MG PO TABS: 5.0000 mg | ORAL_TABLET | ORAL | 0 refills | Status: DC | PRN

## 2017-01-06 MED ORDER — TRAMADOL HCL 50 MG PO TABS: 50.0000 mg | ORAL_TABLET | Freq: Four times a day (QID) | ORAL | 0 refills | Status: DC | PRN

## 2017-01-06 NOTE — Progress Notes (Signed)
   Subjective: 2 Days Post-Op Procedure(s) (LRB): Right knee total knee arthroplasty revision (Right) Patient reports pain as mild.   Patient seen in rounds by Dr. Lequita Halt. Patient is well, but has had some minor complaints of pain in the knee, requiring pain medications Patient is ready to go home today.  She walked 75 feet and 130 feet yesterday.  Objective: Vital signs in last 24 hours: Temp:  [98.4 F (36.9 C)-98.7 F (37.1 C)] 98.5 F (36.9 C) (10/12 0611) Pulse Rate:  [70-79] 71 (10/12 0611) Resp:  [16-17] 16 (10/12 0611) BP: (114-133)/(55-77) 133/69 (10/12 0611) SpO2:  [97 %-100 %] 98 % (10/12 0611)  Intake/Output from previous day:  Intake/Output Summary (Last 24 hours) at 01/06/17 0730 Last data filed at 01/06/17 4098  Gross per 24 hour  Intake             1420 ml  Output             1500 ml  Net              -80 ml    Intake/Output this shift: No intake/output data recorded.  Labs:  Recent Labs  01/05/17 0523 01/06/17 0527  HGB 10.5* 9.9*    Recent Labs  01/05/17 0523 01/06/17 0527  WBC 10.0 8.5  RBC 3.39* 3.20*  HCT 31.7* 30.0*  PLT 173 164    Recent Labs  01/05/17 0523 01/06/17 0527  NA 140 142  K 3.7 3.2*  CL 108 105  CO2 24 30  BUN 9 8  CREATININE 0.52 0.54  GLUCOSE 121* 93  CALCIUM 8.3* 8.6*   No results for input(s): LABPT, INR in the last 72 hours.  EXAM: General - Patient is Alert, Appropriate and Oriented Extremity - Neurovascular intact Sensation intact distally Intact pulses distally Dorsiflexion/Plantar flexion intact Incision - clean, dry, no drainage Motor Function - intact, moving foot and toes well on exam.   Assessment/Plan: 2 Days Post-Op Procedure(s) (LRB): Right knee total knee arthroplasty revision (Right) Procedure(s) (LRB): Right knee total knee arthroplasty revision (Right) Past Medical History:  Diagnosis Date  . Aspiration pneumonia (HCC) 06/2011 hosp  . ASTHMA   . OSTEOARTHRITIS, KNEES, BILATERAL     . Unspecified essential hypertension    Principal Problem:   Failed total knee arthroplasty (HCC)  Estimated body mass index is 31.19 kg/m as calculated from the following:   Height as of this encounter: 6' (1.829 m).   Weight as of this encounter: 104.3 kg (230 lb). Up with therapy Discharge home with home health Diet - Regular diet Follow up - in 2 weeks Activity - WBAT Disposition - Home Condition Upon Discharge - Good D/C Meds - See DC Summary DVT Prophylaxis - Xarelto  Avel Peace, PA-C Orthopaedic Surgery 01/06/2017, 7:30 AM

## 2017-01-06 NOTE — Discharge Summary (Signed)
Physician Discharge Summary   Patient ID: Gabriella Nguyen MRN: [id number] DOB/AGE: [birth year] 61 y.o.  Admit date: 01/04/2017 Discharge date: 01-06-2017  Primary Diagnosis:  Unstable right total knee arthroplasty.  Admission Diagnoses:  Past Medical History:  Diagnosis Date  . Aspiration pneumonia ([place]) 06/2011 hosp  . ASTHMA   . OSTEOARTHRITIS, KNEES, BILATERAL   . Unspecified essential hypertension    Discharge Diagnoses:   Principal Problem:   Failed total knee arthroplasty ([place])  Estimated body mass index is 31.19 kg/m as calculated from the following:   Height as of this encounter: 6' (1.829 m).   Weight as of this encounter: 104.3 kg (230 lb).  Procedure:  Procedure(s) (LRB): Right knee total knee arthroplasty revision (Right)   Consults: None  HPI: Gabriella is a 61 year old female, who had a total knee arthroplasty done at the [facility] a couple years ago.  She has had significant pain and dysfunction with gross instability on exam.  Due to her pain and instability, she presents now for polyethylene versus total knee revision.  Laboratory Data: Admission on 01/04/2017  Component Date Value Ref Range Status  . WBC 01/05/2017 10.0  4.0 - 10.5 K/uL Final  . RBC 01/05/2017 3.39* 3.87 - 5.11 MIL/uL Final  . Hemoglobin 01/05/2017 10.5* 12.0 - 15.0 g/dL Final  . HCT 01/05/2017 31.7* 36.0 - 46.0 % Final  . MCV 01/05/2017 93.5  78.0 - 100.0 fL Final  . MCH 01/05/2017 31.0  26.0 - 34.0 pg Final  . MCHC 01/05/2017 33.1  30.0 - 36.0 g/dL Final  . RDW 01/05/2017 13.1  11.5 - 15.5 % Final  . Platelets 01/05/2017 173  150 - 400 K/uL Final  . Sodium 01/05/2017 140  135 - 145 mmol/L Final  . Potassium 01/05/2017 3.7  3.5 - 5.1 mmol/L Final  . Chloride 01/05/2017 108  101 - 111 mmol/L Final  . CO2 01/05/2017 24  22 - 32 mmol/L Final  . Glucose, Bld 01/05/2017 121* 65 - 99 mg/dL Final  . BUN 01/05/2017 9  6 - 20 mg/dL Final  . Creatinine, Ser 01/05/2017 0.52  0.44 - 1.00  mg/dL Final  . Calcium 01/05/2017 8.3* 8.9 - 10.3 mg/dL Final  . GFR calc non Af Amer 01/05/2017 >60  >60 mL/min Final  . GFR calc Af Amer 01/05/2017 >60  >60 mL/min Final   Comment: (NOTE) The eGFR has been calculated using the CKD EPI equation. This calculation has not been validated in all clinical situations. eGFR's persistently <60 mL/min signify possible Chronic Kidney Disease.   . Anion gap 01/05/2017 8  5 - 15 Final  . WBC 01/06/2017 8.5  4.0 - 10.5 K/uL Final  . RBC 01/06/2017 3.20* 3.87 - 5.11 MIL/uL Final  . Hemoglobin 01/06/2017 9.9* 12.0 - 15.0 g/dL Final  . HCT 01/06/2017 30.0* 36.0 - 46.0 % Final  . MCV 01/06/2017 93.8  78.0 - 100.0 fL Final  . MCH 01/06/2017 30.9  26.0 - 34.0 pg Final  . MCHC 01/06/2017 33.0  30.0 - 36.0 g/dL Final  . RDW 01/06/2017 12.9  11.5 - 15.5 % Final  . Platelets 01/06/2017 164  150 - 400 K/uL Final  . Sodium 01/06/2017 142  135 - 145 mmol/L Final  . Potassium 01/06/2017 3.2* 3.5 - 5.1 mmol/L Final  . Chloride 01/06/2017 105  101 - 111 mmol/L Final  . CO2 01/06/2017 30  22 - 32 mmol/L Final  . Glucose, Bld 01/06/2017 93  65 - 99 mg/dL Final  .  BUN 01/06/2017 8  6 - 20 mg/dL Final  . Creatinine, Ser 01/06/2017 0.54  0.44 - 1.00 mg/dL Final  . Calcium 01/06/2017 8.6* 8.9 - 10.3 mg/dL Final  . GFR calc non Af Amer 01/06/2017 >60  >60 mL/min Final  . GFR calc Af Amer 01/06/2017 >60  >60 mL/min Final   Comment: (NOTE) The eGFR has been calculated using the CKD EPI equation. This calculation has not been validated in all clinical situations. eGFR's persistently <60 mL/min signify possible Chronic Kidney Disease.   Gabriella Nguyen gap 01/06/2017 7  5 - 15 Final  Hospital Outpatient Visit on 12/28/2016  Component Date Value Ref Range Status  . aPTT 12/28/2016 30  24 - 36 seconds Final  . WBC 12/28/2016 5.9  4.0 - 10.5 K/uL Final  . RBC 12/28/2016 4.25  3.87 - 5.11 MIL/uL Final  . Hemoglobin 12/28/2016 13.1  12.0 - 15.0 g/dL Final  . HCT 12/28/2016  38.6  36.0 - 46.0 % Final  . MCV 12/28/2016 90.8  78.0 - 100.0 fL Final  . MCH 12/28/2016 30.8  26.0 - 34.0 pg Final  . MCHC 12/28/2016 33.9  30.0 - 36.0 g/dL Final  . RDW 12/28/2016 13.0  11.5 - 15.5 % Final  . Platelets 12/28/2016 221  150 - 400 K/uL Final  . Sodium 12/28/2016 136  135 - 145 mmol/L Final  . Potassium 12/28/2016 3.3* 3.5 - 5.1 mmol/L Final  . Chloride 12/28/2016 102  101 - 111 mmol/L Final  . CO2 12/28/2016 28  22 - 32 mmol/L Final  . Glucose, Bld 12/28/2016 94  65 - 99 mg/dL Final  . BUN 12/28/2016 14  6 - 20 mg/dL Final  . Creatinine, Ser 12/28/2016 0.61  0.44 - 1.00 mg/dL Final  . Calcium 12/28/2016 9.0  8.9 - 10.3 mg/dL Final  . Total Protein 12/28/2016 7.0  6.5 - 8.1 g/dL Final  . Albumin 12/28/2016 3.9  3.5 - 5.0 g/dL Final  . AST 12/28/2016 18  15 - 41 U/L Final  . ALT 12/28/2016 12* 14 - 54 U/L Final  . Alkaline Phosphatase 12/28/2016 62  38 - 126 U/L Final  . Total Bilirubin 12/28/2016 0.5  0.3 - 1.2 mg/dL Final  . GFR calc non Af Amer 12/28/2016 >60  >60 mL/min Final  . GFR calc Af Amer 12/28/2016 >60  >60 mL/min Final   Comment: (NOTE) The eGFR has been calculated using the CKD EPI equation. This calculation has not been validated in all clinical situations. eGFR's persistently <60 mL/min signify possible Chronic Kidney Disease.   . Anion gap 12/28/2016 6  5 - 15 Final  . Prothrombin Time 12/28/2016 12.7  11.4 - 15.2 seconds Final  . INR 12/28/2016 0.97   Final  . ABO/RH(D) 12/28/2016 O POS   Final  . Antibody Screen 12/28/2016 NEG   Final  . Sample Expiration 12/28/2016 01/07/2017   Final  . Extend sample reason 12/28/2016 NO TRANSFUSIONS OR PREGNANCY IN THE PAST 3 MONTHS   Final  . MRSA, PCR 12/28/2016 NEGATIVE  NEGATIVE Final  . Staphylococcus aureus 12/28/2016 NEGATIVE  NEGATIVE Final   Comment: (NOTE) The Xpert SA Assay (FDA approved for NASAL specimens in patients 4 years of age and older), is one component of a comprehensive surveillance  program. It is not intended to diagnose infection nor to guide or monitor treatment.   . ABO/RH(D) 12/28/2016 O POS   Final     X-Rays:No results found.  EKG: Orders placed or  performed during the hospital encounter of 12/28/16  . EKG 12 lead  . EKG 12 lead     Hospital Course: Gabriella Nguyen is a 61 y.o. who was admitted to [facility]. They were brought to the operating room on 01/04/2017 and underwent Procedure(s): Right knee total knee arthroplasty revision.  Patient tolerated the procedure well and was later transferred to the recovery room and then to the orthopaedic floor for postoperative care.  They were given PO and IV analgesics for pain control following their surgery.  They were given 24 hours of postoperative antibiotics of  Anti-infectives    Start     Dose/Rate Route Frequency Ordered Stop   01/04/17 1600  ceFAZolin (ANCEF) IVPB 2g/100 mL premix     2 g 200 mL/hr over 30 Minutes Intravenous Every 6 hours 01/04/17 1331 01/04/17 2207   01/04/17 0816  ceFAZolin (ANCEF) 2-4 GM/100ML-% IVPB    Comments:  Bridget Hartshorn   : cabinet override      01/04/17 0816 01/04/17 0947   01/04/17 0810  ceFAZolin (ANCEF) IVPB 2g/100 mL premix     2 g 200 mL/hr over 30 Minutes Intravenous On call to O.R. 01/04/17 6237 01/04/17 0947     and started on DVT prophylaxis in the form of Xarelto.   PT and OT were ordered for total joint protocol.  Discharge planning consulted to help with postop disposition and equipment needs.  Patient had a good night on the evening of surgery.  They started to get up OOB with therapy on day one. Hemovac drain was pulled without difficulty.  Continued to work with therapy into day two.  Dressing was changed on day two and the incision was healing well.Patient was seen in rounds by Dr. Wynelle Link and was ready to go home.  Discharge home with home health Diet - Regular diet Follow up - in 2 weeks Activity - WBAT Disposition - Home Condition Upon  Discharge - Good D/C Meds - See DC Summary DVT Prophylaxis - Xarelto  Discharge Instructions    Call MD / Call 911    Complete by:  As directed    If you experience chest pain or shortness of breath, CALL 911 and be transported to the hospital emergency room.  If you develope a fever above 101 F, pus (white drainage) or increased drainage or redness at the wound, or calf pain, call your surgeon's office.   Change dressing    Complete by:  As directed    Change dressing daily with sterile 4 x 4 inch gauze dressing and apply TED hose. Do not submerge the incision under water.   Constipation Prevention    Complete by:  As directed    Drink plenty of fluids.  Prune juice may be helpful.  You may use a stool softener, such as Colace (over the counter) 100 mg twice a day.  Use MiraLax (over the counter) for constipation as needed.   Diet general    Complete by:  As directed    Discharge instructions    Complete by:  As directed    Take Xarelto for two and a half more weeks, then discontinue Xarelto. Once the patient has completed the blood thinner regimen, then take a Baby 81 mg Aspirin daily for three more weeks.   Pick up stool softner and laxative for home use following surgery while on pain medications. Do not submerge incision under water. Please use good hand washing techniques while changing dressing each day.  May shower starting three days after surgery. Please use a clean towel to pat the incision dry following showers. Continue to use ice for pain and swelling after surgery. Do not use any lotions or creams on the incision until instructed by your surgeon.  Wear both TED hose on both legs during the day every day for three weeks, but may remove the TED hose at night at home.  Postoperative Constipation Protocol  Constipation - defined medically as fewer than three stools per week and severe constipation as less than one stool per week.  One of the most common issues patients  have following surgery is constipation.  Even if you have a regular bowel pattern at home, your normal regimen is likely to be disrupted due to multiple reasons following surgery.  Combination of anesthesia, postoperative narcotics, change in appetite and fluid intake all can affect your bowels.  In order to avoid complications following surgery, here are some recommendations in order to help you during your recovery period.  Colace (docusate) - Pick up an over-the-counter form of Colace or another stool softener and take twice a day as long as you are requiring postoperative pain medications.  Take with a full glass of water daily.  If you experience loose stools or diarrhea, hold the colace until you stool forms back up.  If your symptoms do not get better within 1 week or if they get worse, check with your doctor.  Dulcolax (bisacodyl) - Pick up over-the-counter and take as directed by the product packaging as needed to assist with the movement of your bowels.  Take with a full glass of water.  Use this product as needed if not relieved by Colace only.   MiraLax (polyethylene glycol) - Pick up over-the-counter to have on hand.  MiraLax is a solution that will increase the amount of water in your bowels to assist with bowel movements.  Take as directed and can mix with a glass of water, juice, soda, coffee, or tea.  Take if you go more than two days without a movement. Do not use MiraLax more than once per day. Call your doctor if you are still constipated or irregular after using this medication for 7 days in a row.  If you continue to have problems with postoperative constipation, please contact the office for further assistance and recommendations.  If you experience "the worst abdominal pain ever" or develop nausea or vomiting, please contact the office immediatly for further recommendations for treatment.   Do not put a pillow under the knee. Place it under the heel.    Complete by:  As directed      Do not sit on low chairs, stoools or toilet seats, as it may be difficult to get up from low surfaces    Complete by:  As directed    Driving restrictions    Complete by:  As directed    No driving until released by the physician.   Increase activity slowly as tolerated    Complete by:  As directed    Lifting restrictions    Complete by:  As directed    No lifting until released by the physician.   Patient may shower    Complete by:  As directed    You may shower without a dressing once there is no drainage.  Do not wash over the wound.  If drainage remains, do not shower until drainage stops.   TED hose    Complete by:  As  directed    Use stockings (TED hose) for 3 weeks on both leg(s).  You may remove them at night for sleeping.   Weight bearing as tolerated    Complete by:  As directed    Laterality:  right   Extremity:  Lower     Allergies as of 01/06/2017      Reactions   Nifedipine       Medication List    STOP taking these medications   cholecalciferol 1000 units tablet Commonly known as:  VITAMIN D   Diclofenac Sodium 2 % Soln Commonly known as:  PENNSAID   naproxen 375 MG tablet Commonly known as:  NAPROSYN     TAKE these medications   acetaminophen 325 MG tablet Commonly known as:  TYLENOL Take 650 mg by mouth every 6 (six) hours as needed. For pain   albuterol 90 MCG/ACT inhaler Commonly known as:  PROVENTIL,VENTOLIN Inhale 2 puffs into the lungs every 6 (six) hours as needed.   budesonide-formoterol 80-4.5 MCG/ACT inhaler Commonly known as:  SYMBICORT Inhale 2 puffs into the lungs 2 (two) times daily.   carvedilol 12.5 MG tablet Commonly known as:  COREG Take 6.25 mg by mouth 2 (two) times daily with a meal.   hydrochlorothiazide 25 MG tablet Commonly known as:  HYDRODIURIL TAKE ONE (1) TABLET EACH DAY   losartan 100 MG tablet Commonly known as:  COZAAR TAKE 1 TABLET (100 MG TOTAL) BY MOUTH DAILY.   methocarbamol 500 MG tablet Commonly  known as:  ROBAXIN Take 1 tablet (500 mg total) by mouth every 6 (six) hours as needed for muscle spasms.   oxyCODONE 5 MG immediate release tablet Commonly known as:  Oxy IR/ROXICODONE Take 1-2 tablets (5-10 mg total) by mouth every 4 (four) hours as needed for moderate pain or severe pain.   rivaroxaban 10 MG Tabs tablet Commonly known as:  XARELTO Take 1 tablet (10 mg total) by mouth daily with breakfast. Take Xarelto for two and a half more weeks following discharge from the hospital, then discontinue Xarelto. Once the patient has completed the blood thinner regimen, then take a Baby 81 mg Aspirin daily for three more weeks.   traMADol 50 MG tablet Commonly known as:  ULTRAM Take 1-2 tablets (50-100 mg total) by mouth every 6 (six) hours as needed for moderate pain.   zafirlukast 20 MG tablet Commonly known as:  ACCOLATE Take 1 tablet (20 mg total) by mouth 2 (two) times daily.            Discharge Care Instructions        Start     Ordered   01/06/17 0000  Weight bearing as tolerated    Question Answer Comment  Laterality right   Extremity Lower      01/06/17 0738   01/06/17 0000  Change dressing    Comments:  Change dressing daily with sterile 4 x 4 inch gauze dressing and apply TED hose. Do not submerge the incision under water.   01/06/17 5993     Follow-up Information    Gaynelle Arabian, MD. Schedule an appointment as soon as possible for a visit on 01/17/2017.   Specialty:  Orthopedic Surgery Contact information: [address] [place] [address] [phone]           Signed: Arlee Muslim, PA-C Orthopaedic Surgery 01/06/2017, 7:40 AM

## 2017-01-06 NOTE — Progress Notes (Signed)
RN reviewed discharge instructions with patient and family. All questions answered.   Paperwork and prescriptions given.   NT rolled patient down with all belongings to family car. 

## 2017-01-06 NOTE — Progress Notes (Signed)
Physical Therapy Treatment Patient Details Name: Gabriella Nguyen MRN: [id number] DOB: [birth year] Today's Date: 01/06/2017    History of Present Illness s/p R TKR revision.      PT Comments    POD # 2 am session Applied KI and instructed on use for stairs.  Instructed sister on proper application.  Had sister assist pt OOB to amb a greater distance in hallway.  Practiced stairs twice.  Pt given HEP TKR TE's.  Pt ready for D/C to home.   Follow Up Recommendations  Home health PT     Equipment Recommendations  None recommended by PT    Recommendations for Other Services       Precautions / Restrictions Precautions Precautions: Knee;Fall Precaution Comments: instructed to wear KI for stairs Required Braces or Orthoses: Knee Immobilizer - Right Knee Immobilizer - Right: Discontinue once straight leg raise with < 10 degree lag Restrictions Weight Bearing Restrictions: No Other Position/Activity Restrictions: WBAT    Mobility  Bed Mobility         Supine to sit: Min assist Sit to supine: Min assist   General bed mobility comments: OOB in recliner  Transfers Overall transfer level: Needs assistance Equipment used: Rolling walker (2 wheeled) Transfers: Sit to/from Stand Sit to Stand: Min assist;Min guard         General transfer comment: <25% VC's safety with turns  Ambulation/Gait Ambulation/Gait assistance: Supervision;Min guard Ambulation Distance (Feet): 142 Feet Assistive device: Rolling walker (2 wheeled) Gait Pattern/deviations: Step-to pattern;Decreased step length - right;Decreased step length - left;Shuffle;Trunk flexed Gait velocity: decreased   General Gait Details: <25% cues for sequence, posture and position from RW   Stairs Stairs: Yes   Stair Management: No rails;Step to pattern;Backwards;With walker Number of Stairs: 2 General stair comments: with sister present for "hands on" instruction, performed twice with 50% VC's on proper  tech/safety/walker placement  Wheelchair Mobility    Modified Rankin (Stroke Patients Only)       Balance                                            Cognition Arousal/Alertness: Awake/alert Behavior During Therapy: WFL for tasks assessed/performed Overall Cognitive Status: Within Functional Limits for tasks assessed                                        Exercises      General Comments        Pertinent Vitals/Pain Pain Assessment: 0-10 Pain Score: 4  Pain Location: R knee Pain Descriptors / Indicators: Aching;Sore;Operative site guarding Pain Intervention(s): Monitored during session;Repositioned;Ice applied    Home Living                      Prior Function            PT Goals (current goals can now be found in the care plan section) Progress towards PT goals: Progressing toward goals    Frequency           PT Plan Current plan remains appropriate    Co-evaluation              AM-PAC PT "6 Clicks" Daily Activity  Outcome Measure  Difficulty turning over in bed (including adjusting bedclothes, sheets and blankets)?: A Lot  Difficulty moving from lying on back to sitting on the side of the bed? : A Lot Difficulty sitting down on and standing up from a chair with arms (e.g., wheelchair, bedside commode, etc,.)?: A Lot Help needed moving to and from a bed to chair (including a wheelchair)?: A Little Help needed walking in hospital room?: A Little Help needed climbing 3-5 steps with a railing? : A Little 6 Click Score: 15    End of Session Equipment Utilized During Treatment: Gait belt;Right knee immobilizer Activity Tolerance: Patient tolerated treatment well Patient left: in bed;with call bell/phone within reach Nurse Communication: Mobility status PT Visit Diagnosis: Difficulty in walking, not elsewhere classified (R26.2)     Time: 1023-1050 PT Time Calculation (min) (ACUTE ONLY): 27  min  Charges:  $Gait Training: 8-22 mins $Therapeutic Activity: 8-22 mins                    G Codes:       Felecia Shelling  PTA [facility]  Acute  Rehab Pager      [phone]

## 2017-01-06 NOTE — Progress Notes (Signed)
Occupational Therapy Treatment Patient Details Name: Gabriella Nguyen MRN: [id number] DOB: [birth year] Today's Date: 01/06/2017    History of present illness s/p R TKR revision.     OT comments  All education completed this session  Follow Up Recommendations  Supervision/Assistance - 24 hour    Equipment Recommendations  None recommended by OT    Recommendations for Other Services      Precautions / Restrictions Precautions Precautions: Knee;Fall Required Braces or Orthoses: Knee Immobilizer - Right Knee Immobilizer - Right: Discontinue once straight leg raise with < 10 degree lag Restrictions Other Position/Activity Restrictions: WBAT       Mobility Bed Mobility         Supine to sit: Min assist Sit to supine: Min assist   General bed mobility comments: assist for RLE  Transfers   Equipment used: Rolling walker (2 wheeled)   Sit to Stand: Min assist;Min guard         General transfer comment: min A from bed; min guard from 3:1. Cues for UE placement    Balance                                           ADL either performed or assessed with clinical judgement   ADL                           Toilet Transfer: Minimal assistance;BSC;RW;Ambulation   Toileting- Architect and Hygiene: Min guard;Sit to/from stand   Tub/ Shower Transfer: Walk-in shower;Min guard;Ambulation     General ADL Comments: pt needed min A to stand from bed; min guard from 3:1 using 2 grab bars. Practiced shower transfer; pt held onto 2 grab bars:  encouraged her to use RW as she only has one at home. She did not want to practice a second time     Vision       Perception     Praxis      Cognition Arousal/Alertness: Awake/alert Behavior During Therapy: WFL for tasks assessed/performed Overall Cognitive Status: Within Functional Limits for tasks assessed                                          Exercises      Shoulder Instructions       General Comments      Pertinent Vitals/ Pain       Pain Score: 4  Pain Location: R knee Pain Descriptors / Indicators: Aching;Sore Pain Intervention(s): Limited activity within patient's tolerance;Monitored during session;Premedicated before session;Repositioned;Ice applied  Home Living                                          Prior Functioning/Environment              Frequency  Min 2X/week        Progress Toward Goals  OT Goals(current goals can now be found in the care plan section)  Progress towards OT goals: Progressing toward goals     Plan      Co-evaluation                 AM-PAC PT "6  Clicks" Daily Activity     Outcome Measure   Help from another person eating meals?: None Help from another person taking care of personal grooming?: A Little Help from another person toileting, which includes using toliet, bedpan, or urinal?: A Little Help from another person bathing (including washing, rinsing, drying)?: A Lot Help from another person to put on and taking off regular upper body clothing?: A Little Help from another person to put on and taking off regular lower body clothing?: A Lot 6 Click Score: 17    End of Session    OT Visit Diagnosis: Pain Pain - Right/Left: Right Pain - part of body: Knee   Activity Tolerance Patient tolerated treatment well   Patient Left in chair;with call bell/phone within reach;with family/visitor present   Nurse Communication          Time: 1610-9604 OT Time Calculation (min): 22 min  Charges: OT General Charges $OT Visit: 1 Visit OT Treatments $Self Care/Home Management : 8-22 mins  Marica Otter, OTR/L [phone] 01/06/2017   Khaleesi Gruel 01/06/2017, 10:36 AM

## 2017-01-23 ENCOUNTER — Other Ambulatory Visit: Payer: Self-pay

## 2017-01-23 MED ORDER — ZAFIRLUKAST 20 MG PO TABS: 20.0000 mg | ORAL_TABLET | Freq: Two times a day (BID) | ORAL | 0 refills | Status: DC

## 2017-02-07 ENCOUNTER — Encounter (HOSPITAL_COMMUNITY): Payer: Self-pay | Admitting: Orthopedic Surgery

## 2017-05-03 ENCOUNTER — Ambulatory Visit (INDEPENDENT_AMBULATORY_CARE_PROVIDER_SITE_OTHER): Payer: Non-veteran care | Admitting: Podiatry

## 2017-05-03 ENCOUNTER — Encounter: Payer: Self-pay | Admitting: Podiatry

## 2017-05-03 VITALS — BP 128/76 | HR 75 | Ht 72.0 in | Wt 227.0 lb

## 2017-05-03 DIAGNOSIS — M216X1 Other acquired deformities of right foot: Secondary | ICD-10-CM

## 2017-05-03 DIAGNOSIS — M2170 Unequal limb length (acquired), unspecified site: Secondary | ICD-10-CM | POA: Diagnosis not present

## 2017-05-03 DIAGNOSIS — R262 Difficulty in walking, not elsewhere classified: Secondary | ICD-10-CM

## 2017-05-03 DIAGNOSIS — M21969 Unspecified acquired deformity of unspecified lower leg: Secondary | ICD-10-CM

## 2017-05-03 DIAGNOSIS — M216X2 Other acquired deformities of left foot: Secondary | ICD-10-CM

## 2017-05-03 NOTE — Patient Instructions (Signed)
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 Noted of 1/2" short on left lower limb, tight Achilles tenon on right, severe Hallux valgus with bunion, hypermobile first ray bilateral, and flexible flat foot bilateral. Shoe liner dispensed to accommodate short left lower limb. May benefit from Physical therapy to stretch tight Achilles tendon on right. Will do X-ray and submit the report. Return as needed.

## 2017-05-03 NOTE — Progress Notes (Signed)
SUBJECTIVE: 62 y.o. year old female presents stating that she need a added shoe liner to accommodate her short left lower limb. Patient relates history of right knee replacement, which was 2nd surgery on 01/04/17 and left one is scheduled in 07/10/17. Stated that her left knee has lost cartilage and resulted in short left lower limb that is giving her difficult time during ambulation. [facility]  Review of Systems  Constitutional: Negative.   HENT: Negative.   Eyes: Negative.   Respiratory: Negative.   Cardiovascular: Negative.   Gastrointestinal: Negative.   Genitourinary: Negative.   Musculoskeletal: Positive for joint pain. Negative for back pain and neck pain.       Right knee surgery 2018and scheduled left knee surgery in April 2019.  Skin: Negative.     OBJECTIVE: DERMATOLOGIC EXAMINATION: Normal findings.  VASCULAR EXAMINATION OF LOWER LIMBS: All pedal pulses are palpable with normal pulsation.  Capillary Filling times within 3 seconds in all digits.  No visible edema or erythema noted. Temperature gradient from tibial crest to dorsum of foot is within normal bilateral.  NEUROLOGIC EXAMINATION OF THE LOWER LIMBS: Achilles DTR is present and within normal. Monofilament (Semmes-Weinstein 10-gm) sensory testing positive 6 out of 6, bilateral. Vibratory sensations(128Hz  turning fork) intact at medial and lateral forefoot bilateral.  Sharp and Dull discriminatory sensations at the plantar ball of hallux is intact bilateral.   MUSCULOSKELETAL EXAMINATION: Positive for short left lower limb 1/2" Severe hypermobile first ray with Hallux valgus and bunion deformity bilateral. Increased STJ hyperpronation bilateral.  RADIOGRAPHIC STUDIES:  AP View:  Adducted metatarsal bone bilateral. Short first metatarsal (-4 on right and left). Fibular sesamoid at 6 bilateral. Spurring of medial eminence of the first metatsrsal head left foot. Increased first  intermetatarsal angles bilateral. Increased hallux valgus bilateral L>R. Increased medial eminence of the first metatarsal head bilateral. Lateral view:  Pronated STJ with anterior advancement of Talar head with midtarsal sagging bilateral. Severe first metatarsal sagittal plane elevation right, extreme displacement on left. Positive of dorsal lipping at Navicular cuneiform joint left. Positive of plantar and posterior calcaneal spur bilateral.   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 Severe hallux valgus with bunion deformity. Hypermobile first ray bilateral with excess dorsal displacement of the first ray on left foot. STJ hyperpronation bilateral.  PLAN: Reviewed findings and available treatment options. OTC Orthotic full length dispensed to place in left side only to accommodate short lower limb. After her left knee surgery she may use them in both shoes.

## 2017-05-05 ENCOUNTER — Telehealth: Payer: Self-pay | Admitting: *Deleted

## 2017-05-05 ENCOUNTER — Encounter: Payer: Self-pay | Admitting: *Deleted

## 2017-05-05 NOTE — Telephone Encounter (Signed)
Request form and office notes faxed for physical therapy  to the [facility] [phone].

## 2017-05-05 NOTE — Telephone Encounter (Signed)
Patient called and states she needs a note on letter head stating that [facility] into the [facility]. Letter printed and mailed to the patient's home address per her request.

## 2017-05-17 ENCOUNTER — Telehealth: Payer: Self-pay | Admitting: *Deleted

## 2017-05-17 NOTE — Telephone Encounter (Signed)
We sent an authorization to the [facility]  for the patient to have physical therapy to stretcht he achilles tendon on the right side. [facility] called to let us know that the patient is already seeing a physical therapist within the [facility] for then achilles tendon on the  right side. This is an Financial planner[employer]I

## 2017-05-18 NOTE — Telephone Encounter (Signed)
Ok. Thanks!

## 2017-05-21 ENCOUNTER — Ambulatory Visit: Payer: Self-pay | Admitting: Orthopedic Surgery

## 2017-07-04 ENCOUNTER — Other Ambulatory Visit (HOSPITAL_COMMUNITY): Payer: Self-pay | Admitting: Emergency Medicine

## 2017-07-04 NOTE — Progress Notes (Signed)
EKG 12-28-16 epic

## 2017-07-04 NOTE — Patient Instructions (Addendum)
Gabriella CarnesRoberta W Nguyen  07/04/2017   Your procedure is scheduled on: 07-10-17    [facility]    Report to admitting at 6:00AM    Call this number if you have problems the morning of surgery [phone]     Remember: Do not eat food or drink liquids :After Midnight.      Take these medicines the morning of surgery with A SIP OF WATER: carvedilol, inhalers if needed, tylenol if needed                                You may not have any metal on your body including hair pins and              piercings  Do not wear jewelry, make-up, lotions, powders or perfumes, deodorant             Do not wear nail polish.  Do not shave  48 hours prior to surgery.      Do not bring valuables to the hospital. [place] IS NOT             RESPONSIBLE   FOR VALUABLES.  Contacts, dentures or bridgework may not be worn into surgery.  Leave suitcase in the car. After surgery it may be brought to your room.     Patients discharged the day of surgery will not be allowed to drive home.  Name and phone number of your driver:  Special Instructions: N/A              Please read over the following fact sheets you were given: _____________________________________________________________________             [facility] - Preparing for Surgery Before surgery, you can play an important role.  Because skin is not sterile, your skin needs to be as free of germs as possible.  You can reduce the number of germs on your skin by washing with CHG (chlorahexidine gluconate) soap before surgery.  CHG is an antiseptic cleaner which kills germs and bonds with the skin to continue killing germs even after washing. Please DO NOT use if you have an allergy to CHG or antibacterial soaps.  If your skin becomes reddened/irritated stop using the CHG and inform your nurse when you arrive at Short Stay. Do not shave (including legs and underarms) for at least 48 hours prior to the first  CHG shower.  You may shave your face/neck. Please follow these instructions carefully:  1.  Shower with CHG Soap the night before surgery and the  morning of Surgery.  2.  If you choose to wash your hair, wash your hair first as usual with your  normal  shampoo.  3.  After you shampoo, rinse your hair and body thoroughly to remove the  shampoo.                           4.  Use CHG as you would any other liquid soap.  You can apply chg directly  to the skin and wash                       Gently with a scrungie or clean washcloth.  5.  Apply the CHG Soap to your body ONLY  FROM THE NECK DOWN.   Do not use on face/ open                           Wound or open sores. Avoid contact with eyes, ears mouth and genitals (private parts).                       Wash face,  Genitals (private parts) with your normal soap.             6.  Wash thoroughly, paying special attention to the area where your surgery  will be performed.  7.  Thoroughly rinse your body with warm water from the neck down.  8.  DO NOT shower/wash with your normal soap after using and rinsing off  the CHG Soap.                9.  Pat yourself dry with a clean towel.            10.  Wear clean pajamas.            11.  Place clean sheets on your bed the night of your first shower and do not  sleep with pets. Day of Surgery : Do not apply any lotions/deodorants the morning of surgery.  Please wear clean clothes to the hospital/surgery center.  FAILURE TO FOLLOW THESE INSTRUCTIONS MAY RESULT IN THE CANCELLATION OF YOUR SURGERY PATIENT SIGNATURE_________________________________  NURSE SIGNATURE__________________________________  ________________________________________________________________________   Adam Phenix  An incentive spirometer is a tool that can help keep your lungs clear and active. This tool measures how well you are filling your lungs with each breath. Taking long deep breaths may help reverse or decrease the  chance of developing breathing (pulmonary) problems (especially infection) following:  A long period of time when you are unable to move or be active. BEFORE THE PROCEDURE   If the spirometer includes an indicator to show your best effort, your nurse or respiratory therapist will set it to a desired goal.  If possible, sit up straight or lean slightly forward. Try not to slouch.  Hold the incentive spirometer in an upright position. INSTRUCTIONS FOR USE  1. Sit on the edge of your bed if possible, or sit up as far as you can in bed or on a chair. 2. Hold the incentive spirometer in an upright position. 3. Breathe out normally. 4. Place the mouthpiece in your mouth and seal your lips tightly around it. 5. Breathe in slowly and as deeply as possible, raising the piston or the ball toward the top of the column. 6. Hold your breath for 3-5 seconds or for as long as possible. Allow the piston or ball to fall to the bottom of the column. 7. Remove the mouthpiece from your mouth and breathe out normally. 8. Rest for a few seconds and repeat Steps 1 through 7 at least 10 times every 1-2 hours when you are awake. Take your time and take a few normal breaths between deep breaths. 9. The spirometer may include an indicator to show your best effort. Use the indicator as a goal to work toward during each repetition. 10. After each set of 10 deep breaths, practice coughing to be sure your lungs are clear. If you have an incision (the cut made at the time of surgery), support your incision when coughing by placing a pillow or rolled up towels firmly against it. Once you  are able to get out of bed, walk around indoors and cough well. You may stop using the incentive spirometer when instructed by your caregiver.  RISKS AND COMPLICATIONS  Take your time so you do not get dizzy or light-headed.  If you are in pain, you may need to take or ask for pain medication before doing incentive spirometry. It is harder  to take a deep breath if you are having pain. AFTER USE  Rest and breathe slowly and easily.  It can be helpful to keep track of a log of your progress. Your caregiver can provide you with a simple table to help with this. If you are using the spirometer at home, follow these instructions: [place] IF:   You are having difficultly using the spirometer.  You have trouble using the spirometer as often as instructed.  Your pain medication is not giving enough relief while using the spirometer.  You develop fever of 100.5 F (38.1 C) or higher. SEEK IMMEDIATE MEDICAL CARE IF:   You cough up bloody sputum that had not been present before.  You develop fever of 102 F (38.9 C) or greater.  You develop worsening pain at or near the incision site. MAKE SURE YOU:   Understand these instructions.  Will watch your condition.  Will get help right away if you are not doing well or get worse. Document Released: 07/25/2006 Document Revised: 06/06/2011 Document Reviewed: 09/25/2006 Exit[employer] Patient Information 2014 ExitCare, [place].   ________________________________________________________________________  WHAT IS A BLOOD TRANSFUSION? Blood Transfusion Information  A transfusion is the replacement of blood or some of its parts. Blood is made up of multiple cells which provide different functions.  Red blood cells carry oxygen and are used for blood loss replacement.  White blood cells fight against infection.  Platelets control bleeding.  Plasma helps clot blood.  Other blood products are available for specialized needs, such as hemophilia or other clotting disorders. BEFORE THE TRANSFUSION  Who gives blood for transfusions?   Healthy volunteers who are fully evaluated to make sure their blood is safe. This is blood bank blood. Transfusion therapy is the safest it has ever been in the practice of medicine. Before blood is taken from a donor, a complete history is taken  to make sure that person has no history of diseases nor engages in risky social behavior (examples are intravenous drug use or sexual activity with multiple partners). The donor's travel history is screened to minimize risk of transmitting infections, such as malaria. The donated blood is tested for signs of infectious diseases, such as HIV and hepatitis. The blood is then tested to be sure it is compatible with you in order to minimize the chance of a transfusion reaction. If you or a relative donates blood, this is often done in anticipation of surgery and is not appropriate for emergency situations. It takes many days to process the donated blood. RISKS AND COMPLICATIONS Although transfusion therapy is very safe and saves many lives, the main dangers of transfusion include:   Getting an infectious disease.  Developing a transfusion reaction. This is an allergic reaction to something in the blood you were given. Every precaution is taken to prevent this. The decision to have a blood transfusion has been considered carefully by your caregiver before blood is given. Blood is not given unless the benefits outweigh the risks. AFTER THE TRANSFUSION  Right after receiving a blood transfusion, you will usually feel much better and more energetic. This is  especially true if your red blood cells have gotten low (anemic). The transfusion raises the level of the red blood cells which carry oxygen, and this usually causes an energy increase.  The nurse administering the transfusion will monitor you carefully for complications. HOME CARE INSTRUCTIONS  No special instructions are needed after a transfusion. You may find your energy is better. Speak with your caregiver about any limitations on activity for underlying diseases you may have. SEEK MEDICAL CARE IF:   Your condition is not improving after your transfusion.  You develop redness or irritation at the intravenous (IV) site. SEEK IMMEDIATE MEDICAL CARE  IF:  Any of the following symptoms occur over the next 12 hours:  Shaking chills.  You have a temperature by mouth above 102 F (38.9 C), not controlled by medicine.  Chest, back, or muscle pain.  People around you feel you are not acting correctly or are confused.  Shortness of breath or difficulty breathing.  Dizziness and fainting.  You get a rash or develop hives.  You have a decrease in urine output.  Your urine turns a dark color or changes to pink, red, or brown. Any of the following symptoms occur over the next 10 days:  You have a temperature by mouth above 102 F (38.9 C), not controlled by medicine.  Shortness of breath.  Weakness after normal activity.  The white part of the eye turns yellow (jaundice).  You have a decrease in the amount of urine or are urinating less often.  Your urine turns a dark color or changes to pink, red, or brown. Document Released: 03/11/2000 Document Revised: 06/06/2011 Document Reviewed: 10/29/2007 [facility] Patient Information 2014 [place], [place].  _______________________________________________________________________

## 2017-07-05 ENCOUNTER — Encounter (HOSPITAL_COMMUNITY): Payer: Self-pay

## 2017-07-05 ENCOUNTER — Other Ambulatory Visit: Payer: Self-pay

## 2017-07-05 ENCOUNTER — Encounter (HOSPITAL_COMMUNITY)
Admission: RE | Admit: 2017-07-05 | Discharge: 2017-07-05 | Disposition: A | Discharge status: Home / Self Care | Payer: Non-veteran care | Source: Ambulatory Visit | Attending: Orthopedic Surgery | Admitting: Orthopedic Surgery

## 2017-07-05 DIAGNOSIS — Z01812 Encounter for preprocedural laboratory examination: Secondary | ICD-10-CM | POA: Insufficient documentation

## 2017-07-05 LAB — PROTIME-INR
INR: 1.01
PROTHROMBIN TIME: 13.2 s (ref 11.4–15.2)

## 2017-07-05 LAB — COMPREHENSIVE METABOLIC PANEL
ALT: 12 U/L — AB (ref 14–54)
AST: 19 U/L (ref 15–41)
Albumin: 3.8 g/dL (ref 3.5–5.0)
Alkaline Phosphatase: 63 U/L (ref 38–126)
Anion gap: 8 (ref 5–15)
BUN: 12 mg/dL (ref 6–20)
CHLORIDE: 102 mmol/L (ref 101–111)
CO2: 28 mmol/L (ref 22–32)
CREATININE: 0.62 mg/dL (ref 0.44–1.00)
Calcium: 9.4 mg/dL (ref 8.9–10.3)
Glucose, Bld: 92 mg/dL (ref 65–99)
Potassium: 3.6 mmol/L (ref 3.5–5.1)
Sodium: 138 mmol/L (ref 135–145)
Total Bilirubin: 0.9 mg/dL (ref 0.3–1.2)
Total Protein: 6.8 g/dL (ref 6.5–8.1)

## 2017-07-05 LAB — SURGICAL PCR SCREEN
MRSA, PCR: NEGATIVE
Staphylococcus aureus: NEGATIVE

## 2017-07-05 LAB — CBC
HCT: 38.5 % (ref 36.0–46.0)
Hemoglobin: 13.1 g/dL (ref 12.0–15.0)
MCH: 31.4 pg (ref 26.0–34.0)
MCHC: 34 g/dL (ref 30.0–36.0)
MCV: 92.3 fL (ref 78.0–100.0)
PLATELETS: 244 10*3/uL (ref 150–400)
RBC: 4.17 MIL/uL (ref 3.87–5.11)
RDW: 13 % (ref 11.5–15.5)
WBC: 5.7 10*3/uL (ref 4.0–10.5)

## 2017-07-05 LAB — APTT: aPTT: 32 seconds (ref 24–36)

## 2017-07-09 ENCOUNTER — Ambulatory Visit: Payer: Self-pay | Admitting: Orthopedic Surgery

## 2017-07-09 MED ORDER — BUPIVACAINE LIPOSOME 1.3 % IJ SUSP
20.0000 mL | INTRAMUSCULAR | Status: DC
  Filled 2017-07-09: qty 20

## 2017-07-09 NOTE — H&P (Signed)
Gabriella Nguyen, Gabriella Nguyen (61yo, F)  DOB 09-May-1955   Chief Complaint Left Knee Pain H&P left TKa 07-10-2017  Patient's Care Team Primary Care Provider: Daisy FloroANTOINETTE WYMER MD: [address], [place], [place] [address], Ph [phone]) [phone], Fax [phone] NPI: [phone] Patient's Pharmacies [facility]: [address], [place] [id number], Ph [phone], Fax [phone]) [phone]  Vitals Ht: 6 ft  Wt: 230 lbs  BMI: 31.2  BP: 122/72 Pulse: 76 bpm   Allergies Reviewed Allergies NIFEDIPINE: - swelling in mouth and gums   Medications Reviewed Medications aspirin 81 mg tablet,delayed release Take 1 tablet(s) every day by oral route. 04/18/17   entered Faith Brown losartan 04/18/17   entered Faith Brown methocarbamol 04/18/17   entered Faith Brown naproxen 04/18/17   entered Kieth BrightlyFaith Brown ProAir HFA 04/18/17   entered Faith Brown spironolactone 06/13/17   entered ALEXZANDREW PERKINS, PA-C Symbicort 04/18/17   entered Kieth BrightlyFaith Brown Tylenol Extra Strength 04/18/17   entered Kieth BrightlyFaith Brown Vitamin D 04/18/17   entered Kieth BrightlyFaith Brown            Problems Reviewed Problems Osteoarthritis of knee -  Family History Reviewed Family History  Social History Reviewed Social History Smoking Status: Former smoker Non-smoker Chewing tobacco: none Alcohol intake: Occasional Hand Dominance: Right Work related injury?: N Advance directive: N Medical Power of Attorney: N  Surgical History Reviewed Surgical History Knee Joint Replacement - 03/29/2015 Knee Surgery - 03/29/2015  Past Medical History Reviewed Past Medical History Asthma: Y Hypertension: Y Joint Pain: Y Swelling of Legs/Feet/Hands: Y   HPI The patient is here today for a pre-operative History and Physical. They are scheduled for left total knee replacement on 07-10-2017 with Dr. Lequita [facility]altAluisio at [facility]. Patient is now several months post op right total knee revision.  She reports the knee is doing well. She is only occasionally taking Tylenol or Naproxen. She is continuing with her home exercise program. She states that the right knee has now become her "good knee". Unfortunately, she also has a LEFT knee which is very problematic. She has been seen for this knee also. She has documented endstage arthritis of the LEFT knee and is at a stage where she is ready to go ahead and get this one fixed. Her revision RIGHT knee replacement is doing great at this time. Significant improvement in pain relief, stability and function. She is very pleased with his knee. She has advanced end-stage arthritis of the LEFT knee with progressive pain and dysfunction. She has had injections in the past without benefit. She was going to have it done at the [facility] a few years ago but instead had all the issues with the RIGHT knee. She is ready to go ahead and get the left knee replaced at this time.  ROS Constitutional: Constitutional: no fever, chills, night sweats, or significant weight loss.  Cardiovascular: Cardiovascular: no palpitations or chest pain.  Respiratory: Respiratory: no shortness of breath, cough, and No COPD.  Gastrointestinal: Gastrointestinal: no vomiting or nausea.  Musculoskeletal: Musculoskeletal: no swelling in Joints and Joint Pain.  Neurologic: Neurologic: no numbness, tingling, or difficulty with balance.  Physical Exam Patient is a 62 year old female.  General Mental Status - Alert, cooperative and good historian. General Appearance - pleasant, Not in acute distress. Orientation - Oriented X3. Build & Nutrition - Well nourished and Well developed.  Head and Neck Upper partial denture plate Head - normocephalic, atraumatic . Neck Global Assessment - supple, no bruit auscultated on the  right, no bruit auscultated on the left.  Eye Wears glasses Pupil - Bilateral - PERR Motion - Bilateral - EOMI.  Chest and Lung Exam Auscultation Breath sounds  - clear at anterior chest wall and clear at posterior chest wall. Adventitious sounds - No Adventitious sounds.  Cardiovascular Auscultation Rhythm - Regular rate and rhythm. Heart Sounds - S1 WNL and S2 WNL. Murmurs & Other Heart Sounds - Auscultation of the heart reveals - No Murmurs.  Abdomen Palpation/Percussion Tenderness - Abdomen is non-tender to palpation. Abdomen is soft. Auscultation Auscultation of the abdomen reveals - Bowel sounds normal.  Genitourinary Note: Not done, not pertinent to present illness  Musculoskeletal RIGHT knee shows minimal swelling. Her range of motion is 0-125. There is no tenderness or instability about the RIGHT knee. LEFT knee shows slight varus range of motion 5-120 crepitus on range of motion tenderness diffusely and no instability. Her gait pattern significant antalgic on the LEFT.  Assessment / Plan 1. Osteoarthritis of knee M17.11: Unilateral primary osteoarthritis, right knee  Patient Instructions Surgical Plans: Left Total Knee Replacement Disposition: Home, [place] to Outpatient [facility] PCP: [facility]. Christella Sche[facility] Anesthesia Issues: None Patient was instructed on what medications to stop prior to surgery. - Follow up visit in 2 weeks with Dr. Lequita Halt - Begin physical therapy following surgery - Pre-operative lab work as pre Pre-Surgical Testing - Prescriptions will be provided in hospital at time of discharge  Anticipated LOS equal to or greater than 2 midnights due to - Age 73 and older with one or more of the following:  - Obesity  - Expected need for hospital services (PT, OT, Nursing) required for safe  discharge  - Anticipated need for postoperative skilled nursing care or inpatient rehab  - Active co-morbidities: HTN  Return to Office Ollen Gross, MD for [facility] on 07/25/2017 at 01:00 PM Encounter signed-off by Patrica Duel, PA-C

## 2017-07-09 NOTE — H&P (View-Only) (Signed)
Gabriella Nguyen, Gabriella Nguyen (61yo, F)  DOB 09-May-1955   Chief Complaint Left Knee Pain H&P left TKa 07-10-2017  Patient's Care Team Primary Care Provider: Daisy FloroANTOINETTE WYMER MD: [address], [place], [place] [address], Ph [phone]) [phone], Fax [phone] NPI: [phone] Patient's Pharmacies [facility]: [address], [place] [id number], Ph [phone], Fax [phone]  Vitals Ht: 6 ft  Wt: 230 lbs  BMI: 31.2  BP: 122/72 Pulse: 76 bpm   Allergies Reviewed Allergies NIFEDIPINE: - swelling in mouth and gums   Medications Reviewed Medications aspirin 81 mg tablet,delayed release Take 1 tablet(s) every day by oral route. 04/18/17   entered Faith Brown losartan 04/18/17   entered Faith Brown methocarbamol 04/18/17   entered Faith Brown naproxen 04/18/17   entered Kieth BrightlyFaith Brown ProAir HFA 04/18/17   entered Faith Brown spironolactone 06/13/17   entered ALEXZANDREW PERKINS, PA-C Symbicort 04/18/17   entered Kieth BrightlyFaith Brown Tylenol Extra Strength 04/18/17   entered Kieth BrightlyFaith Brown Vitamin D 04/18/17   entered Kieth BrightlyFaith Brown            Problems Reviewed Problems Osteoarthritis of knee -  Family History Reviewed Family History  Social History Reviewed Social History Smoking Status: Former smoker Non-smoker Chewing tobacco: none Alcohol intake: Occasional Hand Dominance: Right Work related injury?: N Advance directive: N Medical Power of Attorney: N  Surgical History Reviewed Surgical History Knee Joint Replacement - 03/29/2015 Knee Surgery - 03/29/2015  Past Medical History Reviewed Past Medical History Asthma: Y Hypertension: Y Joint Pain: Y Swelling of Legs/Feet/Hands: Y   HPI The patient is here today for a pre-operative History and Physical. They are scheduled for left total knee replacement on 07-10-2017 with Dr. Lequ[facility]altAluisio at [facility]. Patient is now several months post op right total knee revision.  She reports the knee is doing well. She is only occasionally taking Tylenol or Naproxen. She is continuing with her home exercise program. She states that the right knee has now become her "good knee". Unfortunately, she also has a LEFT knee which is very problematic. She has been seen for this knee also. She has documented endstage arthritis of the LEFT knee and is at a stage where she is ready to go ahead and get this one fixed. Her revision RIGHT knee replacement is doing great at this time. Significant improvement in pain relief, stability and function. She is very pleased with his knee. She has advanced end-stage arthritis of the LEFT knee with progressive pain and dysfunction. She has had injections in the past without benefit. She was going to have it done at the [facility] a few years ago but instead had all the issues with the RIGHT knee. She is ready to go ahead and get the left knee replaced at this time.  ROS Constitutional: Constitutional: no fever, chills, night sweats, or significant weight loss.  Cardiovascular: Cardiovascular: no palpitations or chest pain.  Respiratory: Respiratory: no shortness of breath, cough, and No COPD.  Gastrointestinal: Gastrointestinal: no vomiting or nausea.  Musculoskeletal: Musculoskeletal: no swelling in Joints and Joint Pain.  Neurologic: Neurologic: no numbness, tingling, or difficulty with balance.  Physical Exam Patient is a 62 year old female.  General Mental Status - Alert, cooperative and good historian. General Appearance - pleasant, Not in acute distress. Orientation - Oriented X3. Build & Nutrition - Well nourished and Well developed.  Head and Neck Upper partial denture plate Head - normocephalic, atraumatic . Neck Global Assessment - supple, no bruit auscultated on the  right, no bruit auscultated on the left.  Eye Wears glasses Pupil - Bilateral - PERR Motion - Bilateral - EOMI.  Chest and Lung Exam Auscultation Breath sounds  - clear at anterior chest wall and clear at posterior chest wall. Adventitious sounds - No Adventitious sounds.  Cardiovascular Auscultation Rhythm - Regular rate and rhythm. Heart Sounds - S1 WNL and S2 WNL. Murmurs & Other Heart Sounds - Auscultation of the heart reveals - No Murmurs.  Abdomen Palpation/Percussion Tenderness - Abdomen is non-tender to palpation. Abdomen is soft. Auscultation Auscultation of the abdomen reveals - Bowel sounds normal.  Genitourinary Note: Not done, not pertinent to present illness  Musculoskeletal RIGHT knee shows minimal swelling. Her range of motion is 0-125. There is no tenderness or instability about the RIGHT knee. LEFT knee shows slight varus range of motion 5-120 crepitus on range of motion tenderness diffusely and no instability. Her gait pattern significant antalgic on the LEFT.  Assessment / Plan 1. Osteoarthritis of knee M17.11: Unilateral primary osteoarthritis, right knee  Patient Instructions Surgical Plans: Left Total Knee Replacement Disposition: Home, [place] to [facility]patient [facility] PCP: [facility] Christella [facility] Anesthesia Issues: None Patient was instructed on what medications to stop prior to surgery. - Follow up visit in 2 weeks with Dr. Lequita Halt - Begin physical therapy following surgery - Pre-operative lab work as pre Pre-Surgical Testing - Prescriptions will be provided in hospital at time of discharge  Anticipated LOS equal to or greater than 2 midnights due to - Age 73 and older with one or more of the following:  - Obesity  - Expected need for hospital services (PT, OT, Nursing) required for safe  discharge  - Anticipated need for postoperative skilled nursing care or inpatient rehab  - Active co-morbidities: HTN  Return to Office Ollen Gross, MD for [facility] on 07/25/2017 at 01:00 PM Encounter signed-off by Patrica Duel, PA-C

## 2017-07-10 ENCOUNTER — Encounter (HOSPITAL_COMMUNITY): Payer: Self-pay | Admitting: Certified Registered"

## 2017-07-10 ENCOUNTER — Other Ambulatory Visit: Payer: Self-pay

## 2017-07-10 ENCOUNTER — Inpatient Hospital Stay (HOSPITAL_COMMUNITY): Payer: No Typology Code available for payment source | Admitting: Certified Registered"

## 2017-07-10 ENCOUNTER — Inpatient Hospital Stay (HOSPITAL_COMMUNITY)
Admission: RE | Admit: 2017-07-10 | Discharge: 2017-07-11 | DRG: 470 | Disposition: A | Discharge status: Home / Self Care | Payer: No Typology Code available for payment source | Source: Ambulatory Visit | Attending: Orthopedic Surgery | Admitting: Orthopedic Surgery

## 2017-07-10 ENCOUNTER — Encounter (HOSPITAL_COMMUNITY)
Admission: RE | Disposition: A | Discharge status: Home / Self Care | Payer: Self-pay | Source: Ambulatory Visit | Attending: Orthopedic Surgery

## 2017-07-10 DIAGNOSIS — Z87891 Personal history of nicotine dependence: Secondary | ICD-10-CM | POA: Diagnosis not present

## 2017-07-10 DIAGNOSIS — M1710 Unilateral primary osteoarthritis, unspecified knee: Secondary | ICD-10-CM

## 2017-07-10 DIAGNOSIS — Z96651 Presence of right artificial knee joint: Secondary | ICD-10-CM | POA: Diagnosis present

## 2017-07-10 DIAGNOSIS — M25562 Pain in left knee: Secondary | ICD-10-CM | POA: Diagnosis present

## 2017-07-10 DIAGNOSIS — M1712 Unilateral primary osteoarthritis, left knee: Principal | ICD-10-CM | POA: Diagnosis present

## 2017-07-10 DIAGNOSIS — M179 Osteoarthritis of knee, unspecified: Secondary | ICD-10-CM | POA: Diagnosis present

## 2017-07-10 DIAGNOSIS — I10 Essential (primary) hypertension: Secondary | ICD-10-CM | POA: Diagnosis present

## 2017-07-10 DIAGNOSIS — J45909 Unspecified asthma, uncomplicated: Secondary | ICD-10-CM | POA: Diagnosis present

## 2017-07-10 HISTORY — PX: TOTAL KNEE ARTHROPLASTY: SHX125

## 2017-07-10 LAB — TYPE AND SCREEN
ABO/RH(D): O POS
Antibody Screen: NEGATIVE

## 2017-07-10 SURGERY — ARTHROPLASTY, KNEE, TOTAL
Anesthesia: Monitor Anesthesia Care | Site: Knee | Laterality: Left

## 2017-07-10 MED ORDER — METHOCARBAMOL 500 MG PO TABS
500.0000 mg | ORAL_TABLET | Freq: Four times a day (QID) | ORAL | Status: DC | PRN
  Administered 2017-07-10 – 2017-07-11 (×3): 500 mg via ORAL
  Filled 2017-07-10 (×3): qty 1

## 2017-07-10 MED ORDER — BISACODYL 10 MG RE SUPP: 10.0000 mg | Freq: Every day | RECTAL | Status: DC | PRN

## 2017-07-10 MED ORDER — PHENYLEPHRINE 40 MCG/ML (10ML) SYRINGE FOR IV PUSH (FOR BLOOD PRESSURE SUPPORT)
PREFILLED_SYRINGE | INTRAVENOUS | Status: AC
  Filled 2017-07-10: qty 10

## 2017-07-10 MED ORDER — BUPIVACAINE IN DEXTROSE 0.75-8.25 % IT SOLN
INTRATHECAL | Status: DC | PRN
  Administered 2017-07-10: 2 mL via INTRATHECAL

## 2017-07-10 MED ORDER — ACETAMINOPHEN 10 MG/ML IV SOLN
1000.0000 mg | Freq: Once | INTRAVENOUS | Status: AC
  Administered 2017-07-10: 1000 mg via INTRAVENOUS
  Filled 2017-07-10: qty 100

## 2017-07-10 MED ORDER — ONDANSETRON HCL 4 MG/2ML IJ SOLN: 4.0000 mg | Freq: Four times a day (QID) | INTRAMUSCULAR | Status: DC | PRN

## 2017-07-10 MED ORDER — PHENOL 1.4 % MT LIQD: 1.0000 | OROMUCOSAL | Status: DC | PRN

## 2017-07-10 MED ORDER — LACTATED RINGERS IV SOLN
INTRAVENOUS | Status: DC
  Administered 2017-07-10: 07:00:00 via INTRAVENOUS

## 2017-07-10 MED ORDER — FENTANYL CITRATE (PF) 100 MCG/2ML IJ SOLN: 25.0000 ug | INTRAMUSCULAR | Status: DC | PRN

## 2017-07-10 MED ORDER — RIVAROXABAN 10 MG PO TABS
10.0000 mg | ORAL_TABLET | Freq: Every day | ORAL | Status: DC
  Administered 2017-07-11: 08:00:00 10 mg via ORAL
  Filled 2017-07-10: qty 1

## 2017-07-10 MED ORDER — PROPOFOL 500 MG/50ML IV EMUL
INTRAVENOUS | Status: DC | PRN
  Administered 2017-07-10: 75 ug/kg/min via INTRAVENOUS

## 2017-07-10 MED ORDER — ACETAMINOPHEN 325 MG PO TABS: 325.0000 mg | ORAL_TABLET | Freq: Four times a day (QID) | ORAL | Status: DC | PRN

## 2017-07-10 MED ORDER — PHENYLEPHRINE 40 MCG/ML (10ML) SYRINGE FOR IV PUSH (FOR BLOOD PRESSURE SUPPORT)
PREFILLED_SYRINGE | INTRAVENOUS | Status: DC | PRN
  Administered 2017-07-10 (×3): 80 ug via INTRAVENOUS

## 2017-07-10 MED ORDER — GABAPENTIN 300 MG PO CAPS
300.0000 mg | ORAL_CAPSULE | Freq: Three times a day (TID) | ORAL | Status: DC
  Administered 2017-07-10 – 2017-07-11 (×4): 300 mg via ORAL
  Filled 2017-07-10 (×4): qty 1

## 2017-07-10 MED ORDER — DEXAMETHASONE SODIUM PHOSPHATE 10 MG/ML IJ SOLN
10.0000 mg | Freq: Once | INTRAMUSCULAR | Status: AC
  Administered 2017-07-10: 10 mg via INTRAVENOUS

## 2017-07-10 MED ORDER — SODIUM CHLORIDE 0.9 % IJ SOLN
INTRAMUSCULAR | Status: AC
  Filled 2017-07-10: qty 50

## 2017-07-10 MED ORDER — HYDROCHLOROTHIAZIDE 25 MG PO TABS
25.0000 mg | ORAL_TABLET | Freq: Every day | ORAL | Status: DC
  Administered 2017-07-11: 25 mg via ORAL
  Filled 2017-07-10: qty 1

## 2017-07-10 MED ORDER — BUPIVACAINE LIPOSOME 1.3 % IJ SUSP
INTRAMUSCULAR | Status: DC | PRN
  Administered 2017-07-10: 20 mL

## 2017-07-10 MED ORDER — SPIRONOLACTONE-HCTZ 25-25 MG PO TABS: 1.0000 | ORAL_TABLET | Freq: Every day | ORAL | Status: DC

## 2017-07-10 MED ORDER — PROPOFOL 10 MG/ML IV BOLUS
INTRAVENOUS | Status: AC
  Filled 2017-07-10: qty 20

## 2017-07-10 MED ORDER — MOMETASONE FURO-FORMOTEROL FUM 200-5 MCG/ACT IN AERO
2.0000 | INHALATION_SPRAY | Freq: Two times a day (BID) | RESPIRATORY_TRACT | Status: DC
  Administered 2017-07-10 – 2017-07-11 (×2): 2 via RESPIRATORY_TRACT
  Filled 2017-07-10 (×2): qty 8.8

## 2017-07-10 MED ORDER — LACTATED RINGERS IV SOLN: INTRAVENOUS | Status: DC

## 2017-07-10 MED ORDER — TRANEXAMIC ACID 1000 MG/10ML IV SOLN
1000.0000 mg | Freq: Once | INTRAVENOUS | Status: AC
  Administered 2017-07-10: 13:00:00 1000 mg via INTRAVENOUS
  Filled 2017-07-10: qty 1100

## 2017-07-10 MED ORDER — ALBUTEROL SULFATE HFA 108 (90 BASE) MCG/ACT IN AERS
INHALATION_SPRAY | RESPIRATORY_TRACT | Status: AC
  Filled 2017-07-10: qty 6.7

## 2017-07-10 MED ORDER — CHLORHEXIDINE GLUCONATE 4 % EX LIQD: 60.0000 mL | Freq: Once | CUTANEOUS | Status: DC

## 2017-07-10 MED ORDER — CARVEDILOL 6.25 MG PO TABS
6.2500 mg | ORAL_TABLET | Freq: Two times a day (BID) | ORAL | Status: DC
  Administered 2017-07-10 – 2017-07-11 (×3): 6.25 mg via ORAL
  Filled 2017-07-10: qty 1
  Filled 2017-07-10: qty 2
  Filled 2017-07-10: qty 1

## 2017-07-10 MED ORDER — LACTATED RINGERS IV SOLN
INTRAVENOUS | Status: DC
  Administered 2017-07-10 (×3): via INTRAVENOUS

## 2017-07-10 MED ORDER — METOCLOPRAMIDE HCL 5 MG/ML IJ SOLN: 5.0000 mg | Freq: Three times a day (TID) | INTRAMUSCULAR | Status: DC | PRN

## 2017-07-10 MED ORDER — SODIUM CHLORIDE 0.9 % IV SOLN
INTRAVENOUS | Status: DC
  Administered 2017-07-10 – 2017-07-11 (×2): via INTRAVENOUS

## 2017-07-10 MED ORDER — MENTHOL 3 MG MT LOZG: 1.0000 | LOZENGE | OROMUCOSAL | Status: DC | PRN

## 2017-07-10 MED ORDER — METHOCARBAMOL 1000 MG/10ML IJ SOLN
500.0000 mg | Freq: Four times a day (QID) | INTRAVENOUS | Status: DC | PRN
  Filled 2017-07-10: qty 5

## 2017-07-10 MED ORDER — OXYCODONE HCL 5 MG PO TABS
5.0000 mg | ORAL_TABLET | ORAL | Status: DC | PRN
  Administered 2017-07-10: 10 mg via ORAL
  Administered 2017-07-10 – 2017-07-11 (×5): 5 mg via ORAL
  Filled 2017-07-10: qty 1
  Filled 2017-07-10: qty 2
  Filled 2017-07-10 (×4): qty 1

## 2017-07-10 MED ORDER — PROPOFOL 10 MG/ML IV BOLUS
INTRAVENOUS | Status: AC
  Filled 2017-07-10: qty 40

## 2017-07-10 MED ORDER — OXYCODONE HCL 5 MG PO TABS: 10.0000 mg | ORAL_TABLET | ORAL | Status: DC | PRN

## 2017-07-10 MED ORDER — MIDAZOLAM HCL 2 MG/2ML IJ SOLN
1.0000 mg | INTRAMUSCULAR | Status: DC
  Administered 2017-07-10: 2 mg via INTRAVENOUS
  Filled 2017-07-10: qty 2

## 2017-07-10 MED ORDER — LOSARTAN POTASSIUM 50 MG PO TABS
100.0000 mg | ORAL_TABLET | Freq: Every day | ORAL | Status: DC
  Administered 2017-07-11: 09:00:00 100 mg via ORAL
  Filled 2017-07-10: qty 2

## 2017-07-10 MED ORDER — FENTANYL CITRATE (PF) 100 MCG/2ML IJ SOLN
50.0000 ug | INTRAMUSCULAR | Status: DC
  Administered 2017-07-10: 100 ug via INTRAVENOUS
  Filled 2017-07-10: qty 2

## 2017-07-10 MED ORDER — HYDROMORPHONE HCL 1 MG/ML IJ SOLN
0.5000 mg | INTRAMUSCULAR | Status: DC | PRN
  Filled 2017-07-10: qty 1

## 2017-07-10 MED ORDER — ALBUTEROL SULFATE (2.5 MG/3ML) 0.083% IN NEBU: 2.5000 mg | INHALATION_SOLUTION | Freq: Four times a day (QID) | RESPIRATORY_TRACT | Status: DC | PRN

## 2017-07-10 MED ORDER — CEFAZOLIN SODIUM-DEXTROSE 2-4 GM/100ML-% IV SOLN
2.0000 g | Freq: Four times a day (QID) | INTRAVENOUS | Status: AC
  Administered 2017-07-10 (×2): 2 g via INTRAVENOUS
  Filled 2017-07-10 (×2): qty 100

## 2017-07-10 MED ORDER — TRAMADOL HCL 50 MG PO TABS
50.0000 mg | ORAL_TABLET | Freq: Four times a day (QID) | ORAL | Status: DC | PRN
Start: 2017-07-10 — End: 2017-07-11
  Administered 2017-07-11: 100 mg via ORAL
  Filled 2017-07-10: qty 2

## 2017-07-10 MED ORDER — SODIUM CHLORIDE 0.9 % IV SOLN
1000.0000 mg | INTRAVENOUS | Status: AC
Start: 2017-07-10 — End: 2017-07-10
  Administered 2017-07-10: 1000 mg via INTRAVENOUS
  Filled 2017-07-10: qty 1100

## 2017-07-10 MED ORDER — DEXAMETHASONE SODIUM PHOSPHATE 10 MG/ML IJ SOLN
10.0000 mg | Freq: Once | INTRAMUSCULAR | Status: AC
  Administered 2017-07-11: 10 mg via INTRAVENOUS
  Filled 2017-07-10: qty 1

## 2017-07-10 MED ORDER — ONDANSETRON HCL 4 MG PO TABS: 4.0000 mg | ORAL_TABLET | Freq: Four times a day (QID) | ORAL | Status: DC | PRN

## 2017-07-10 MED ORDER — METOCLOPRAMIDE HCL 5 MG/ML IJ SOLN: 10.0000 mg | Freq: Once | INTRAMUSCULAR | Status: DC | PRN

## 2017-07-10 MED ORDER — DOCUSATE SODIUM 100 MG PO CAPS
100.0000 mg | ORAL_CAPSULE | Freq: Two times a day (BID) | ORAL | Status: DC
Start: 2017-07-10 — End: 2017-07-11
  Administered 2017-07-10 – 2017-07-11 (×2): 100 mg via ORAL
  Filled 2017-07-10 (×2): qty 1

## 2017-07-10 MED ORDER — SODIUM CHLORIDE 0.9 % IJ SOLN
INTRAMUSCULAR | Status: DC | PRN
  Administered 2017-07-10: 60 mL

## 2017-07-10 MED ORDER — MIDAZOLAM HCL 2 MG/2ML IJ SOLN
INTRAMUSCULAR | Status: AC
  Filled 2017-07-10: qty 2

## 2017-07-10 MED ORDER — ROPIVACAINE HCL 7.5 MG/ML IJ SOLN
INTRAMUSCULAR | Status: DC | PRN
  Administered 2017-07-10: 20 mL via PERINEURAL

## 2017-07-10 MED ORDER — GABAPENTIN 300 MG PO CAPS
300.0000 mg | ORAL_CAPSULE | Freq: Once | ORAL | Status: AC
  Administered 2017-07-10: 300 mg via ORAL
  Filled 2017-07-10: qty 1

## 2017-07-10 MED ORDER — FLEET ENEMA 7-19 GM/118ML RE ENEM: 1.0000 | ENEMA | Freq: Once | RECTAL | Status: DC | PRN

## 2017-07-10 MED ORDER — METOCLOPRAMIDE HCL 5 MG PO TABS: 5.0000 mg | ORAL_TABLET | Freq: Three times a day (TID) | ORAL | Status: DC | PRN

## 2017-07-10 MED ORDER — ALBUTEROL 90 MCG/ACT IN AERS: 2.0000 | INHALATION_SPRAY | Freq: Four times a day (QID) | RESPIRATORY_TRACT | Status: DC | PRN

## 2017-07-10 MED ORDER — CEFAZOLIN SODIUM-DEXTROSE 2-4 GM/100ML-% IV SOLN
2.0000 g | INTRAVENOUS | Status: AC
  Administered 2017-07-10: 2 g via INTRAVENOUS
  Filled 2017-07-10: qty 100

## 2017-07-10 MED ORDER — SODIUM CHLORIDE 0.9 % IR SOLN
Status: DC | PRN
  Administered 2017-07-10: 1000 mL

## 2017-07-10 MED ORDER — ACETAMINOPHEN 500 MG PO TABS
1000.0000 mg | ORAL_TABLET | Freq: Four times a day (QID) | ORAL | Status: AC
  Administered 2017-07-10 – 2017-07-11 (×4): 1000 mg via ORAL
  Filled 2017-07-10 (×4): qty 2

## 2017-07-10 MED ORDER — SODIUM CHLORIDE 0.9 % IJ SOLN
INTRAMUSCULAR | Status: AC
  Filled 2017-07-10: qty 10

## 2017-07-10 MED ORDER — STERILE WATER FOR IRRIGATION IR SOLN
Status: DC | PRN
  Administered 2017-07-10: 2000 mL

## 2017-07-10 MED ORDER — MEPERIDINE HCL 50 MG/ML IJ SOLN: 6.2500 mg | INTRAMUSCULAR | Status: DC | PRN

## 2017-07-10 MED ORDER — POLYETHYLENE GLYCOL 3350 17 G PO PACK: 17.0000 g | PACK | Freq: Every day | ORAL | Status: DC | PRN

## 2017-07-10 MED ORDER — EPHEDRINE SULFATE-NACL 50-0.9 MG/10ML-% IV SOSY
PREFILLED_SYRINGE | INTRAVENOUS | Status: DC | PRN
  Administered 2017-07-10 (×3): 10 mg via INTRAVENOUS

## 2017-07-10 MED ORDER — SPIRONOLACTONE 25 MG PO TABS
25.0000 mg | ORAL_TABLET | Freq: Every day | ORAL | Status: DC
  Administered 2017-07-11: 25 mg via ORAL
  Filled 2017-07-10: qty 1

## 2017-07-10 MED ORDER — PROPOFOL 10 MG/ML IV BOLUS
INTRAVENOUS | Status: DC | PRN
  Administered 2017-07-10: 30 mg via INTRAVENOUS

## 2017-07-10 MED ORDER — ONDANSETRON HCL 4 MG/2ML IJ SOLN
INTRAMUSCULAR | Status: DC | PRN
  Administered 2017-07-10: 4 mg via INTRAVENOUS

## 2017-07-10 MED ORDER — DIPHENHYDRAMINE HCL 12.5 MG/5ML PO ELIX: 12.5000 mg | ORAL_SOLUTION | ORAL | Status: DC | PRN

## 2017-07-10 MED ORDER — MIDAZOLAM HCL 5 MG/5ML IJ SOLN
INTRAMUSCULAR | Status: DC | PRN
  Administered 2017-07-10: 2 mg via INTRAVENOUS

## 2017-07-10 SURGICAL SUPPLY — 48 items
BAG ZIPLOCK 12X15 (MISCELLANEOUS) ×3 IMPLANT
BANDAGE ACE 6X5 VEL STRL LF (GAUZE/BANDAGES/DRESSINGS) ×3 IMPLANT
BLADE SAG 18X100X1.27 (BLADE) ×3 IMPLANT
BLADE SAW SGTL 11.0X1.19X90.0M (BLADE) ×3 IMPLANT
BOWL SMART MIX CTS (DISPOSABLE) ×3 IMPLANT
CAPT KNEE TOTAL 3 ATTUNE ×3 IMPLANT
CEMENT HV SMART SET (Cement) ×6 IMPLANT
CLOSURE WOUND 1/2 X4 (GAUZE/BANDAGES/DRESSINGS) ×1
COVER SURGICAL LIGHT HANDLE (MISCELLANEOUS) ×3 IMPLANT
CUFF TOURN SGL QUICK 34 (TOURNIQUET CUFF) ×2
CUFF TRNQT CYL 34X4X40X1 (TOURNIQUET CUFF) ×1 IMPLANT
DECANTER SPIKE VIAL GLASS SM (MISCELLANEOUS) ×3 IMPLANT
DRAPE TOP 10253 STERILE (DRAPES) IMPLANT
DRAPE U-SHAPE 47X51 STRL (DRAPES) ×3 IMPLANT
DRSG ADAPTIC 3X8 NADH LF (GAUZE/BANDAGES/DRESSINGS) ×3 IMPLANT
DRSG PAD ABDOMINAL 8X10 ST (GAUZE/BANDAGES/DRESSINGS) ×3 IMPLANT
DURAPREP 26ML APPLICATOR (WOUND CARE) ×3 IMPLANT
ELECT REM PT RETURN 15FT ADLT (MISCELLANEOUS) ×3 IMPLANT
EVACUATOR 1/8 PVC DRAIN (DRAIN) ×3 IMPLANT
GAUZE SPONGE 4X4 12PLY STRL (GAUZE/BANDAGES/DRESSINGS) ×3 IMPLANT
GLOVE BIO SURGEON STRL SZ7.5 (GLOVE) ×6 IMPLANT
GLOVE BIO SURGEON STRL SZ8 (GLOVE) ×3 IMPLANT
GLOVE BIOGEL PI IND STRL 6.5 (GLOVE) ×3 IMPLANT
GLOVE BIOGEL PI IND STRL 8 (GLOVE) ×1 IMPLANT
GLOVE BIOGEL PI INDICATOR 6.5 (GLOVE) ×6
GLOVE BIOGEL PI INDICATOR 8 (GLOVE) ×2
GOWN STRL REUS W/TWL LRG LVL3 (GOWN DISPOSABLE) ×9 IMPLANT
GOWN STRL REUS W/TWL XL LVL3 (GOWN DISPOSABLE) ×3 IMPLANT
HANDPIECE INTERPULSE COAX TIP (DISPOSABLE) ×2
IMMOBILIZER KNEE 20 (SOFTGOODS) ×3
IMMOBILIZER KNEE 20 THIGH 36 (SOFTGOODS) ×1 IMPLANT
MANIFOLD NEPTUNE II (INSTRUMENTS) ×3 IMPLANT
NS IRRIG 1000ML POUR BTL (IV SOLUTION) ×3 IMPLANT
PACK TOTAL KNEE CUSTOM (KITS) ×3 IMPLANT
PADDING CAST COTTON 6X4 STRL (CAST SUPPLIES) ×3 IMPLANT
POSITIONER SURGICAL ARM (MISCELLANEOUS) ×3 IMPLANT
SET HNDPC FAN SPRY TIP SCT (DISPOSABLE) ×1 IMPLANT
STRIP CLOSURE SKIN 1/2X4 (GAUZE/BANDAGES/DRESSINGS) ×2 IMPLANT
SUT MNCRL AB 4-0 PS2 18 (SUTURE) ×3 IMPLANT
SUT STRATAFIX 0 PDS 27 VIOLET (SUTURE) ×3
SUT VIC AB 2-0 CT1 27 (SUTURE) ×6
SUT VIC AB 2-0 CT1 TAPERPNT 27 (SUTURE) ×3 IMPLANT
SUTURE STRATFX 0 PDS 27 VIOLET (SUTURE) ×1 IMPLANT
SYR 50ML LL SCALE MARK (SYRINGE) ×3 IMPLANT
TRAY FOLEY W/METER SILVER 16FR (SET/KITS/TRAYS/PACK) ×3 IMPLANT
WATER STERILE IRR 1000ML POUR (IV SOLUTION) ×6 IMPLANT
WRAP KNEE MAXI GEL POST OP (GAUZE/BANDAGES/DRESSINGS) ×3 IMPLANT
YANKAUER SUCT BULB TIP 10FT TU (MISCELLANEOUS) ×3 IMPLANT

## 2017-07-10 NOTE — Plan of Care (Signed)
Plan of care discussed.   

## 2017-07-10 NOTE — Transfer of Care (Signed)
Immediate Anesthesia Transfer of Care Note  Patient: Tawni CarnesRoberta W Balan  Procedure(s) Performed: LEFT TOTAL KNEE ARTHROPLASTY (Left Knee)  Patient Location: PACU  Anesthesia Type:Regional and Spinal  Level of Consciousness: awake, alert  and oriented  Airway & Oxygen Therapy: Patient Spontanous Breathing and Patient connected to face mask oxygen  Post-op Assessment: Report given to RN and Post -op Vital signs reviewed and stable  Post vital signs: Reviewed and stable  Last Vitals:  Vitals Value Taken Time  BP    Temp    Pulse 86 07/10/2017 10:10 AM  Resp 19 07/10/2017 10:10 AM  SpO2 100 % 07/10/2017 10:10 AM  Vitals shown include unvalidated device data.  Last Pain:  Vitals:   07/10/17 0635  TempSrc:   PainSc: 4          Complications: No apparent anesthesia complications

## 2017-07-10 NOTE — Interval H&P Note (Signed)
History and Physical Interval Note:  07/10/2017 6:34 AM  Gabriella Nguyen  has presented today for surgery, with the diagnosis of Osteoarthritis Left Knee  The various methods of treatment have been discussed with the patient and family. After consideration of risks, benefits and other options for treatment, the patient has consented to  Procedure(s): LEFT TOTAL KNEE ARTHROPLASTY (Left) as a surgical intervention .  The patient's history has been reviewed, patient examined, no change in status, stable for surgery.  I have reviewed the patient's chart and labs.  Questions were answered to the patient's satisfaction.     Homero FellersFrank Aryiana Klinkner

## 2017-07-10 NOTE — Op Note (Signed)
OPERATIVE REPORT-TOTAL KNEE ARTHROPLASTY   Pre-operative diagnosis- Osteoarthritis  Left knee(s)  Post-operative diagnosis- Osteoarthritis Left knee(s)  Procedure-  Left  Total Knee Arthroplasty  Surgeon- Gabriella RankinFrank V. Atanacio Melnyk, MD  Assistant- Avel Peacerew Perkins, PA-C   Anesthesia-  Adductor canal block and spinal  EBL-25 ml   Drains Hemovac  Tourniquet time-  Total Tourniquet Time Documented: Thigh (Left) - 35 minutes Total: Thigh (Left) - 35 minutes     Complications- None  Condition-PACU - hemodynamically stable.   Brief Clinical Note  Gabriella Nguyen is a 62 y.o. year old female with end stage OA of her left knee with progressively worsening pain and dysfunction. She has constant pain, with activity and at rest and significant functional deficits with difficulties even with ADLs. She has had extensive non-op management including analgesics, injections of cortisone and viscosupplements, and home exercise program, but remains in significant pain with significant dysfunction. Radiographs show bone on bone arthritis medial and patellofemoral. She presents now for left Total Knee Arthroplasty.    Procedure in detail---   The patient is brought into the operating room and positioned supine on the operating table. After successful administration of  adductor canal block and spinal,   a tourniquet is placed high on the  Left thigh(s) and the lower extremity is prepped and draped in the usual sterile fashion. Time out is performed by the operating team and then the  Left lower extremity is wrapped in Esmarch, knee flexed and the tourniquet inflated to 300 mmHg.       A midline incision is made with a ten blade through the subcutaneous tissue to the level of the extensor mechanism. A fresh blade is used to make a medial parapatellar arthrotomy. Soft tissue over the proximal medial tibia is subperiosteally elevated to the joint line with a knife and into the semimembranosus bursa with a Cobb  elevator. Soft tissue over the proximal lateral tibia is elevated with attention being paid to avoiding the patellar tendon on the tibial tubercle. The patella is everted, knee flexed 90 degrees and the ACL and PCL are removed. Findings are bone on bone medial and patellofemoral with large global osteophytes.        The drill is used to create a starting hole in the distal femur and the canal is thoroughly irrigated with sterile saline to remove the fatty contents. The 5 degree Left  valgus alignment guide is placed into the femoral canal and the distal femoral cutting block is pinned to remove 9 mm off the distal femur. Resection is made with an oscillating saw.      The tibia is subluxed forward and the menisci are removed. The extramedullary alignment guide is placed referencing proximally at the medial aspect of the tibial tubercle and distally along the second metatarsal axis and tibial crest. The block is pinned to remove 2mm off the more deficient medial  side. Resection is made with an oscillating saw. Size 6is the most appropriate size for the tibia and the proximal tibia is prepared with the modular drill and keel punch for that size.      The femoral sizing guide is placed and size 6 is most appropriate. Rotation is marked off the epicondylar axis and confirmed by creating a rectangular flexion gap at 90 degrees. The size 6 cutting block is pinned in this rotation and the anterior, posterior and chamfer cuts are made with the oscillating saw. The intercondylar block is then placed and that cut is made.  Trial size 6 tibial component, trial size 6 posterior stabilized femur and a 10  mm posterior stabilized rotating platform insert trial is placed. Full extension is achieved with excellent varus/valgus and anterior/posterior balance throughout full range of motion. The patella is everted and thickness measured to be 24  mm. Free hand resection is taken to 14 mm, a 38 template is placed, lug holes  are drilled, trial patella is placed, and it tracks normally. Osteophytes are removed off the posterior femur with the trial in place. All trials are removed and the cut bone surfaces prepared with pulsatile lavage. Cement is mixed and once ready for implantation, the size 6 tibial implant, size  6 posterior stabilized femoral component, and the size 38 patella are cemented in place and the patella is held with the clamp. The trial insert is placed and the knee held in full extension. The Exparel (20 ml mixed with 60 ml saline) is injected into the extensor mechanism, posterior capsule, medial and lateral gutters and subcutaneous tissues.  All extruded cement is removed and once the cement is hard the permanent 10 mm posterior stabilized rotating platform insert is placed into the tibial tray.      The wound is copiously irrigated with saline solution and the extensor mechanism closed over a hemovac drain with #1 V-loc suture. The tourniquet is released for a total tourniquet time of 35  minutes. Flexion against gravity is 140 degrees and the patella tracks normally. Subcutaneous tissue is closed with 2.0 vicryl and subcuticular with running 4.0 Monocryl. The incision is cleaned and dried and steri-strips and a bulky sterile dressing are applied. The limb is placed into a knee immobilizer and the patient is awakened and transported to recovery in stable condition.      Please note that a surgical assistant was a medical necessity for this procedure in order to perform it in a safe and expeditious manner. Surgical assistant was necessary to retract the ligaments and vital neurovascular structures to prevent injury to them and also necessary for proper positioning of the limb to allow for anatomic placement of the prosthesis.   Gabriella Nguyen Gabriella Steier, MD    07/10/2017, 9:35 AM

## 2017-07-10 NOTE — Anesthesia Postprocedure Evaluation (Signed)
Anesthesia Post Note  Patient: Gabriella CarnesRoberta W Nguyen  Procedure(s) Performed: LEFT TOTAL KNEE ARTHROPLASTY (Left Knee)     Patient location during evaluation: PACU Anesthesia Type: Spinal Level of consciousness: awake and alert Pain management: pain level controlled Vital Signs Assessment: post-procedure vital signs reviewed and stable Respiratory status: spontaneous breathing and respiratory function stable Cardiovascular status: blood pressure returned to baseline and stable Postop Assessment: no headache, no backache, spinal receding and no apparent nausea or vomiting Anesthetic complications: no    Last Vitals:  Vitals:   07/10/17 1259 07/10/17 1400  BP: 121/78 123/78  Pulse: 75 83  Resp: 20 16  Temp: 36.7 C 36.7 C  SpO2: 100% 100%    Last Pain:  Vitals:   07/10/17 1400  TempSrc: Oral  PainSc:                  Phillips Groutarignan, Briana Farner

## 2017-07-10 NOTE — Discharge Instructions (Addendum)
° °Dr. Frank Aluisio °Total Joint Specialist °[facility] °[address] °[place], [place] [address] °[phone] ° °TOTAL KNEE REPLACEMENT POSTOPERATIVE DIRECTIONS ° °Knee Rehabilitation, Guidelines Following Surgery  °Results after knee surgery are often greatly improved when you follow the exercise, range of motion and muscle strengthening exercises prescribed by your doctor. Safety measures are also important to protect the knee from further injury. Any time any of these exercises cause you to have increased pain or swelling in your knee joint, decrease the amount until you are comfortable again and slowly increase them. If you have problems or questions, call your caregiver or physical therapist for advice.  ° °HOME CARE INSTRUCTIONS  °Remove items at home which could result in a fall. This includes throw rugs or furniture in walking pathways.  °· ICE to the affected knee every three hours for 30 minutes at a time and then as needed for pain and swelling.  Continue to use ice on the knee for pain and swelling from surgery. You may notice swelling that will progress down to the foot and ankle.  This is normal after surgery.  Elevate the leg when you are not up walking on it.   °· Continue to use the breathing machine which will help keep your temperature down.  It is common for your temperature to cycle up and down following surgery, especially at night when you are not up moving around and exerting yourself.  The breathing machine keeps your lungs expanded and your temperature down. °· Do not place pillow under knee, focus on keeping the knee straight while resting ° °DIET °You may resume your previous home diet once your are discharged from the hospital. ° °DRESSING / WOUND CARE / SHOWERING °You may shower 3 days after surgery, but keep the wounds dry during showering.  You may use an occlusive plastic wrap (Press'n Seal for example), NO SOAKING/SUBMERGING IN THE BATHTUB.  If the bandage gets  wet, change with a clean dry gauze.  If the incision gets wet, pat the wound dry with a clean towel. °You may start showering once you are discharged home but do not submerge the incision under water. Just pat the incision dry and apply a dry gauze dressing on daily. °Change the surgical dressing daily and reapply a dry dressing each time. ° °ACTIVITY °Walk with your walker as instructed. °Use walker as long as suggested by your caregivers. °Avoid periods of inactivity such as sitting longer than an hour when not asleep. This helps prevent blood clots.  °You may resume a sexual relationship in one month or when given the OK by your doctor.  °You may return to work once you are cleared by your doctor.  °Do not drive a car for 6 weeks or until released by you surgeon.  °Do not drive while taking narcotics. ° °WEIGHT BEARING °Weight bearing as tolerated with assist device (walker, cane, etc) as directed, use it as long as suggested by your surgeon or therapist, typically at least 4-6 weeks. ° °POSTOPERATIVE CONSTIPATION PROTOCOL °Constipation - defined medically as fewer than three stools per week and severe constipation as less than one stool per week. ° °One of the most common issues patients have following surgery is constipation.  Even if you have a regular bowel pattern at home, your normal regimen is likely to be disrupted due to multiple reasons following surgery.  Combination of anesthesia, postoperative narcotics, change in appetite and fluid intake all can affect your bowels.    In order to avoid complications following surgery, here are some recommendations in order to help you during your recovery period. ° °Colace (docusate) - Pick up an over-the-counter form of Colace or another stool softener and take twice a day as long as you are requiring postoperative pain medications.  Take with a full glass of water daily.  If you experience loose stools or diarrhea, hold the colace until you stool forms back up.  If  your symptoms do not get better within 1 week or if they get worse, check with your doctor. ° °Dulcolax (bisacodyl) - Pick up over-the-counter and take as directed by the product packaging as needed to assist with the movement of your bowels.  Take with a full glass of water.  Use this product as needed if not relieved by Colace only.  ° °MiraLax (polyethylene glycol) - Pick up over-the-counter to have on hand.  MiraLax is a solution that will increase the amount of water in your bowels to assist with bowel movements.  Take as directed and can mix with a glass of water, juice, soda, coffee, or tea.  Take if you go more than two days without a movement. °Do not use MiraLax more than once per day. Call your doctor if you are still constipated or irregular after using this medication for 7 days in a row. ° °If you continue to have problems with postoperative constipation, please contact the office for further assistance and recommendations.  If you experience "the worst abdominal pain ever" or develop nausea or vomiting, please contact the office immediatly for further recommendations for treatment. ° °ITCHING ° If you experience itching with your medications, try taking only a single pain pill, or even half a pain pill at a time.  You can also use Benadryl over the counter for itching or also to help with sleep.  ° °TED HOSE STOCKINGS °Wear the elastic stockings on both legs for three weeks following surgery during the day but you may remove then at night for sleeping. ° °MEDICATIONS °See your medication summary on the “After Visit Summary” that the nursing staff will review with you prior to discharge.  You may have some home medications which will be placed on hold until you complete the course of blood thinner medication.  It is important for you to complete the blood thinner medication as prescribed by your surgeon.  Continue your approved medications as instructed at time of discharge. ° °PRECAUTIONS °If you  experience chest pain or shortness of breath - call 911 immediately for transfer to the hospital emergency department.  °If you develop a fever greater that 101 F, purulent drainage from wound, increased redness or drainage from wound, foul odor from the wound/dressing, or calf pain - CONTACT YOUR SURGEON.   °                                                °FOLLOW-UP APPOINTMENTS °Make sure you keep all of your appointments after your operation with your surgeon and caregivers. You should call the office at the above phone number and make an appointment for approximately two weeks after the date of your surgery or on the date instructed by your surgeon outlined in the "After Visit Summary". ° ° °RANGE OF MOTION AND STRENGTHENING EXERCISES  °Rehabilitation of the knee is important following a knee injury or   an operation. After just a few days of immobilization, the muscles of the thigh which control the knee become weakened and shrink (atrophy). Knee exercises are designed to build up the tone and strength of the thigh muscles and to improve knee motion. Often times heat used for twenty to thirty minutes before working out will loosen up your tissues and help with improving the range of motion but do not use heat for the first two weeks following surgery. These exercises can be done on a training (exercise) mat, on the floor, on a table or on a bed. Use what ever works the best and is most comfortable for you Knee exercises include:  °Leg Lifts - While your knee is still immobilized in a splint or cast, you can do straight leg raises. Lift the leg to 60 degrees, hold for 3 sec, and slowly lower the leg. Repeat 10-20 times 2-3 times daily. Perform this exercise against resistance later as your knee gets better.  °Quad and Hamstring Sets - Tighten up the muscle on the front of the thigh (Quad) and hold for 5-10 sec. Repeat this 10-20 times hourly. Hamstring sets are done by pushing the foot backward against an object  and holding for 5-10 sec. Repeat as with quad sets.  °· Leg Slides: Lying on your back, slowly slide your foot toward your buttocks, bending your knee up off the floor (only go as far as is comfortable). Then slowly slide your foot back down until your leg is flat on the floor again. °· Angel Wings: Lying on your back spread your legs to the side as far apart as you can without causing discomfort.  °A rehabilitation program following serious knee injuries can speed recovery and prevent re-injury in the future due to weakened muscles. Contact your doctor or a physical therapist for more information on knee rehabilitation.  ° °IF YOU ARE TRANSFERRED TO A SKILLED REHAB FACILITY °If the patient is transferred to a skilled rehab facility following release from the hospital, a list of the current medications will be sent to the facility for the patient to continue.  When discharged from the skilled rehab facility, please have the facility set up the patient's Home Health Physical Therapy prior to being released. Also, the skilled facility will be responsible for providing the patient with their medications at time of release from the facility to include their pain medication, the muscle relaxants, and their blood thinner medication. If the patient is still at the rehab facility at time of the two week follow up appointment, the skilled rehab facility will also need to assist the patient in arranging follow up appointment in our office and any transportation needs. ° °MAKE SURE YOU:  °Understand these instructions.  °Get help right away if you are not doing well or get worse.  ° ° °Pick up stool softner and laxative for home use following surgery while on pain medications. °Do not submerge incision under water. °Please use good hand washing techniques while changing dressing each day. °May shower starting three days after surgery. °Please use a clean towel to pat the incision dry following showers. °Continue to use ice for  pain and swelling after surgery. °Do not use any lotions or creams on the incision until instructed by your surgeon. ° °Take Xarelto for two and a half more weeks following discharge from the hospital, then discontinue Xarelto. °Once the patient has completed the Xarelto, they may resume the 81 mg Aspirin. ° ° ° °Information on   my medicine - XARELTO® (Rivaroxaban) ° °This medication education was reviewed with me or my healthcare representative as part of my discharge preparation.  The pharmacist that spoke with me during my hospital stay was:   ° °Why was Xarelto® prescribed for you? °Xarelto® was prescribed for you to reduce the risk of blood clots forming after orthopedic surgery. The medical term for these abnormal blood clots is venous thromboembolism (VTE). ° °What do you need to know about xarelto® ? °Take your Xarelto® ONCE DAILY at the same time every day. °You may take it either with or without food. ° °If you have difficulty swallowing the tablet whole, you may crush it and mix in applesauce just prior to taking your dose. ° °Take Xarelto® exactly as prescribed by your doctor and DO NOT stop taking Xarelto® without talking to the doctor who prescribed the medication.  Stopping without other VTE prevention medication to take the place of Xarelto® may increase your risk of developing a clot. ° °After discharge, you should have regular check-up appointments with your healthcare provider that is prescribing your Xarelto®.   ° °What do you do if you miss a dose? °If you miss a dose, take it as soon as you remember on the same day then continue your regularly scheduled once daily regimen the next day. Do not take two doses of Xarelto® on the same day.  ° °Important Safety Information °A possible side effect of Xarelto® is bleeding. You should call your healthcare provider right away if you experience any of the following: °? Bleeding from an injury or your nose that does not stop. °? Unusual colored urine (red  or dark brown) or unusual colored stools (red or black). °? Unusual bruising for unknown reasons. °? A serious fall or if you hit your head (even if there is no bleeding). ° °Some medicines may interact with Xarelto® and might increase your risk of bleeding while on Xarelto®. To help avoid this, consult your healthcare provider or pharmacist prior to using any new prescription or non-prescription medications, including herbals, vitamins, non-steroidal anti-inflammatory drugs (NSAIDs) and supplements. ° °This website has more information on Xarelto®: www.xarelto.com. ° ° ° °

## 2017-07-10 NOTE — Anesthesia Procedure Notes (Signed)
Anesthesia Regional Block: Adductor canal block   Pre-Anesthetic Checklist: ,, timeout performed, Correct Patient, Correct Site, Correct Laterality, Correct Procedure, Correct Position, site marked, Risks and benefits discussed,  Surgical consent,  Pre-op evaluation,  At surgeon's request and post-op pain management  Laterality: Left and Lower  Prep: Maximum Sterile Barrier Precautions used, chloraprep       Needles:  Injection technique: Single-shot  Needle Type: Echogenic Stimulator Needle     Needle Length: 10cm      Additional Needles:   Procedures:,,,, ultrasound used (permanent image in chart),,,,  Narrative:  Start time: 07/10/2017 8:09 AM End time: 07/10/2017 8:14 AM Injection made incrementally with aspirations every 5 mL.  Performed by: Personally  Anesthesiologist: Phillips Groutarignan, Joley Utecht, MD  Additional Notes: Risks, benefits and alternative to block explained extensively.  Patient tolerated procedure well, without complications.

## 2017-07-10 NOTE — Evaluation (Signed)
Physical Therapy Evaluation Patient Details Name: Gabriella CarnesRoberta W Nguyen MRN: [id number] DOB: Aug 25, 1955 Today's Date: 07/10/2017   History of Present Illness  s/p L TKA; PMHx: R TKR revision  Clinical Impression  Pt is s/p TKA resulting in the deficits listed below (see PT Problem List).  Pt amb 60' with min assist, incr time needed; pt requested to go back to bed after session;  Pt will benefit from skilled PT to increase their independence and safety with mobility to allow discharge to the venue listed below.      Follow Up Recommendations Follow surgeon's recommendation for DC plan and follow-up therapies    Equipment Recommendations  Rolling walker with 5" wheels    Recommendations for Other Services       Precautions / Restrictions Precautions Precautions: Knee;Fall Required Braces or Orthoses: Knee Immobilizer - Left Knee Immobilizer - Left: Discontinue once straight leg raise with < 10 degree lag Restrictions Weight Bearing Restrictions: No      Mobility  Bed Mobility Overal bed mobility: Needs Assistance Bed Mobility: Supine to Sit;Sit to Supine     Supine to sit: Mod assist Sit to supine: Mod assist   General bed mobility comments: cues for technique, incr time, assist with bil LEs  Transfers Overall transfer level: Needs assistance Equipment used: Rolling walker (2 wheeled) Transfers: Sit to/from Stand Sit to Stand: Min assist;From elevated surface         General transfer comment: bed elevated significantly, assist to rise and stabilize  Ambulation/Gait Ambulation/Gait assistance: Min guard Ambulation Distance (Feet): 60 Feet Assistive device: Rolling walker (2 wheeled) Gait Pattern/deviations: Step-through pattern     General Gait Details: cues for sequence  Stairs            Wheelchair Mobility    Modified Rankin (Stroke Patients Only)       Balance                                             Pertinent  Vitals/Pain Pain Assessment: 0-10 Pain Score: 2  Pain Location: L knee Pain Descriptors / Indicators: Aching;Sore Pain Intervention(s): Limited activity within patient's tolerance;Monitored during session;Premedicated before session;Repositioned    Home Living Family/patient expects to be discharged to:: Private residence Living Arrangements: Children;Other relatives Available Help at Discharge: Family;Available PRN/intermittently   Home Access: Stairs to enter Entrance Stairs-Rails: (pull handle on one side) Entrance Stairs-Number of Steps: 2 Home Layout: One level Home Equipment: Bedside commode;Shower seat - built in;Walker - 2 wheels      Prior Function Level of Independence: Independent               Hand Dominance        Extremity/Trunk Assessment   Upper Extremity Assessment Upper Extremity Assessment: Overall WFL for tasks assessed    Lower Extremity Assessment Lower Extremity Assessment: RLE deficits/detail RLE Deficits / Details: grossly 3+/5,  knee AROM grossly 5* to 95*, movement slow LLE Deficits / Details: ankle WFL;  AAROM 10--45*, limited by pain; hip flexion and knee extension 2/5       Communication   Communication: No difficulties  Cognition Arousal/Alertness: Awake/alert Behavior During Therapy: WFL for tasks assessed/performed Overall Cognitive Status: Within Functional Limits for tasks assessed  General Comments      Exercises Total Joint Exercises Ankle Circles/Pumps: AROM;Both;10 reps Quad Sets: AROM;Both;10 reps   Assessment/Plan    PT Assessment Patient needs continued PT services  PT Problem List Decreased strength;Decreased range of motion;Decreased activity tolerance;Decreased mobility;Decreased knowledge of use of DME       PT Treatment Interventions DME instruction;Therapeutic activities;Therapeutic exercise;Gait training;Functional mobility training;Patient/family  education;Balance training;Stair training    PT Goals (Current goals can be found in the Care Plan section)  Acute Rehab PT Goals Patient Stated Goal: home, less knee pain PT Goal Formulation: With patient Time For Goal Achievement: 07/17/17 Potential to Achieve Goals: Good    Frequency 7X/week   Barriers to discharge        Co-evaluation               AM-PAC PT "6 Clicks" Daily Activity  Outcome Measure Difficulty turning over in bed (including adjusting bedclothes, sheets and blankets)?: Unable Difficulty moving from lying on back to sitting on the side of the bed? : Unable Difficulty sitting down on and standing up from a chair with arms (e.g., wheelchair, bedside commode, etc,.)?: Unable Help needed moving to and from a bed to chair (including a wheelchair)?: A Little Help needed walking in hospital room?: A Lot Help needed climbing 3-5 steps with a railing? : A Lot 6 Click Score: 10    End of Session Equipment Utilized During Treatment: Gait belt Activity Tolerance: Patient tolerated treatment well Patient left: in bed;with call bell/phone within reach;with bed alarm set;with family/visitor present Nurse Communication: Mobility status PT Visit Diagnosis: Difficulty in walking, not elsewhere classified (R26.2)    Time: 1431-1510 PT Time Calculation (min) (ACUTE ONLY): 39 min   Charges:   PT Evaluation $PT Eval Low Complexity: 1 Low PT Treatments $Gait Training: 8-22 mins $Therapeutic Activity: 8-22 mins   PT G CodesDrucilla Chalet, PT Pager: [phone] 07/10/2017   [facility] 07/10/2017, 3:56 PM

## 2017-07-10 NOTE — Anesthesia Procedure Notes (Signed)
Spinal  Patient location during procedure: OR End time: 07/10/2017 8:34 AM Staffing Anesthesiologist: Montez Hageman, MD Performed: anesthesiologist  Preanesthetic Checklist Completed: patient identified, site marked, surgical consent, pre-op evaluation, timeout performed, IV checked, risks and benefits discussed and monitors and equipment checked Spinal Block Patient position: sitting Prep: Betadine Patient monitoring: heart rate, continuous pulse ox and blood pressure Approach: right paramedian Location: L3-4 Injection technique: single-shot Needle Needle type: Sprotte  Needle gauge: 24 G Needle length: 9 cm Additional Notes Expiration date of kit checked and confirmed. Patient tolerated procedure well, without complications.

## 2017-07-10 NOTE — Anesthesia Preprocedure Evaluation (Addendum)
Anesthesia Evaluation  Patient identified by MRN, date of birth, ID band Patient awake    Reviewed: Allergy & Precautions, NPO status , Patient's Chart, lab work & pertinent test results, reviewed documented beta blocker date and time   History of Anesthesia Complications Negative for: history of anesthetic complications  Airway Mallampati: II  TM Distance: >3 FB Neck ROM: Full    Dental  (+) Partial Upper   Pulmonary neg shortness of breath, asthma , neg sleep apnea, neg recent URI, former smoker,    breath sounds clear to auscultation       Cardiovascular hypertension, Pt. on medications and Pt. on home beta blockers + angina (-) Past MI and (-) CHF  Rhythm:Regular     Neuro/Psych negative neurological ROS  negative psych ROS   GI/Hepatic negative GI ROS, Neg liver ROS,   Endo/Other    Renal/GU negative Renal ROS     Musculoskeletal  (+) Arthritis ,   Abdominal   Peds  Hematology negative hematology ROS (+)   Anesthesia Other Findings   Reproductive/Obstetrics                            Anesthesia Physical  Anesthesia Plan  ASA: II  Anesthesia Plan: Spinal   Post-op Pain Management:  Regional for Post-op pain   Induction:   PONV Risk Score and Plan: 2 and Ondansetron and Dexamethasone  Airway Management Planned: Nasal Cannula  Additional Equipment: None  Intra-op Plan:   Post-operative Plan:   Informed Consent: I have reviewed the patients History and Physical, chart, labs and discussed the procedure including the risks, benefits and alternatives for the proposed anesthesia with the patient or authorized representative who has indicated his/her understanding and acceptance.   Dental advisory given  Plan Discussed with: CRNA and Surgeon  Anesthesia Plan Comments:        Anesthesia Quick Evaluation

## 2017-07-10 NOTE — Progress Notes (Signed)
AssistedDr. Carignan with left, ultrasound guided, adductor canal block. Side rails up, monitors on throughout procedure. See vital signs in flow sheet. Tolerated Procedure well.  

## 2017-07-11 LAB — BASIC METABOLIC PANEL
ANION GAP: 10 (ref 5–15)
BUN: 9 mg/dL (ref 6–20)
CALCIUM: 9 mg/dL (ref 8.9–10.3)
CO2: 23 mmol/L (ref 22–32)
Chloride: 104 mmol/L (ref 101–111)
Creatinine, Ser: 0.6 mg/dL (ref 0.44–1.00)
Glucose, Bld: 118 mg/dL — ABNORMAL HIGH (ref 65–99)
Potassium: 3.9 mmol/L (ref 3.5–5.1)
SODIUM: 137 mmol/L (ref 135–145)

## 2017-07-11 LAB — CBC
HCT: 31.9 % — ABNORMAL LOW (ref 36.0–46.0)
Hemoglobin: 10.8 g/dL — ABNORMAL LOW (ref 12.0–15.0)
MCH: 31.6 pg (ref 26.0–34.0)
MCHC: 33.9 g/dL (ref 30.0–36.0)
MCV: 93.3 fL (ref 78.0–100.0)
PLATELETS: 201 10*3/uL (ref 150–400)
RBC: 3.42 MIL/uL — ABNORMAL LOW (ref 3.87–5.11)
RDW: 13 % (ref 11.5–15.5)
WBC: 9 10*3/uL (ref 4.0–10.5)

## 2017-07-11 MED ORDER — TRAMADOL HCL 50 MG PO TABS: 50.0000 mg | ORAL_TABLET | Freq: Four times a day (QID) | ORAL | 0 refills | Status: DC | PRN

## 2017-07-11 MED ORDER — METHOCARBAMOL 500 MG PO TABS: 500.0000 mg | ORAL_TABLET | Freq: Four times a day (QID) | ORAL | 0 refills | Status: DC | PRN

## 2017-07-11 MED ORDER — OXYCODONE HCL 5 MG PO TABS: 5.0000 mg | ORAL_TABLET | ORAL | 0 refills | Status: DC | PRN

## 2017-07-11 MED ORDER — RIVAROXABAN 10 MG PO TABS: 10.0000 mg | ORAL_TABLET | Freq: Every day | ORAL | 0 refills | Status: DC

## 2017-07-11 MED ORDER — GABAPENTIN 300 MG PO CAPS: 300.0000 mg | ORAL_CAPSULE | Freq: Three times a day (TID) | ORAL | 0 refills | Status: DC

## 2017-07-11 NOTE — Progress Notes (Signed)
Physical Therapy Treatment Patient Details Name: Gabriella CarnesRoberta W Nguyen MRN: [id number] DOB: 1955/04/16 Today's Date: 07/11/2017    History of Present Illness s/p L TKA; PMHx: R TKR revision    PT Comments    POD # 1 pm session Gabriella Nguyen present for education and "hands on" instruction.  First pt was unable to perform active SLR so educated on KI use and proper application.  "Hands on" instructed son safe handling with transfers and amb as well as practiced up 2 steps backward with walker due to no rails.  Instructed to wear KI for amb and stairs until able to perform active SLR.  Returned to room and instructed pt how to use a belt to self assist L LE up onto bed as son was unable do to his acute back pain.  Instructed on TE's following HEP handout and use of ICE.  All mobility questions addressed.  Pt wants to go home today and stated she will always have a family member with her.  Mainly her sister.    Follow Up Recommendations  Follow surgeon's recommendation for DC plan and follow-up therapies;Home health PT     Equipment Recommendations  (pt stated she has all equipment)    Recommendations for Other Services       Precautions / Restrictions Precautions Precautions: Knee;Fall Precaution Comments: instructed on KI use for stairs and amb until able to perform SLR Required Braces or Orthoses: Knee Immobilizer - Left Knee Immobilizer - Left: Discontinue once straight leg raise with < 10 degree lag Restrictions Weight Bearing Restrictions: No Other Position/Activity Restrictions: WBAT    Mobility  Bed Mobility Overal bed mobility: Needs Assistance Bed Mobility: Sit to Supine       Sit to supine: Min assist   General bed mobility comments: instructed on how to use a belt to self assist l LE up onto bed  Transfers Overall transfer level: Needs assistance Equipment used: Rolling walker (2 wheeled) Transfers: Sit to/from Stand Sit to Stand: Supervision;Min guard         General  transfer comment: elevated surface and 15% VC's on proper hand placement and increased time as well as "hands on" instruction to son on safe handling  Ambulation/Gait Ambulation/Gait assistance: Supervision;Min guard Ambulation Distance (Feet): 40 Feet Assistive device: Rolling walker (2 wheeled) Gait Pattern/deviations: Step-to pattern;Decreased stance time - left Gait velocity: decreased   General Gait Details: tolerated an increased distance with sone present for "hands on" instruction safety esp with turns and backward gait.  Instructed on KI use for amb until able to perform active SLR.     Stairs Stairs: Yes Stairs assistance: Min assist Stair Management: No rails;Step to pattern;Backwards;With walker Number of Stairs: 2 General stair comments: performed twice with son for "hands on" instruction on proper tech, safety and walker placement.     Wheelchair Mobility    Modified Rankin (Stroke Patients Only)       Balance                                            Cognition Arousal/Alertness: Awake/alert Behavior During Therapy: WFL for tasks assessed/performed Overall Cognitive Status: Within Functional Limits for tasks assessed  Exercises   Total Knee Replacement TE's 10 reps B LE ankle pumps 10 reps towel squeezes 10 reps knee presses 05 reps heel slides  05 reps SLR's 05 reps ABD Followed by ICE     General Comments        Pertinent Vitals/Pain Pain Assessment: 0-10 Pain Score: 5  Pain Location: L knee Pain Descriptors / Indicators: Aching;Sore;Operative site guarding Pain Intervention(s): Monitored during session;Repositioned;Ice applied;Premedicated before session    Home Living                      Prior Function            PT Goals (current goals can now be found in the care plan section) Progress towards PT goals: Progressing toward goals    Frequency     7X/week      PT Plan Current plan remains appropriate    Co-evaluation              AM-PAC PT "6 Clicks" Daily Activity  Outcome Measure  Difficulty turning over in bed (including adjusting bedclothes, sheets and blankets)?: A Lot Difficulty moving from lying on back to sitting on the side of the bed? : A Lot Difficulty sitting down on and standing up from a chair with arms (e.g., wheelchair, bedside commode, etc,.)?: A Lot Help needed moving to and from a bed to chair (including a wheelchair)?: A Lot Help needed walking in hospital room?: A Lot Help needed climbing 3-5 steps with a railing? : A Lot 6 Click Score: 12    End of Session Equipment Utilized During Treatment: Gait belt Activity Tolerance: Patient tolerated treatment well Patient left: in bed;with family/visitor present;with call bell/phone within reach Nurse Communication: (pt ready for D/C to home) PT Visit Diagnosis: Difficulty in walking, not elsewhere classified (R26.2)     Time: 1610-9604 PT Time Calculation (min) (ACUTE ONLY): 45 min  Charges:  $Gait Training: 8-22 mins $Therapeutic Exercise: 8-22 mins $Therapeutic Activity: 8-22 mins                    G Codes:       Felecia Shelling  PTA [facility]  Acute  Rehab Pager      [phone]

## 2017-07-11 NOTE — Discharge Summary (Signed)
Physician Discharge Summary   Patient ID: Gabriella Nguyen MRN: [id number] DOB/AGE: Jun 07, 1955 62 y.o.  Admit date: 07/10/2017 Discharge date: 07/11/2017  Primary Diagnosis:  Osteoarthritis  Left knee(s)   Admission Diagnoses:  Past Medical History:  Diagnosis Date  . Aspiration pneumonia ([place]) 06/2011 hosp  . ASTHMA   . OSTEOARTHRITIS, KNEES, BILATERAL   . Unspecified essential hypertension    Discharge Diagnoses:   Principal Problem:   OA (osteoarthritis) of knee  Estimated body mass index is 29.29 kg/m as calculated from the following:   Height as of this encounter: 6' (1.829 m).   Weight as of this encounter: 98 kg (216 lb).  Procedure:  Procedure(s) (LRB): LEFT TOTAL KNEE ARTHROPLASTY (Left)   Consults: None  HPI: Gabriella Nguyen is a 62 y.o. year old female with end stage OA of her left knee with progressively worsening pain and dysfunction. She has constant pain, with activity and at rest and significant functional deficits with difficulties even with ADLs. She has had extensive non-op management including analgesics, injections of cortisone and viscosupplements, and home exercise program, but remains in significant pain with significant dysfunction. Radiographs show bone on bone arthritis medial and patellofemoral. She presents now for left Total Knee Arthroplasty.     Laboratory Data: Admission on 07/10/2017, Discharged on 07/11/2017  Component Date Value Ref Range Status  . WBC 07/11/2017 9.0  4.0 - 10.5 K/uL Final  . RBC 07/11/2017 3.42* 3.87 - 5.11 MIL/uL Final  . Hemoglobin 07/11/2017 10.8* 12.0 - 15.0 g/dL Final  . HCT 07/11/2017 31.9* 36.0 - 46.0 % Final  . MCV 07/11/2017 93.3  78.0 - 100.0 fL Final  . MCH 07/11/2017 31.6  26.0 - 34.0 pg Final  . MCHC 07/11/2017 33.9  30.0 - 36.0 g/dL Final  . RDW 07/11/2017 13.0  11.5 - 15.5 % Final  . Platelets 07/11/2017 201  150 - 400 K/uL Final   Performed at [facility], [place] [address]  [place], [place] [address]  . Sodium 07/11/2017 137  135 - 145 mmol/L Final  . Potassium 07/11/2017 3.9  3.5 - 5.1 mmol/L Final  . Chloride 07/11/2017 104  101 - 111 mmol/L Final  . CO2 07/11/2017 23  22 - 32 mmol/L Final  . Glucose, Bld 07/11/2017 118* 65 - 99 mg/dL Final  . BUN 07/11/2017 9  6 - 20 mg/dL Final  . Creatinine, Ser 07/11/2017 0.60  0.44 - 1.00 mg/dL Final  . Calcium 07/11/2017 9.0  8.9 - 10.3 mg/dL Final  . GFR calc non Af Amer 07/11/2017 >60  >60 mL/min Final  . GFR calc Af Amer 07/11/2017 >60  >60 mL/min Final   Comment: (NOTE) The eGFR has been calculated using the CKD EPI equation. This calculation has not been validated in all clinical situations. eGFR's persistently <60 mL/min signify possible Chronic Kidney Disease.   [name] gap 07/11/2017 10  5 - 15 Final   Performed at [facility], [place] [address], [place], [place] [address]  Hospital Outpatient Visit on 07/05/2017  Component Date Value Ref Range Status  . aPTT 07/05/2017 32  24 - 36 seconds Final   Performed at [facility], [place] [address], [place],  [address]  . WBC 07/05/2017 5.7  4.0 - 10.5 K/uL Final  . RBC 07/05/2017 4.17  3.87 - 5.11 MIL/uL Final  . Hemoglobin 07/05/2017 13.1  12.0 - 15.0 g/dL Final  . HCT 07/05/2017 38.5  36.0 - 46.0 %  Final  . MCV 07/05/2017 92.3  78.0 - 100.0 fL Final  . MCH 07/05/2017 31.4  26.0 - 34.0 pg Final  . MCHC 07/05/2017 34.0  30.0 - 36.0 g/dL Final  . RDW 07/05/2017 13.0  11.5 - 15.5 % Final  . Platelets 07/05/2017 244  150 - 400 K/uL Final   Performed at [facility], [place] [address] [place], [place] [address]  . Sodium 07/05/2017 138  135 - 145 mmol/L Final  . Potassium 07/05/2017 3.6  3.5 - 5.1 mmol/L Final  . Chloride 07/05/2017 102  101 - 111 mmol/L Final  . CO2 07/05/2017 28  22 - 32 mmol/L Final  . Glucose, Bld 07/05/2017 92  65 - 99 mg/dL Final  . BUN 07/05/2017 12  6 - 20 mg/dL Final  . Creatinine,  Ser 07/05/2017 0.62  0.44 - 1.00 mg/dL Final  . Calcium 07/05/2017 9.4  8.9 - 10.3 mg/dL Final  . Total Protein 07/05/2017 6.8  6.5 - 8.1 g/dL Final  . Albumin 07/05/2017 3.8  3.5 - 5.0 g/dL Final  . AST 07/05/2017 19  15 - 41 U/L Final  . ALT 07/05/2017 12* 14 - 54 U/L Final  . Alkaline Phosphatase 07/05/2017 63  38 - 126 U/L Final  . Total Bilirubin 07/05/2017 0.9  0.3 - 1.2 mg/dL Final  . GFR calc non Af Amer 07/05/2017 >60  >60 mL/min Final  . GFR calc Af Amer 07/05/2017 >60  >60 mL/min Final   Comment: (NOTE) The eGFR has been calculated using the CKD EPI equation. This calculation has not been validated in all clinical situations. eGFR's persistently <60 mL/min signify possible Chronic Kidney Disease.   [name] gap 07/05/2017 8  5 - 15 Final   Performed at [facility], [place] [address], [place], [place] [address]  . Prothrombin Time 07/05/2017 13.2  11.4 - 15.2 seconds Final  . INR 07/05/2017 1.01   Final   Performed at [facility], [place] [address], [place], [place] [address]  . ABO/RH(D) 07/05/2017 O POS   Final  . Antibody Screen 07/05/2017 NEG   Final  . Sample Expiration 07/05/2017 07/13/2017   Final  . Extend sample reason 07/05/2017    Final                   Value:NO TRANSFUSIONS OR PREGNANCY IN THE PAST 3 MONTHS Performed at [facility], [place] [address], [place], [place] [address]   . MRSA, PCR 07/05/2017 NEGATIVE  NEGATIVE Final  . Staphylococcus aureus 07/05/2017 NEGATIVE  NEGATIVE Final   Comment: (NOTE) The Xpert SA Assay (FDA approved for NASAL specimens in patients 70 years of age and older), is one component of a comprehensive surveillance program. It is not intended to diagnose infection nor to guide or monitor treatment. Performed at [facility], [place] [address] [place], [place] [address]      X-Rays:No results found.  EKG: Orders placed or performed during the hospital  encounter of 12/28/16  . EKG 12 lead  . EKG 12 lead     Hospital Course: Gabriella Nguyen is a 62 y.o. who was admitted to [facility]. They were brought to the operating room on 07/10/2017 and underwent Procedure(s): LEFT TOTAL KNEE ARTHROPLASTY.  Patient tolerated the procedure well and was later transferred to the recovery room and then to the orthopaedic floor for postoperative care.  They were given PO and IV analgesics for  pain control following their surgery.  They were given 24 hours of postoperative antibiotics of  Anti-infectives (From admission, onward)   Start     Dose/Rate Route Frequency Ordered Stop   07/10/17 1400  ceFAZolin (ANCEF) IVPB 2g/100 mL premix     2 g 200 mL/hr over 30 Minutes Intravenous Every 6 hours 07/10/17 1135 07/10/17 2039   07/10/17 0615  ceFAZolin (ANCEF) IVPB 2g/100 mL premix     2 g 200 mL/hr over 30 Minutes Intravenous On call to O.R. 07/10/17 3810 07/10/17 0836     and started on DVT prophylaxis in the form of Xarelto.   PT and OT were ordered for total joint protocol.  Discharge planning consulted to help with postop disposition and equipment needs.  Patient had a decent night on the evening of surgery.  They started to get up OOB with therapy on day one and progressed with therapy by the second session. Hemovac drain was pulled without difficulty.  Dressing was clean and dry. Patient was seen in rounds on POD 1 and was ready to go home.   Diet - Cardiac diet Follow up - in 2 weeks Activity - WBAT Disposition - Home Condition Upon Discharge - pending therapy D/C Meds - See DC Summary DVT Prophylaxis - Xarelto     Discharge Instructions    Call MD / Call 911   Complete by:  As directed    If you experience chest pain or shortness of breath, CALL 911 and be transported to the hospital emergency room.  If you develope a fever above 101 F, pus (white drainage) or increased drainage or redness at the wound, or calf pain, call your surgeon's  office.   Change dressing   Complete by:  As directed    Change dressing daily with sterile 4 x 4 inch gauze dressing and apply TED hose. Do not submerge the incision under water.   Constipation Prevention   Complete by:  As directed    Drink plenty of fluids.  Prune juice may be helpful.  You may use a stool softener, such as Colace (over the counter) 100 mg twice a day.  Use MiraLax (over the counter) for constipation as needed.   Diet - low sodium heart healthy   Complete by:  As directed    Discharge instructions   Complete by:  As directed    Take Xarelto for two and a half more weeks, then discontinue Xarelto. Once the patient has completed the Xarelto, they may resume the 81 mg Aspirin.    Pick up stool softner and laxative for home use following surgery while on pain medications. Do not submerge incision under water. Please use good hand washing techniques while changing dressing each day. May shower starting three days after surgery. Please use a clean towel to pat the incision dry following showers. Continue to use ice for pain and swelling after surgery. Do not use any lotions or creams on the incision until instructed by your surgeon.  Wear both TED hose on both legs during the day every day for three weeks, but may remove the TED hose at night at home.  Postoperative Constipation Protocol  Constipation - defined medically as fewer than three stools per week and severe constipation as less than one stool per week.  One of the most common issues patients have following surgery is constipation.  Even if you have a regular bowel pattern at home, your normal regimen is likely to be disrupted due  to multiple reasons following surgery.  Combination of anesthesia, postoperative narcotics, change in appetite and fluid intake all can affect your bowels.  In order to avoid complications following surgery, here are some recommendations in order to help you during your recovery  period.  Colace (docusate) - Pick up an over-the-counter form of Colace or another stool softener and take twice a day as long as you are requiring postoperative pain medications.  Take with a full glass of water daily.  If you experience loose stools or diarrhea, hold the colace until you stool forms back up.  If your symptoms do not get better within 1 week or if they get worse, check with your doctor.  Dulcolax (bisacodyl) - Pick up over-the-counter and take as directed by the product packaging as needed to assist with the movement of your bowels.  Take with a full glass of water.  Use this product as needed if not relieved by Colace only.   MiraLax (polyethylene glycol) - Pick up over-the-counter to have on hand.  MiraLax is a solution that will increase the amount of water in your bowels to assist with bowel movements.  Take as directed and can mix with a glass of water, juice, soda, coffee, or tea.  Take if you go more than two days without a movement. Do not use MiraLax more than once per day. Call your doctor if you are still constipated or irregular after using this medication for 7 days in a row.  If you continue to have problems with postoperative constipation, please contact the office for further assistance and recommendations.  If you experience "the worst abdominal pain ever" or develop nausea or vomiting, please contact the office immediatly for further recommendations for treatment.   Do not put a pillow under the knee. Place it under the heel.   Complete by:  As directed    Do not sit on low chairs, stoools or toilet seats, as it may be difficult to get up from low surfaces   Complete by:  As directed    Driving restrictions   Complete by:  As directed    No driving until released by the physician.   Increase activity slowly as tolerated   Complete by:  As directed    Lifting restrictions   Complete by:  As directed    No lifting until released by the physician.   Patient may  shower   Complete by:  As directed    You may shower without a dressing once there is no drainage.  Do not wash over the wound.  If drainage remains, do not shower until drainage stops.   TED hose   Complete by:  As directed    Use stockings (TED hose) for 3 weeks on both leg(s).  You may remove them at night for sleeping.   Weight bearing as tolerated   Complete by:  As directed    Laterality:  left   Extremity:  Lower     Allergies as of 07/11/2017      Reactions   Nifedipine Swelling, Other (See Comments)   Swelling gums       Medication List    STOP taking these medications   aspirin EC 81 MG tablet   naproxen 250 MG tablet Commonly known as:  NAPROSYN   TURMERIC COMPLEX/BLACK PEPPER PO   Vitamin D3 2000 units Tabs     TAKE these medications   acetaminophen 500 MG tablet Commonly known as:  TYLENOL Take  1,000 mg by mouth every 6 (six) hours as needed for moderate pain or headache.   albuterol 90 MCG/ACT inhaler Commonly known as:  PROVENTIL,VENTOLIN Inhale 2 puffs into the lungs every 6 (six) hours as needed for wheezing or shortness of breath.   budesonide-formoterol 160-4.5 MCG/ACT inhaler Commonly known as:  SYMBICORT Inhale 1 puff into the lungs 2 (two) times daily.   carvedilol 12.5 MG tablet Commonly known as:  COREG Take 6.25 mg by mouth 2 (two) times daily with a meal.   gabapentin 300 MG capsule Commonly known as:  NEURONTIN Take 1 capsule (300 mg total) by mouth 3 (three) times daily. Gabapentin 300 mg Protocol Take a 300 mg capsule three times a day for two weeks, Then a 300 mg capsule twice a day for two weeks, Then a 300 mg capsule once a day for two weeks, then discontinue the Gabapentin.   losartan 100 MG tablet Commonly known as:  COZAAR TAKE 1 TABLET (100 MG TOTAL) BY MOUTH DAILY.   methocarbamol 500 MG tablet Commonly known as:  ROBAXIN Take 1 tablet (500 mg total) by mouth every 6 (six) hours as needed for muscle spasms.   oxyCODONE  5 MG immediate release tablet Commonly known as:  Oxy IR/ROXICODONE Take 1-2 tablets (5-10 mg total) by mouth every 4 (four) hours as needed for moderate pain or severe pain.   potassium chloride 10 MEQ tablet Commonly known as:  K-DUR,KLOR-CON Take 10 mEq by mouth daily.   rivaroxaban 10 MG Tabs tablet Commonly known as:  XARELTO Take 1 tablet (10 mg total) by mouth daily with breakfast. Take Xarelto for two and a half more weeks following discharge from the hospital, then discontinue Xarelto. Once the patient has completed the Xarelto, they may resume the 81 mg Aspirin.   spironolactone-hydrochlorothiazide 25-25 MG tablet Commonly known as:  ALDACTAZIDE Take 1 tablet by mouth daily.   traMADol 50 MG tablet Commonly known as:  ULTRAM Take 1-2 tablets (50-100 mg total) by mouth every 6 (six) hours as needed (mild pain).            Discharge Care Instructions  (From admission, onward)        Start     Ordered   07/11/17 0000  Weight bearing as tolerated    Question Answer Comment  Laterality left   Extremity Lower      07/11/17 0731   07/11/17 0000  Change dressing    Comments:  Change dressing daily with sterile 4 x 4 inch gauze dressing and apply TED hose. Do not submerge the incision under water.   07/11/17 0731     Follow-up Information    Gaynelle Arabian, MD. Schedule an appointment as soon as possible for a visit on 07/25/2017.   Specialty:  Orthopedic Surgery Contact information: [address] [place] [address] [phone]           Signed: Arlee Muslim, PA-C Orthopaedic Surgery 07/18/2017, 10:33 PM

## 2017-07-11 NOTE — Progress Notes (Signed)
Spoke with patient at bedside. Confirmed plan for OP PT, already arranged. Has DME. [phone] 

## 2017-07-11 NOTE — Progress Notes (Signed)
Physical Therapy Treatment Patient Details Name: Gabriella Nguyen MRN: [id number] DOB: [birth year] Today's Date: 07/11/2017    History of Present Illness s/p L TKA; PMHx: R TKR revision    PT Comments    Min A for bed mobility and transfers, Pt only able to ambulate 4' with RW 2* nausea. Performed TKA exercises with min assist. VSS at end of session.   Follow Up Recommendations  Follow surgeon's recommendation for DC plan and follow-up therapies     Equipment Recommendations  Rolling walker with 5" wheels    Recommendations for Other Services       Precautions / Restrictions Precautions Precautions: Knee;Fall Required Braces or Orthoses: Knee Immobilizer - Left Knee Immobilizer - Left: Discontinue once straight leg raise with < 10 degree lag Restrictions Weight Bearing Restrictions: No    Mobility  Bed Mobility Overal bed mobility: Needs Assistance Bed Mobility: Supine to Sit     Supine to sit: Min assist     General bed mobility comments: cues for technique, incr time, assist with LLE  Transfers Overall transfer level: Needs assistance Equipment used: Rolling walker (2 wheeled) Transfers: Sit to/from Stand Sit to Stand: Min assist;From elevated surface         General transfer comment: bed elevated significantly, assist to rise and stabilize  Ambulation/Gait Ambulation/Gait assistance: Min guard Ambulation Distance (Feet): 3 Feet Assistive device: Rolling walker (2 wheeled) Gait Pattern/deviations: Step-to pattern     General Gait Details: distance limited by nausea   Stairs             Wheelchair Mobility    Modified Rankin (Stroke Patients Only)       Balance                                            Cognition Arousal/Alertness: Awake/alert Behavior During Therapy: WFL for tasks assessed/performed Overall Cognitive Status: Within Functional Limits for tasks assessed                                         Exercises Total Joint Exercises Ankle Circles/Pumps: AROM;Both;10 reps Quad Sets: AROM;Both;10 reps Short Arc QuadBarbaraann Boys;Left;10 reps;Supine Heel Slides: AAROM;Left;10 reps;Supine Straight Leg Raises: AAROM;Left;10 reps;Supine Goniometric ROM: 0-40* AAROM L knee    General Comments        Pertinent Vitals/Pain Pain Score: 3  Pain Location: L knee Pain Descriptors / Indicators: Aching;Sore Pain Intervention(s): Limited activity within patient's tolerance;Monitored during session;Premedicated before session;Ice applied    Home Living                      Prior Function            PT Goals (current goals can now be found in the care plan section) Acute Rehab PT Goals Patient Stated Goal: home, less knee pain PT Goal Formulation: With patient Time For Goal Achievement: 07/17/17 Potential to Achieve Goals: Good Progress towards PT goals: Progressing toward goals    Frequency    7X/week      PT Plan Current plan remains appropriate    Co-evaluation              AM-PAC PT "6 Clicks" Daily Activity  Outcome Measure  Difficulty turning over in bed (including adjusting bedclothes,  sheets and blankets)?: A Lot Difficulty moving from lying on back to sitting on the side of the bed? : Unable Difficulty sitting down on and standing up from a chair with arms (e.g., wheelchair, bedside commode, etc,.)?: Unable Help needed moving to and from a bed to chair (including a wheelchair)?: A Little Help needed walking in hospital room?: A Little Help needed climbing 3-5 steps with a railing? : A Lot 6 Click Score: 12    End of Session Equipment Utilized During Treatment: Gait belt Activity Tolerance: Treatment limited secondary to medical complications (Comment)(nausea) Patient left: with call bell/phone within reach;in chair Nurse Communication: Mobility status;Other (comment)(nausea with activity) PT Visit Diagnosis: Difficulty in walking, not  elsewhere classified (R26.2)     Time: [id number] PT Time Calculation (min) (ACUTE ONLY): 28 min  Charges:  $Gait Training: 8-22 mins $Therapeutic Exercise: 8-22 mins                    G Codes:          Tamala SerUhlenberg, Forever Arechiga Kistler 07/11/2017, 11:17 AM [phone]

## 2017-07-11 NOTE — Progress Notes (Signed)
   Subjective: 1 Day Post-Op Procedure(s) (LRB): LEFT TOTAL KNEE ARTHROPLASTY (Left) Patient reports pain as mild.   Patient seen in rounds for Dr. Lequita HaltAluisio. Patient is well, but has had some minor complaints of pain in the knee, requiring pain medications We will start therapy today.  If they do well with therapy and meets all goals, then will allow home later this afternoon following therapy. Plan is to go Home after hospital stay.  Objective: Vital signs in last 24 hours: Temp:  [97.5 F (36.4 C)-98.1 F (36.7 C)] 97.8 F (36.6 C) (04/16 0550) Pulse Rate:  [67-94] 81 (04/16 0550) Resp:  [11-24] 18 (04/16 0550) BP: (96-145)/(62-100) 120/79 (04/16 0550) SpO2:  [97 %-100 %] 98 % (04/16 0550)  Intake/Output from previous day:  Intake/Output Summary (Last 24 hours) at 07/11/2017 0723 Last data filed at 07/11/2017 0600 Gross per 24 hour  Intake 5996.43 ml  Output 3655 ml  Net 2341.43 ml    Intake/Output this shift: No intake/output data recorded.  Labs: Recent Labs    07/11/17 0534  HGB 10.8*   Recent Labs    07/11/17 0534  WBC 9.0  RBC 3.42*  HCT 31.9*  PLT 201   Recent Labs    07/11/17 0534  NA 137  K 3.9  CL 104  CO2 23  BUN 9  CREATININE 0.60  GLUCOSE 118*  CALCIUM 9.0   No results for input(s): LABPT, INR in the last 72 hours.  EXAM General - Patient is Alert, Appropriate and Oriented Extremity - Neurovascular intact Sensation intact distally Intact pulses distally Dressing - dressing C/D/I Motor Function - intact, moving foot and toes well on exam.  Hemovac pulled without difficulty.  Past Medical History:  Diagnosis Date  . Aspiration pneumonia (HCC) 06/2011 hosp  . ASTHMA   . OSTEOARTHRITIS, KNEES, BILATERAL   . Unspecified essential hypertension     Assessment/Plan: 1 Day Post-Op Procedure(s) (LRB): LEFT TOTAL KNEE ARTHROPLASTY (Left) Principal Problem:   OA (osteoarthritis) of knee  Estimated body mass index is 29.29 kg/m as  calculated from the following:   Height as of this encounter: 6' (1.829 m).   Weight as of this encounter: 98 kg (216 lb). Up with therapy Plan for discharge later today  DVT Prophylaxis - Xarelto Weight-Bearing as tolerated to left leg D/C O2 and Pulse OX and try on Room Air  If meets goals and able to go home: Up with therapy Diet - Cardiac diet Follow up - in 2 weeks Activity - WBAT Disposition - Home Condition Upon Discharge - pending therapy D/C Meds - See DC Summary DVT Prophylaxis - Xarelto  Avel Peacerew Marylon Verno, PA-C Orthopaedic Surgery

## 2018-05-28 ENCOUNTER — Encounter: Payer: Non-veteran care | Admitting: Family

## 2018-06-05 ENCOUNTER — Encounter: Payer: Self-pay | Admitting: Family

## 2018-06-05 ENCOUNTER — Ambulatory Visit (INDEPENDENT_AMBULATORY_CARE_PROVIDER_SITE_OTHER): Payer: Medicare HMO | Admitting: Family

## 2018-06-05 VITALS — BP 110/70 | HR 78 | Temp 98.7°F | Ht 72.0 in | Wt 205.1 lb

## 2018-06-05 DIAGNOSIS — J4530 Mild persistent asthma, uncomplicated: Secondary | ICD-10-CM

## 2018-06-05 DIAGNOSIS — I10 Essential (primary) hypertension: Secondary | ICD-10-CM

## 2018-06-05 MED ORDER — POTASSIUM CHLORIDE CRYS ER 10 MEQ PO TBCR: 10.0000 meq | EXTENDED_RELEASE_TABLET | Freq: Every day | ORAL | 3 refills | Status: DC

## 2018-06-05 MED ORDER — LOSARTAN POTASSIUM 100 MG PO TABS: ORAL_TABLET | ORAL | 3 refills | Status: DC

## 2018-06-05 MED ORDER — CARVEDILOL 12.5 MG PO TABS: 6.2500 mg | ORAL_TABLET | Freq: Two times a day (BID) | ORAL | 3 refills | Status: DC

## 2018-06-05 MED ORDER — SPIRONOLACTONE-HCTZ 25-25 MG PO TABS: 1.0000 | ORAL_TABLET | Freq: Every day | ORAL | 3 refills | Status: DC

## 2018-06-05 NOTE — Progress Notes (Signed)
Gabriella Nguyen is a 63 y.o. female with the following history as recorded in EpicCare:  Patient Active Problem List   Diagnosis Date Noted  . OA (osteoarthritis) of knee 07/10/2017  . 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 Skin lesion 10/11/2016  . Encounter for general adult medical examination with abnormal findings 08/30/2016  . Contusion, hip 06/12/2015  . Shoulder contusion 06/12/2015  . Cough 08/22/2014  . Mild concussion 04/21/2014  . Candida vaginitis 01/24/2014  . Aspiration pneumonia (HCC) 07/25/2011  . Weakness generalized 07/25/2011  . Essential hypertension 04/28/2010  . BACTERIAL PNEUMONIA 04/28/2010  . Asthma 04/28/2010  . Osteoarthrosis, unspecified whether generalized or localized, involving lower leg 04/28/2010    Current Outpatient Medications  Medication Sig Dispense Refill  . acetaminophen (TYLENOL) 500 MG tablet Take 1,000 mg by mouth every 6 (six) hours as needed for moderate pain or headache.    . albuterol (PROVENTIL,VENTOLIN) 90 MCG/ACT inhaler Inhale 2 puffs into the lungs every 6 (six) hours as needed for wheezing or shortness of breath.     . budesonide-formoterol (SYMBICORT) 160-4.5 MCG/ACT inhaler Inhale 1 puff into the lungs 2 (two) times daily.     . carvedilol (COREG) 12.5 MG tablet Take 0.5 tablets (6.25 mg total) by mouth 2 (two) times daily with a meal. 90 tablet 3  . losartan (COZAAR) 100 MG tablet TAKE 1 TABLET (100 MG TOTAL) BY MOUTH DAILY. 90 tablet 3  . potassium chloride (K-DUR,KLOR-CON) 10 MEQ tablet Take 1 tablet (10 mEq total) by mouth daily. 90 tablet 3  . spironolactone-hydrochlorothiazide (ALDACTAZIDE) 25-25 MG tablet Take 1 tablet by mouth daily. 90 tablet 3   No current facility-administered medications for this visit.     Allergies: Nifedipine  Past Medical History:  Diagnosis Date  . Aspiration pneumonia (HCC) 06/2011 hosp  . ASTHMA   . OSTEOARTHRITIS, KNEES, BILATERAL   . Unspecified essential hypertension      Past Surgical History:  Procedure Laterality Date  . CESAREAN SECTION  1989  . hand surgery-right Right 1990   at base of right thumb  . JOINT REPLACEMENT    . Right total knee  10/20/2015   done by Dr Norm Salt at the [facility] hospital   . TONSILLECTOMY     at age 55  . TOTAL KNEE ARTHROPLASTY Left 07/10/2017   Procedure: LEFT TOTAL KNEE ARTHROPLASTY;  Surgeon: Ollen Gross, MD;  Location: [facility];  Service: Orthopedics;  Laterality: Left;  . TOTAL KNEE ARTHROPLASTY WITH REVISION COMPONENTS Right 01/04/2017   Procedure: Right knee total knee arthroplasty revision;  Surgeon: Ollen Gross, MD;  Location: [facility];  Service: Orthopedics;  Laterality: Right;    Family History  Problem Relation Age of Onset  . Dementia Mother   . Hypertension Mother   . Heart disease Father   . COPD Father     Social History   Tobacco Use  . Smoking status: Former Smoker    Years: 2.00    Types: Cigarettes    Start date: 08/23/1984    Last attempt to quit: 1986    Years since quitting: 34.2  . Smokeless tobacco: Never Used  Substance Use Topics  . Alcohol use: Yes    Alcohol/week: 2.0 standard drinks    Types: 2 Standard drinks or equivalent per week    Comment: beer or wine    Subjective:  Presents to transfer care today; has not been seen her in almost 2 years; has been having majority of her care  managed through the [place];  History of hypertension/ asthma- stable; labs done in the past 4-5 months;  Per patient, up to date on pap smear, colonoscopy, mammogram and labs- she will plan to get copies of those records for review.   Objective:  Vitals:   06/05/18 1055  BP: 110/70  Pulse: 78  Temp: 98.7 F (37.1 C)  TempSrc: Oral  SpO2: 96%  Weight: 205 lb 1.9 oz (93 kg)  Height: 6' (1.829 m)    General: Well developed, well nourished, in no acute distress  Skin : Warm and dry.  Head: Normocephalic and atraumatic  Eyes: Sclera and conjunctiva clear; pupils round and reactive to light;  extraocular movements intact  Ears: External normal; canals clear; tympanic membranes normal  Oropharynx: Pink, supple. No suspicious lesions  Neck: Supple without thyromegaly, adenopathy  Lungs: Respirations unlabored; clear to auscultation bilaterally without wheeze, rales, rhonchi  CVS exam: normal rate and regular rhythm.  Neurologic: Alert and oriented; speech intact; face symmetrical; moves all extremities well; CNII-XII intact without focal deficit   Assessment:  1. Essential hypertension   2. Mild persistent asthma without complication     Plan:  1. Stable; refills updated; 2. Stable; refills updated; she plans to get her Symbicort and albuterol through the [place];  Follow-up for AWV in 2 months- needs to bring [place] records with her at that time.  No follow-ups on file.  No orders of the defined types were placed in this encounter.   Requested Prescriptions   Signed Prescriptions Disp Refills  . carvedilol (COREG) 12.5 MG tablet 90 tablet 3    Sig: Take 0.5 tablets (6.25 mg total) by mouth 2 (two) times daily with a meal.  . losartan (COZAAR) 100 MG tablet 90 tablet 3    Sig: TAKE 1 TABLET (100 MG TOTAL) BY MOUTH DAILY.  Marland Kitchen potassium chloride (K-DUR,KLOR-CON) 10 MEQ tablet 90 tablet 3    Sig: Take 1 tablet (10 mEq total) by mouth daily.  Marland Kitchen spironolactone-hydrochlorothiazide (ALDACTAZIDE) 25-25 MG tablet 90 tablet 3    Sig: Take 1 tablet by mouth daily.

## 2018-06-05 NOTE — Patient Instructions (Signed)
Please get [place] copies of your preventive health care needs from the [place]- We need to get information for your pap smear, mammogram, colonoscopy, labs and vaccine status.

## 2018-06-27 ENCOUNTER — Telehealth: Payer: Self-pay | Admitting: Family

## 2018-06-27 NOTE — Telephone Encounter (Signed)
Patient left voicemail message requesting to called back but did not clear message of what call was regarding. Returned call to patient but no answer at this time. Left message on voicemail for pt to return call to office.

## 2018-06-27 NOTE — Telephone Encounter (Signed)
Not sure what her phone-call was in reference to as I have not called patient for any reason.

## 2018-06-28 ENCOUNTER — Telehealth: Payer: Self-pay | Admitting: Family

## 2018-06-28 NOTE — Telephone Encounter (Signed)
Pt and l/m about a called that she said that received on yesterday about a medication.

## 2018-06-28 NOTE — Telephone Encounter (Signed)
See previous phone note, duplicate  

## 2018-06-28 NOTE — Telephone Encounter (Signed)
Pt called stating that she spoke to Vernona Rieger about a on and off abdominal pain and she would like to know if it could be her spironolactone-HCTZ she read the side effects on the insert of the medication   I told her those side effects depend on other factors, but it could it could not, I asked if these were happening before she started the medication and she said I dont believe so. She said the pain is mostly when she is leaning over or doing something. She had a Colonoscopy and it was Negative.   Please advise and call back.

## 2018-06-29 NOTE — Telephone Encounter (Signed)
Called and left message for patient regarding info. 

## 2018-06-29 NOTE — Telephone Encounter (Signed)
Unfortunately, we have only met her once here and she did not mention anything related to abdominal pain. I am wondering if she has talked to her PCP at the [place] about this. Because they have been treating her for the past few years, I would actually recommend that she reach out to that provider right now.  My initial thought is that the blood pressure medication is not the source of any abdominal pain.

## 2018-06-30 NOTE — Telephone Encounter (Signed)
Patient returned call

## 2018-08-08 ENCOUNTER — Ambulatory Visit: Payer: Medicare HMO

## 2018-08-22 ENCOUNTER — Telehealth: Payer: Self-pay | Admitting: Family

## 2018-08-22 NOTE — Telephone Encounter (Signed)
Called and left message for patient today 

## 2018-08-22 NOTE — Telephone Encounter (Signed)
Pt left a voicemail asking if she could take an allergy pill (Zyrtec) on a regular basis without if affecting any of the other medicine she is on?  Please advise

## 2018-08-22 NOTE — Telephone Encounter (Signed)
Yes, I am fine with her using Zyrtec on a regular basis. If she feels that she is chronically congested, she should also consider adding Flonase.

## 2018-11-19 ENCOUNTER — Telehealth: Payer: Self-pay | Admitting: Family

## 2018-11-19 MED ORDER — BUDESONIDE-FORMOTEROL FUMARATE 160-4.5 MCG/ACT IN AERO: 1.0000 | INHALATION_SPRAY | Freq: Two times a day (BID) | RESPIRATORY_TRACT | 3 refills | Status: DC

## 2018-11-19 MED ORDER — ALBUTEROL SULFATE HFA 108 (90 BASE) MCG/ACT IN AERS: 2.0000 | INHALATION_SPRAY | Freq: Four times a day (QID) | RESPIRATORY_TRACT | 2 refills | Status: DC | PRN

## 2018-11-19 NOTE — Telephone Encounter (Signed)
Please make sure the patient is aware that Symbicort is not generic. Refills are updated;

## 2018-11-19 NOTE — Telephone Encounter (Signed)
Patient stated she would like all of her prescriptions sent to the [place] and also said her pharmacy stated that Ruthe Mannan is the same as Symbicort. If the provider say this is an okay substitution she would like that prescription sent to [facility].

## 2018-11-19 NOTE — Telephone Encounter (Signed)
Pt is needing her albuterol and symbicort refilled but needs it changed to the generic name brand so that medicare will cover. Please advise  Pt call back # [phone]

## 2018-11-19 NOTE — Telephone Encounter (Signed)
Called and left message for patient.

## 2018-11-20 ENCOUNTER — Other Ambulatory Visit: Payer: Self-pay | Admitting: Family

## 2018-11-20 MED ORDER — DULERA 200-5 MCG/ACT IN AERO: 2.0000 | INHALATION_SPRAY | Freq: Two times a day (BID) | RESPIRATORY_TRACT | 2 refills | Status: DC

## 2018-11-20 MED ORDER — ALBUTEROL SULFATE HFA 108 (90 BASE) MCG/ACT IN AERS: 2.0000 | INHALATION_SPRAY | Freq: Four times a day (QID) | RESPIRATORY_TRACT | 2 refills | Status: AC | PRN

## 2018-11-28 DIAGNOSIS — H5213 Myopia, bilateral: Secondary | ICD-10-CM | POA: Diagnosis not present

## 2018-12-14 ENCOUNTER — Encounter: Payer: Self-pay | Admitting: Family

## 2018-12-14 ENCOUNTER — Other Ambulatory Visit (INDEPENDENT_AMBULATORY_CARE_PROVIDER_SITE_OTHER): Payer: Medicare HMO

## 2018-12-14 ENCOUNTER — Other Ambulatory Visit: Payer: Self-pay

## 2018-12-14 ENCOUNTER — Ambulatory Visit (INDEPENDENT_AMBULATORY_CARE_PROVIDER_SITE_OTHER): Payer: Medicare HMO | Admitting: Family

## 2018-12-14 VITALS — BP 126/80 | HR 85 | Temp 98.3°F | Ht 72.0 in | Wt 194.0 lb

## 2018-12-14 DIAGNOSIS — R109 Unspecified abdominal pain: Secondary | ICD-10-CM

## 2018-12-14 DIAGNOSIS — R195 Other fecal abnormalities: Secondary | ICD-10-CM

## 2018-12-14 DIAGNOSIS — Z23 Encounter for immunization: Secondary | ICD-10-CM | POA: Diagnosis not present

## 2018-12-14 DIAGNOSIS — R634 Abnormal weight loss: Secondary | ICD-10-CM

## 2018-12-14 DIAGNOSIS — D509 Iron deficiency anemia, unspecified: Secondary | ICD-10-CM | POA: Diagnosis not present

## 2018-12-14 LAB — CBC WITH DIFFERENTIAL/PLATELET
Basophils Absolute: 0 10*3/uL (ref 0.0–0.1)
Basophils Relative: 0.8 % (ref 0.0–3.0)
Eosinophils Absolute: 0.2 10*3/uL (ref 0.0–0.7)
Eosinophils Relative: 3.4 % (ref 0.0–5.0)
HCT: 32.2 % — ABNORMAL LOW (ref 36.0–46.0)
Hemoglobin: 11.1 g/dL — ABNORMAL LOW (ref 12.0–15.0)
Lymphocytes Relative: 23.5 % (ref 12.0–46.0)
Lymphs Abs: 1.4 10*3/uL (ref 0.7–4.0)
MCHC: 34.4 g/dL (ref 30.0–36.0)
MCV: 94.2 fl (ref 78.0–100.0)
Monocytes Absolute: 0.4 10*3/uL (ref 0.1–1.0)
Monocytes Relative: 7.5 % (ref 3.0–12.0)
Neutro Abs: 3.9 10*3/uL (ref 1.4–7.7)
Neutrophils Relative %: 64.8 % (ref 43.0–77.0)
Platelets: 259 10*3/uL (ref 150.0–400.0)
RBC: 3.42 Mil/uL — ABNORMAL LOW (ref 3.87–5.11)
RDW: 12.3 % (ref 11.5–15.5)
WBC: 6 10*3/uL (ref 4.0–10.5)

## 2018-12-14 LAB — COMPREHENSIVE METABOLIC PANEL
ALT: 11 U/L (ref 0–35)
AST: 16 U/L (ref 0–37)
Albumin: 3.9 g/dL (ref 3.5–5.2)
Alkaline Phosphatase: 59 U/L (ref 39–117)
BUN: 17 mg/dL (ref 6–23)
CO2: 29 mEq/L (ref 19–32)
Calcium: 9.7 mg/dL (ref 8.4–10.5)
Chloride: 101 mEq/L (ref 96–112)
Creatinine, Ser: 0.78 mg/dL (ref 0.40–1.20)
GFR: 89.86 mL/min (ref >60.00)
Glucose, Bld: 104 mg/dL — ABNORMAL HIGH (ref 70–99)
Potassium: 4.1 mEq/L (ref 3.5–5.1)
Sodium: 136 mEq/L (ref 135–145)
Total Bilirubin: 0.4 mg/dL (ref 0.2–1.2)
Total Protein: 6.9 g/dL (ref 6.0–8.3)

## 2018-12-14 LAB — LIPASE: Lipase: 31 U/L (ref 11.0–59.0)

## 2018-12-14 LAB — AMYLASE: Amylase: 49 U/L (ref 27–131)

## 2018-12-14 LAB — TSH: TSH: 0.81 u[IU]/mL (ref 0.35–4.50)

## 2018-12-14 LAB — VITAMIN B12: Vitamin B-12: 666 pg/mL (ref 211–911)

## 2018-12-14 NOTE — Progress Notes (Signed)
Gabriella Nguyen is a 63 y.o. female with the following history as recorded in EpicCare:  Patient Active Problem List   Diagnosis Date Noted  . OA (osteoarthritis) of knee 07/10/2017  . Failed total knee arthroplasty ([place]) 01/04/2017  . Skin lesion 10/11/2016  . Encounter for general adult medical examination with abnormal findings 08/30/2016  . Contusion, hip 06/12/2015  . Shoulder contusion 06/12/2015  . Cough 08/22/2014  . Mild concussion 04/21/2014  . Candida vaginitis 01/24/2014  . Aspiration pneumonia ([place]) 07/25/2011  . Weakness generalized 07/25/2011  . Essential hypertension 04/28/2010  . BACTERIAL PNEUMONIA 04/28/2010  . Asthma 04/28/2010  . Osteoarthrosis, unspecified whether generalized or localized, involving lower leg 04/28/2010    Current Outpatient Medications  Medication Sig Dispense Refill  . acetaminophen (TYLENOL) 500 MG tablet Take 1,000 mg by mouth every 6 (six) hours as needed for moderate pain or headache.    . albuterol (PROVENTIL,VENTOLIN) 90 MCG/ACT inhaler Inhale 2 puffs into the lungs every 6 (six) hours as needed for wheezing or shortness of breath.     Marland Kitchen albuterol (VENTOLIN HFA) 108 (90 Base) MCG/ACT inhaler Inhale 2 puffs into the lungs every 6 (six) hours as needed for wheezing or shortness of breath. 6.7 g 2  . carvedilol (COREG) 12.5 MG tablet Take 0.5 tablets (6.25 mg total) by mouth 2 (two) times daily with a meal. 90 tablet 3  . losartan (COZAAR) 100 MG tablet TAKE 1 TABLET (100 MG TOTAL) BY MOUTH DAILY. 90 tablet 3  . mometasone-formoterol (DULERA) 200-5 MCG/ACT AERO Inhale 2 puffs into the lungs 2 (two) times daily. 8.8 g 2  . potassium chloride (K-DUR,KLOR-CON) 10 MEQ tablet Take 1 tablet (10 mEq total) by mouth daily. 90 tablet 3  . spironolactone-hydrochlorothiazide (ALDACTAZIDE) 25-25 MG tablet Take 1 tablet by mouth daily. 90 tablet 3   No current facility-administered medications for this visit.     Allergies: Nifedipine  Past Medical  History:  Diagnosis Date  . Aspiration pneumonia ([place]) 06/2011 hosp  . ASTHMA   . OSTEOARTHRITIS, KNEES, BILATERAL   . Unspecified essential hypertension     Past Surgical History:  Procedure Laterality Date  . [place] Right 1990   at base of right thumb  . JOINT REPLACEMENT    . Right total knee  10/20/2015   done by Dr Eilene Ghazi at the [facility] hospital   . TONSILLECTOMY     at age 57  . TOTAL KNEE ARTHROPLASTY Left 07/10/2017   Procedure: LEFT TOTAL KNEE ARTHROPLASTY;  Surgeon: Gaynelle Arabian, MD;  Location: [facility];  Service: Orthopedics;  Laterality: Left;  . TOTAL KNEE ARTHROPLASTY WITH REVISION COMPONENTS Right 01/04/2017   Procedure: Right knee total knee arthroplasty revision;  Surgeon: Gaynelle Arabian, MD;  Location: [facility];  Service: Orthopedics;  Laterality: Right;    Family History  Problem Relation Age of Onset  . Dementia Mother   . Hypertension Mother   . Heart disease Father   . COPD Father     Social History   Tobacco Use  . Smoking status: Former Smoker    Years: 2.00    Types: Cigarettes    Start date: 08/23/1984    Quit date: 1986    Years since quitting: 34.7  . Smokeless tobacco: Never Used  Substance Use Topics  . Alcohol use: Yes    Alcohol/week: 2.0 standard drinks    Types: 2 Standard drinks or equivalent per week    Comment:  beer or wine    Subjective:  Patient notes that she had an episode on Sunday of "just not feeling well"- admits that she drank increased amount of alcohol with friends on Saturday; no change in bowel movements, no vomiting, no fever; Notes that by Wednesday- "everthing seemed good"; "just wanted to get checked out to make sure everything was okay." did notice that her stool looked dark earlier this week but had been eating increased amounts of muscadine grapes. Has lost 9 pounds since OV- per patient, planned weight loss;   Objective:  Vitals:   12/14/18 0953  BP: 126/80  Pulse: 85   Temp: 98.3 F (36.8 C)  TempSrc: Oral  SpO2: 98%  Weight: 194 lb 0.6 oz (88 kg)  Height: 6' (1.829 m)    General: Well developed, well nourished, in no acute distress  Skin : Warm and dry.  Head: Normocephalic and atraumatic  Eyes: Sclera and conjunctiva clear; pupils round and reactive to light; extraocular movements intact  Ears: External normal; canals clear; tympanic membranes normal  Oropharynx: Pink, supple. No suspicious lesions  Neck: Supple without thyromegaly, adenopathy  Lungs: Respirations unlabored; clear to auscultation bilaterally without wheeze, rales, rhonchi  CVS exam: normal rate and regular rhythm.  Abdomen: Soft; nontender; nondistended; normoactive bowel sounds; no masses or hepatosplenomegaly  Neurologic: Alert and oriented; speech intact; face symmetrical; moves all extremities well; CNII-XII intact without focal deficit   Assessment:  1. Abdominal pain, unspecified abdominal location   2. Weight loss   3. Dark stools     Plan:  Patient is symptom free at this time- per patient, doing much better; suspect she was dehydrated from alcohol earlier this week; will update labs for reassurance however; follow up to be determined. Flu shot given.   No follow-ups on file.  Orders Placed This Encounter  Procedures  . CBC w/Diff    Standing Status:   Future    Standing Expiration Date:   12/14/2019  . Comp Met (CMET)    Standing Status:   Future    Standing Expiration Date:   12/14/2019  . Amylase    Standing Status:   Future    Standing Expiration Date:   12/14/2019  . Lipase    Standing Status:   Future    Standing Expiration Date:   12/14/2019  . TSH    Standing Status:   Future    Standing Expiration Date:   12/14/2019  . B12    Standing Status:   Future    Standing Expiration Date:   12/14/2019  . Iron, TIBC and Ferritin Panel    Standing Status:   Future    Standing Expiration Date:   12/14/2019    Requested Prescriptions    No prescriptions  requested or ordered in this encounter

## 2018-12-15 LAB — IRON,TIBC AND FERRITIN PANEL
%SAT: 15 % (calc) — ABNORMAL LOW (ref 16–45)
Ferritin: 45 ng/mL (ref 16–288)
Iron: 49 ug/dL (ref 45–160)
TIBC: 317 mcg/dL (calc) (ref 250–450)

## 2018-12-31 ENCOUNTER — Other Ambulatory Visit: Payer: Medicare HMO

## 2018-12-31 ENCOUNTER — Encounter: Payer: Self-pay | Admitting: Internal Medicine

## 2018-12-31 ENCOUNTER — Other Ambulatory Visit: Payer: Self-pay

## 2018-12-31 ENCOUNTER — Ambulatory Visit (INDEPENDENT_AMBULATORY_CARE_PROVIDER_SITE_OTHER): Payer: Medicare HMO | Admitting: Internal Medicine

## 2018-12-31 VITALS — BP 118/64 | HR 100 | Temp 98.5°F | Resp 16 | Ht 72.0 in | Wt 201.8 lb

## 2018-12-31 DIAGNOSIS — N3000 Acute cystitis without hematuria: Secondary | ICD-10-CM | POA: Diagnosis not present

## 2018-12-31 DIAGNOSIS — R350 Frequency of micturition: Secondary | ICD-10-CM | POA: Diagnosis not present

## 2018-12-31 LAB — POCT URINALYSIS DIPSTICK
Blood, UA: NEGATIVE
Glucose, UA: POSITIVE — AB
Nitrite, UA: POSITIVE
Protein, UA: POSITIVE — AB
Spec Grav, UA: 1.02 (ref 1.010–1.025)
Urobilinogen, UA: >=8 E.U./dL — AB
pH, UA: 6 (ref 5.0–8.0)

## 2018-12-31 MED ORDER — FLUCONAZOLE 150 MG PO TABS: 150.0000 mg | ORAL_TABLET | Freq: Once | ORAL | 0 refills | Status: AC

## 2018-12-31 MED ORDER — AMOXICILLIN-POT CLAVULANATE 875-125 MG PO TABS: 1.0000 | ORAL_TABLET | Freq: Two times a day (BID) | ORAL | 0 refills | Status: DC

## 2018-12-31 NOTE — Progress Notes (Signed)
Subjective:    Patient ID: Gabriella CarnesRoberta W Nguyen, female    DOB: 08/06/1955, 63 y.o.   MRN: [id number]  HPI The patient is here for an acute visit for possible UTI.  Her symptoms started 5 days ago.  She tried to drink a lot of water 4 days ago and then started cranberry juice 3 days ago.  Her symptoms continued and she was advised by a nurse here to take Azo over the weekend, which did help her get through the weekend.  She has been experiencing increased urinary frequency and urgency.  She has some back pain across her lower back.  The symptoms are similar to what she is experienced in the past with her urinary tract infections.  She denies any fevers, chills, dysuria, hematuria, urine odor, abdominal pain or nausea.  It has been a while since her last UTI.   Medications and allergies reviewed with patient and updated if appropriate.  Patient Active Problem List   Diagnosis Date Noted  . OA (osteoarthritis) of knee 07/10/2017  . 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 Skin lesion 10/11/2016  . Encounter for general adult medical examination with abnormal findings 08/30/2016  . Contusion, hip 06/12/2015  . Shoulder contusion 06/12/2015  . Cough 08/22/2014  . Mild concussion 04/21/2014  . Candida vaginitis 01/24/2014  . Aspiration pneumonia (HCC) 07/25/2011  . Weakness generalized 07/25/2011  . Essential hypertension 04/28/2010  . BACTERIAL PNEUMONIA 04/28/2010  . Asthma 04/28/2010  . Osteoarthrosis, unspecified whether generalized or localized, involving lower leg 04/28/2010    Current Outpatient Medications on File Prior to Visit  Medication Sig Dispense Refill  . acetaminophen (TYLENOL) 500 MG tablet Take 1,000 mg by mouth every 6 (six) hours as needed for moderate pain or headache.    . albuterol (PROVENTIL,VENTOLIN) 90 MCG/ACT inhaler Inhale 2 puffs into the lungs every 6 (six) hours as needed for wheezing or shortness of breath.     Marland Kitchen. albuterol (VENTOLIN HFA)  108 (90 Base) MCG/ACT inhaler Inhale 2 puffs into the lungs every 6 (six) hours as needed for wheezing or shortness of breath. 6.7 g 2  . carvedilol (COREG) 12.5 MG tablet Take 0.5 tablets (6.25 mg total) by mouth 2 (two) times daily with a meal. 90 tablet 3  . losartan (COZAAR) 100 MG tablet TAKE 1 TABLET (100 MG TOTAL) BY MOUTH DAILY. 90 tablet 3  . mometasone-formoterol (DULERA) 200-5 MCG/ACT AERO Inhale 2 puffs into the lungs 2 (two) times daily. 8.8 g 2  . potassium chloride (K-DUR,KLOR-CON) 10 MEQ tablet Take 1 tablet (10 mEq total) by mouth daily. 90 tablet 3  . spironolactone-hydrochlorothiazide (ALDACTAZIDE) 25-25 MG tablet Take 1 tablet by mouth daily. 90 tablet 3   No current facility-administered medications on file prior to visit.     Past Medical History:  Diagnosis Date  . Aspiration pneumonia (HCC) 06/2011 hosp  . ASTHMA   . OSTEOARTHRITIS, KNEES, BILATERAL   . Unspecified essential hypertension     Past Surgical History:  Procedure Laterality Date  . CESAREAN SECTION  1989  . hand surgery-right Right 1990   at base of right thumb  . JOINT REPLACEMENT    . Right total knee  10/20/2015   [facility] Norm [facility]   . TONSILLECTOMY     at age 63  . TOTAL KNEE ARTHROPLASTY Left 07/10/2017   Procedure: LEFT TOTAL KNEE ARTHROPLASTY;  Surgeon: Ollen GrossAluisio, Frank, MD;  Location: [facility];  Service:  Orthopedics;  Laterality: Left;  . TOTAL KNEE ARTHROPLASTY WITH REVISION COMPONENTS Right 01/04/2017   Procedure: Right knee total knee arthroplasty revision;  Surgeon: Gaynelle Arabian, MD;  Location: [facility];  Service: Orthopedics;  Laterality: Right;    Social History   Socioeconomic History  . Marital status: Single    Spouse name: Not on file  . Number of children: 1  . Years of education: 50  . Highest education level: Not on file  Occupational History  . Occupation: International aid/development worker: [employer]  . Financial resource strain:  Not on file  . Food insecurity    Worry: Not on file    Inability: Not on file  . Transportation needs    Medical: Not on file    Non-medical: Not on file  Tobacco Use  . Smoking status: Former Smoker    Years: 2.00    Types: Cigarettes    Start date: 08/23/1984    Quit date: 1986    Years since quitting: 34.7  . Smokeless tobacco: Never Used  Substance and Sexual Activity  . Alcohol use: Yes    Alcohol/week: 2.0 standard drinks    Types: 2 Standard drinks or equivalent per week    Comment: beer or wine  . Drug use: No  . Sexual activity: Not on file  Lifestyle  . Physical activity    Days per week: Not on file    Minutes per session: Not on file  . Stress: Not on file  Relationships  . Social Herbalist on phone: Not on file    Gets together: Not on file    Attends religious service: Not on file    Active member of club or organization: Not on file    Attends meetings of clubs or organizations: Not on file    Relationship status: Not on file  Other Topics Concern  . Not on file  Social History Narrative   HSG, 2 years college. Married '85 -divorced '95. Son '89. Work - [employer]. Lives with son and SO.   Fun: Lac[place] Axon, travel ([place] ideal).    Denies any religious beliefs effecting health care.     Family History  Problem Relation Age of Onset  . Dementia Mother   . Hypertension Mother   . Heart disease Father   . COPD Father     Review of Systems  Constitutional: Negative for chills and fever.  Gastrointestinal: Negative for abdominal pain and nausea.  Genitourinary: Positive for frequency and urgency. Negative for dysuria and hematuria.       No urine smell or change in color  Musculoskeletal: Positive for back pain (lower back).       Objective:   Vitals:   12/31/18 1426  BP: 118/64  Pulse: 100  Resp: 16  Temp: 98.5 F (36.9 C)  SpO2: 98%   BP Readings from Last 3 Encounters:  12/31/18 118/64  12/14/18 126/80  06/05/18 110/70   Wt  Readings from Last 3 Encounters:  12/31/18 201 lb 12.8 oz (91.5 kg)  12/14/18 194 lb 0.6 oz (88 kg)  06/05/18 205 lb 1.9 oz (93 kg)   Body mass index is 27.37 kg/m.   Physical Exam Constitutional:      General: She is not in acute distress.    Appearance: Normal appearance. She is not ill-appearing.  HENT:     Head: Normocephalic and atraumatic.  Abdominal:     Palpations: Abdomen is  soft.     Tenderness: There is no abdominal tenderness. There is no right CVA tenderness, left CVA tenderness, guarding or rebound.  Musculoskeletal:        General: Tenderness (Mild tenderness with palpation across lower back-just achy) present.  Skin:    General: Skin is warm and dry.  Neurological:     Mental Status: She is alert.            Assessment & Plan:    See Problem List for Assessment and Plan of chronic medical problems.

## 2018-12-31 NOTE — Assessment & Plan Note (Signed)
Urine dip consistent with UTI Will send urine for culture Take the antibiotic as prescribed-Augmentin.  If she develops yeast infection, which she has in the past I have sent in a Diflucan 150 mg pill and she will take that at the end of the antibiotics if needed.   Take tylenol if needed. Can continue Azo Increase your water intake.  Call if no improvement

## 2018-12-31 NOTE — Patient Instructions (Addendum)
Take the antibiotic as prescribed.  Take tylenol if needed.  You can continue AZO.    Increase your water intake.   Call if no improvement    Take the diflucan if needed for a yeast infection after finishing the antibiotic.     Urinary Tract Infection, Adult A urinary tract infection (UTI) is an infection of any part of the urinary tract, which includes the kidneys, ureters, bladder, and urethra. These organs make, store, and get rid of urine in the body. UTI can be a bladder infection (cystitis) or kidney infection (pyelonephritis). What are the causes? This infection may be caused by fungi, viruses, or bacteria. Bacteria are the most common cause of UTIs. This condition can also be caused by repeated incomplete emptying of the bladder during urination. What increases the risk? This condition is more likely to develop if:  You ignore your need to urinate or hold urine for long periods of time.  You do not empty your bladder completely during urination.  You wipe back to front after urinating or having a bowel movement, if you are female.  You are uncircumcised, if you are female.  You are constipated.  You have a urinary catheter that stays in place (indwelling).  You have a weak defense (immune) system.  You have a medical condition that affects your bowels, kidneys, or bladder.  You have diabetes.  You take antibiotic medicines frequently or for long periods of time, and the antibiotics no longer work well against certain types of infections (antibiotic resistance).  You take medicines that irritate your urinary tract.  You are exposed to chemicals that irritate your urinary tract.  You are female.  What are the signs or symptoms? Symptoms of this condition include:  Fever.  Frequent urination or passing small amounts of urine frequently.  Needing to urinate urgently.  Pain or burning with urination.  Urine that smells bad or unusual.  Cloudy urine.  Pain in  the lower abdomen or back.  Trouble urinating.  Blood in the urine.  Vomiting or being less hungry than normal.  Diarrhea or abdominal pain.  Vaginal discharge, if you are female.  How is this diagnosed? This condition is diagnosed with a medical history and physical exam. You will also need to provide a urine sample to test your urine. Other tests may be done, including:  Blood tests.  Sexually transmitted disease (STD) testing.  If you have had more than one UTI, a cystoscopy or imaging studies may be done to determine the cause of the infections. How is this treated? Treatment for this condition often includes a combination of two or more of the following:  Antibiotic medicine.  Other medicines to treat less common causes of UTI.  Over-the-counter medicines to treat pain.  Drinking enough water to stay hydrated.  Follow these instructions at home:  Take over-the-counter and prescription medicines only as told by your health care provider.  If you were prescribed an antibiotic, take it as told by your health care provider. Do not stop taking the antibiotic even if you start to feel better.  Avoid alcohol, caffeine, tea, and carbonated beverages. They can irritate your bladder.  Drink enough fluid to keep your urine clear or pale yellow.  Keep all follow-up visits as told by your health care provider. This is important.  Make sure to: ? Empty your bladder often and completely. Do not hold urine for long periods of time. ? Empty your bladder before and after sex. ?  Wipe from front to back after a bowel movement if you are female. Use each tissue one time when you wipe. Contact a health care provider if:  You have back pain.  You have a fever.  You feel nauseous or vomit.  Your symptoms do not get better after 3 days.  Your symptoms go away and then return. Get help right away if:  You have severe back pain or lower abdominal pain.  You are vomiting and  cannot keep down any medicines or water. This information is not intended to replace advice given to you by your health care provider. Make sure you discuss any questions you have with your health care provider. Document Released: 12/22/2004 Document Revised: 08/26/2015 Document Reviewed: 02/02/2015 Elsevier Interactive Patient Education  [employer].

## 2019-01-02 LAB — URINE CULTURE
MICRO NUMBER:: 954361
Result:: NO GROWTH
SPECIMEN QUALITY:: ADEQUATE

## 2019-02-26 ENCOUNTER — Encounter: Payer: Self-pay | Admitting: Family

## 2019-02-26 ENCOUNTER — Other Ambulatory Visit: Payer: Self-pay

## 2019-02-26 ENCOUNTER — Ambulatory Visit (INDEPENDENT_AMBULATORY_CARE_PROVIDER_SITE_OTHER): Payer: Medicare HMO | Admitting: Family

## 2019-02-26 VITALS — BP 110/76 | HR 87 | Temp 98.4°F | Ht 72.0 in | Wt 191.0 lb

## 2019-02-26 DIAGNOSIS — R109 Unspecified abdominal pain: Secondary | ICD-10-CM

## 2019-02-26 NOTE — Progress Notes (Signed)
Gabriella Nguyen is a 63 y.o. female with the following history as recorded in Epic[facility]:  Patient Active Problem List   Diagnosis Date Noted  . Acute cystitis without hematuria 12/31/2018  . OA (osteoarthritis) of knee 07/10/2017  . 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 Skin lesion 10/11/2016  . Encounter for general adult medical examination with abnormal findings 08/30/2016  . Contusion, hip 06/12/2015  . Shoulder contusion 06/12/2015  . Cough 08/22/2014  . Mild concussion 04/21/2014  . Candida vaginitis 01/24/2014  . Aspiration pneumonia (HCC) 07/25/2011  . UTI (urinary tract infection) 07/25/2011  . Weakness generalized 07/25/2011  . Essential hypertension 04/28/2010  . BACTERIAL PNEUMONIA 04/28/2010  . Asthma 04/28/2010  . Osteoarthrosis, unspecified whether generalized or localized, involving lower leg 04/28/2010    Current Outpatient Medications  Medication Sig Dispense Refill  . acetaminophen (TYLENOL) 500 MG tablet Take 1,000 mg by mouth every 6 (six) hours as needed for moderate pain or headache.    . albuterol (PROVENTIL,VENTOLIN) 90 MCG/ACT inhaler Inhale 2 puffs into the lungs every 6 (six) hours as needed for wheezing or shortness of breath.     Marland Kitchen albuterol (VENTOLIN HFA) 108 (90 Base) MCG/ACT inhaler Inhale 2 puffs into the lungs every 6 (six) hours as needed for wheezing or shortness of breath. 6.7 g 2  . amoxicillin-clavulanate (AUGMENTIN) 875-125 MG tablet Take 1 tablet by mouth 2 (two) times daily. 14 tablet 0  . carvedilol (COREG) 12.5 MG tablet Take 0.5 tablets (6.25 mg total) by mouth 2 (two) times daily with a meal. 90 tablet 3  . losartan (COZAAR) 100 MG tablet TAKE 1 TABLET (100 MG TOTAL) BY MOUTH DAILY. 90 tablet 3  . mometasone-formoterol (DULERA) 200-5 MCG/ACT AERO Inhale 2 puffs into the lungs 2 (two) times daily. 8.8 g 2  . potassium chloride (K-DUR,KLOR-CON) 10 MEQ tablet Take 1 tablet (10 mEq total) by mouth daily. 90 tablet 3  .  spironolactone-hydrochlorothiazide (ALDACTAZIDE) 25-25 MG tablet Take 1 tablet by mouth daily. 90 tablet 3   No current facility-administered medications for this visit.     Allergies: Nifedipine  Past Medical History:  Diagnosis Date  . Aspiration pneumonia (HCC) 06/2011 hosp  . ASTHMA   . OSTEOARTHRITIS, KNEES, BILATERAL   . Unspecified essential hypertension     Past Surgical History:  Procedure Laterality Date  . CESAREAN SECTION  1989  . hand surgery-right Right 1990   at base of right thumb  . JOINT REPLACEMENT    . Right total knee  10/20/2015   done by Dr Norm Salt at the [facility] hospital   . TONSILLECTOMY     at age 61  . TOTAL KNEE ARTHROPLASTY Left 07/10/2017   Procedure: LEFT TOTAL KNEE ARTHROPLASTY;  Surgeon: Ollen Gross, MD;  Location: [facility];  Service: Orthopedics;  Laterality: Left;  . TOTAL KNEE ARTHROPLASTY WITH REVISION COMPONENTS Right 01/04/2017   Procedure: Right knee total knee arthroplasty revision;  Surgeon: Ollen Gross, MD;  Location: [facility];  Service: Orthopedics;  Laterality: Right;    Family History  Problem Relation Age of Onset  . Dementia Mother   . Hypertension Mother   . Heart disease Father   . COPD Father     Social History   Tobacco Use  . Smoking status: Former Smoker    Years: 2.00    Types: Cigarettes    Start date: 08/23/1984    Quit date: 1986    Years since quitting: 34.9  . Smokeless tobacco:  Never Used  Substance Use Topics  . Alcohol use: Yes    Alcohol/week: 2.0 standard drinks    Types: 2 Standard drinks or equivalent per week    Comment: beer or wine    Subjective:  Concerned about recurrent intermittent episodes of RUQ; is concerned that she could have gallstones; no nausea or vomiting with episodes- just "crampy pain." No changes in bowel movements; no fever; Had CT in 2013 that did show hiatal hernia but denies any reflux symptoms; has worked to lose 100+ pounds over the few years- eats small meals and eats  healthy meals;     Objective:  Vitals:   02/26/19 0956  BP: 110/76  Pulse: 87  Temp: 98.4 F (36.9 C)  TempSrc: Oral  SpO2: 96%  Weight: 191 lb (86.6 kg)  Height: 6' (1.829 m)    General: Well developed, well nourished, in no acute distress  Skin : Warm and dry.  Head: Normocephalic and atraumatic  Lungs: Respirations unlabored; clear to auscultation bilaterally without wheeze, rales, rhonchi  CVS exam: normal rate and regular rhythm.  Abdomen: Soft; nontender; nondistended; normoactive bowel sounds; no masses or hepatosplenomegaly  Neurologic: Alert and oriented; speech intact; face symmetrical; moves all extremities well; CNII-XII intact without focal deficit   Assessment:  1. Abdominal pain, unspecified abdominal location     Plan:  ? Gallbladder disease vs hiatal hernia; will updated abdominal ultrasound- patient to check on out of pocket costs- she may opt to do at the [place]; she will let us know; follow- up to be determined.   This visit occurred during the SARS-CoV-2 public health emergency.  Safety protocols were in place, including screening questions prior to the visit, additional usage of staff PPE, and extensive cleaning of exam room while observing appropriate contact time as indicated for disinfecting solutions.    No follow-ups on file.  No orders of the defined types were placed in this encounter.   Requested Prescriptions    No prescriptions requested or ordered in this encounter

## 2019-02-26 NOTE — Patient Instructions (Addendum)
Please call your insurance to check on the out of pocket costs for complete abdominal ultrasound; would like to do it at [facility] if possible;       Hiatal Hernia  A hiatal hernia occurs when part of the stomach slides above the muscle that separates the abdomen from the chest (diaphragm). A person can be born with a hiatal hernia (congenital), or it may develop over time. In almost all cases of hiatal hernia, only the top part of the stomach pushes through the diaphragm. Many people have a hiatal hernia with no symptoms. The larger the hernia, the more likely it is that you will have symptoms. In some cases, a hiatal hernia allows stomach acid to flow back into the tube that carries food from your mouth to your stomach (esophagus). This may cause heartburn symptoms. Severe heartburn symptoms may mean that you have developed a condition called gastroesophageal reflux disease (GERD). What are the causes? This condition is caused by a weakness in the opening (hiatus) where the esophagus passes through the diaphragm to attach to the upper part of the stomach. A person may be born with a weakness in the hiatus, or a weakness can develop over time. What increases the risk? This condition is more likely to develop in:  Older people. Age is a major risk factor for a hiatal hernia, especially if you are over the age of 8.  Pregnant women.  People who are overweight.  People who have frequent constipation. What are the signs or symptoms? Symptoms of this condition usually develop in the form of GERD symptoms. Symptoms include:  Heartburn.  Belching.  Indigestion.  Trouble swallowing.  Coughing or wheezing.  Sore throat.  Hoarseness.  Chest pain.  Nausea and vomiting. How is this diagnosed? This condition may be diagnosed during testing for GERD. Tests that may be done include:  X-rays of your stomach or chest.  An upper gastrointestinal (GI) series. This is an X-ray  exam of your GI tract that is taken after you swallow a chalky liquid that shows up clearly on the X-ray.  Endoscopy. This is a procedure to look into your stomach using a thin, flexible tube that has a tiny camera and light on the end of it. How is this treated? This condition may be treated by:  Dietary and lifestyle changes to help reduce GERD symptoms.  Medicines. These may include: ? Over-the-counter antacids. ? Medicines that make your stomach empty more quickly. ? Medicines that block the production of stomach acid (H2 blockers). ? Stronger medicines to reduce stomach acid (proton pump inhibitors).  Surgery to repair the hernia, if other treatments are not helping. If you have no symptoms, you may not need treatment. Follow these instructions at home: Lifestyle and activity  Do not use any products that contain nicotine or tobacco, such as cigarettes and e-cigarettes. If you need help quitting, ask your health care provider.  Try to achieve and maintain a healthy body weight.  Avoid putting pressure on your abdomen. Anything that puts pressure on your abdomen increases the amount of acid that may be pushed up into your esophagus. ? Avoid bending over, especially after eating. ? Raise the head of your bed by putting blocks under the legs. This keeps your head and esophagus higher than your stomach. ? Do not wear tight clothing around your chest or stomach. ? Try not to strain when having a bowel movement, when urinating, or when lifting heavy objects. Eating and drinking  Avoid foods that can worsen GERD symptoms. These may include: ? Fatty foods, like fried foods. ? Citrus fruits, like oranges or lemon. ? Other foods and drinks that contain acid, like orange juice or tomatoes. ? Spicy food. ? Chocolate.  Eat frequent small meals instead of three large meals a day. This helps prevent your stomach from getting too full. ? Eat slowly. ? Do not lie down right after  eating. ? Do not eat 1-2 hours before bed.  Do not drink beverages with caffeine. These include cola, coffee, cocoa, and tea.  Do not drink alcohol. General instructions  Take over-the-counter and prescription medicines only as told by your health care provider.  Keep all follow-up visits as told by your health care provider. This is important. Contact a health care provider if:  Your symptoms are not controlled with medicines or lifestyle changes.  You are having trouble swallowing.  You have coughing or wheezing that will not go away. Get help right away if:  Your pain is getting worse.  Your pain spreads to your arms, neck, jaw, teeth, or back.  You have shortness of breath.  You sweat for no reason.  You feel sick to your stomach (nauseous) or you vomit.  You vomit blood.  You have bright red blood in your stools.  You have black, tarry stools. This information is not intended to replace advice given to you by your health care provider. Make sure you discuss any questions you have with your health care provider. Document Released: 06/04/2003 Document Revised: 02/24/2017 Document Reviewed: 10/17/2016 Elsevier Patient Education  2020 [employer].

## 2019-04-10 ENCOUNTER — Telehealth: Payer: Self-pay | Admitting: Family

## 2019-04-10 NOTE — Telephone Encounter (Signed)
Pt has some questions regarding the covid vaccine and what category she would be in since she has been hospitalized and has asthma. Please call pt at [phone]  Please advise.

## 2019-04-10 NOTE — Telephone Encounter (Signed)
Spoke with patient and info given. She knows that we are not sure when next phase will be rolling out and also that we do no currently have the vaccine and may or may not be getting it. She knows to stay tuned for future updates as they continue to come in.

## 2019-06-05 ENCOUNTER — Other Ambulatory Visit: Payer: Self-pay | Admitting: Family

## 2019-06-06 ENCOUNTER — Other Ambulatory Visit: Payer: Self-pay | Admitting: Family

## 2019-06-21 ENCOUNTER — Ambulatory Visit (INDEPENDENT_AMBULATORY_CARE_PROVIDER_SITE_OTHER): Payer: Medicare HMO | Admitting: Family

## 2019-06-21 ENCOUNTER — Other Ambulatory Visit: Payer: Self-pay

## 2019-06-21 ENCOUNTER — Encounter: Payer: Self-pay | Admitting: Family

## 2019-06-21 VITALS — BP 112/70 | HR 87 | Temp 98.6°F | Ht 72.0 in | Wt 173.4 lb

## 2019-06-21 DIAGNOSIS — L309 Dermatitis, unspecified: Secondary | ICD-10-CM

## 2019-06-21 DIAGNOSIS — R300 Dysuria: Secondary | ICD-10-CM | POA: Diagnosis not present

## 2019-06-21 DIAGNOSIS — R634 Abnormal weight loss: Secondary | ICD-10-CM

## 2019-06-21 LAB — POC URINALSYSI DIPSTICK (AUTOMATED)
Bilirubin, UA: NEGATIVE
Blood, UA: POSITIVE
Glucose, UA: NEGATIVE
Ketones, UA: NEGATIVE
Nitrite, UA: NEGATIVE
Protein, UA: POSITIVE — AB
Spec Grav, UA: 1.015 (ref 1.010–1.025)
Urobilinogen, UA: 0.2 E.U./dL
pH, UA: 6 (ref 5.0–8.0)

## 2019-06-21 MED ORDER — NITROFURANTOIN MONOHYD MACRO 100 MG PO CAPS: 100.0000 mg | ORAL_CAPSULE | Freq: Two times a day (BID) | ORAL | 0 refills | Status: DC

## 2019-06-21 MED ORDER — TRIAMCINOLONE ACETONIDE 0.1 % EX CREA: 1.0000 "application " | TOPICAL_CREAM | Freq: Two times a day (BID) | CUTANEOUS | 0 refills | Status: DC

## 2019-06-21 NOTE — Progress Notes (Signed)
Gabriella Nguyen is a 64 y.o. female with the following history as recorded in EpicCare:  Patient Active Problem List   Diagnosis Date Noted  . Acute cystitis without hematuria 12/31/2018  . OA (osteoarthritis) of knee 07/10/2017  . 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 Skin lesion 10/11/2016  . Encounter for general adult medical examination with abnormal findings 08/30/2016  . Contusion, hip 06/12/2015  . Shoulder contusion 06/12/2015  . Cough 08/22/2014  . Mild concussion 04/21/2014  . Candida vaginitis 01/24/2014  . Aspiration pneumonia (HCC) 07/25/2011  . UTI (urinary tract infection) 07/25/2011  . Weakness generalized 07/25/2011  . Essential hypertension 04/28/2010  . BACTERIAL PNEUMONIA 04/28/2010  . Asthma 04/28/2010  . Osteoarthrosis, unspecified whether generalized or localized, involving lower leg 04/28/2010    Current Outpatient Medications  Medication Sig Dispense Refill  . acetaminophen (TYLENOL) 500 MG tablet Take 1,000 mg by mouth every 6 (six) hours as needed for moderate pain or headache.    . albuterol (PROVENTIL,VENTOLIN) 90 MCG/ACT inhaler Inhale 2 puffs into the lungs every 6 (six) hours as needed for wheezing or shortness of breath.     Marland Kitchen albuterol (VENTOLIN HFA) 108 (90 Base) MCG/ACT inhaler Inhale 2 puffs into the lungs every 6 (six) hours as needed for wheezing or shortness of breath. 6.7 g 2  . amoxicillin-clavulanate (AUGMENTIN) 875-125 MG tablet Take 1 tablet by mouth 2 (two) times daily. 14 tablet 0  . carvedilol (COREG) 12.5 MG tablet Take 0.5 tablets (6.25 mg total) by mouth 2 (two) times daily with a meal. 90 tablet 3  . losartan (COZAAR) 100 MG tablet TAKE 1 TABLET EVERY DAY 90 tablet 1  . mometasone-formoterol (DULERA) 200-5 MCG/ACT AERO Inhale 2 puffs into the lungs 2 (two) times daily. 8.8 g 2  . potassium chloride (K-DUR,KLOR-CON) 10 MEQ tablet Take 1 tablet (10 mEq total) by mouth daily. 90 tablet 3  .  spironolactone-hydrochlorothiazide (ALDACTAZIDE) 25-25 MG tablet TAKE 1 TABLET EVERY DAY 90 tablet 1  . nitrofurantoin, macrocrystal-monohydrate, (MACROBID) 100 MG capsule Take 1 capsule (100 mg total) by mouth 2 (two) times daily. 14 capsule 0  . triamcinolone cream (KENALOG) 0.1 % Apply 1 application topically 2 (two) times daily. 30 g 0   No current facility-administered medications for this visit.    Allergies: Nifedipine  Past Medical History:  Diagnosis Date  . Aspiration pneumonia (HCC) 06/2011 hosp  . ASTHMA   . OSTEOARTHRITIS, KNEES, BILATERAL   . Unspecified essential hypertension     Past Surgical History:  Procedure Laterality Date  . CESAREAN SECTION  1989  . hand surgery-right Right 1990   at base of right thumb  . JOINT REPLACEMENT    . Right total knee  10/20/2015   done by Dr Norm Salt at the [facility] hospital   . TONSILLECTOMY     at age 67  . TOTAL KNEE ARTHROPLASTY Left 07/10/2017   Procedure: LEFT TOTAL KNEE ARTHROPLASTY;  Surgeon: Ollen Gross, MD;  Location: [facility];  Service: Orthopedics;  Laterality: Left;  . TOTAL KNEE ARTHROPLASTY WITH REVISION COMPONENTS Right 01/04/2017   Procedure: Right knee total knee arthroplasty revision;  Surgeon: Ollen Gross, MD;  Location: [facility];  Service: Orthopedics;  Laterality: Right;    Family History  Problem Relation Age of Onset  . Dementia Mother   . Hypertension Mother   . Heart disease Father   . COPD Father     Social History   Tobacco Use  . Smoking status:  Former Smoker    Years: 2.00    Types: Cigarettes    Start date: 08/23/1984    Quit date: 1986    Years since quitting: 35.2  . Smokeless tobacco: Never Used  Substance Use Topics  . Alcohol use: Yes    Alcohol/week: 2.0 standard drinks    Types: 2 Standard drinks or equivalent per week    Comment: beer or wine    Subjective:  Complaining of persisting UTI symptoms; was treated in October and is concerned that did not clear completely;   Has  lost almost 20 pounds since December 2020; she feels it is due to changes in her diet but is planning to schedule a follow-up with the GI at the [facility] in [place];     Objective:  Vitals:   06/21/19 1119  BP: 112/70  Pulse: 87  Temp: 98.6 F (37 C)  TempSrc: Oral  SpO2: 98%  Weight: 173 lb 6.4 oz (78.7 kg)  Height: 6' (1.829 m)    General: Well developed, well nourished, in no acute distress  Skin : Warm and dry. Dry skin noted on medial right elbow Head: Normocephalic and atraumatic  Lungs: Respirations unlabored; clear to auscultation bilaterally without wheeze, rales, rhonchi  CVS exam: normal rate and regular rhythm.  Musculoskeletal: No deformities; no active joint inflammation  Extremities: No edema, cyanosis, clubbing  Vessels: Symmetric bilaterally  Neurologic: Alert and oriented; speech intact; face symmetrical; moves all extremities well; CNII-XII intact without focal deficit   Assessment:  1. Dysuria   2. Weight loss   3. Eczema, unspecified type     Plan:  1. Check urine culture; Rx for Macrobid 100 mg bid x 7 days; follow-up to be determined; 2. Patient prefers to have her work-up/ labs done at [place] due to cost concerns; if planning to schedule follow-up with her GI at the [facility] next week; 3. Rx for Triamcinolone bid to affected area;  This visit occurred during the SARS-CoV-2 public health emergency.  Safety protocols were in place, including screening questions prior to the visit, additional usage of staff PPE, and extensive cleaning of exam room while observing appropriate contact time as indicated for disinfecting solutions.     No follow-ups on file.  Orders Placed This Encounter  Procedures  . Urine Culture    Standing Status:   Future    Number of Occurrences:   1    Standing Expiration Date:   06/20/2020    Requested Prescriptions   Signed Prescriptions Disp Refills  . nitrofurantoin, macrocrystal-monohydrate, (MACROBID) 100 MG capsule 14 capsule 0     Sig: Take 1 capsule (100 mg total) by mouth 2 (two) times daily.  Marland Kitchen triamcinolone cream (KENALOG) 0.1 % 30 g 0    Sig: Apply 1 application topically 2 (two) times daily.

## 2019-06-21 NOTE — Addendum Note (Signed)
Addended by: Karma Ganja on: 06/21/2019 02:09 PM   Modules accepted: Orders

## 2019-06-23 LAB — URINE CULTURE

## 2019-06-26 ENCOUNTER — Other Ambulatory Visit: Payer: Self-pay | Admitting: Family

## 2019-07-05 ENCOUNTER — Telehealth: Payer: Self-pay | Admitting: Family

## 2019-07-05 MED ORDER — SULFAMETHOXAZOLE-TRIMETHOPRIM 800-160 MG PO TABS: 1.0000 | ORAL_TABLET | Freq: Two times a day (BID) | ORAL | 0 refills | Status: DC

## 2019-07-05 NOTE — Telephone Encounter (Signed)
New message:   Pt is calling and states she was given medication for a UTI but is feeling like the medication is not working . Please advise.

## 2019-07-05 NOTE — Telephone Encounter (Signed)
Patient made aware of and has already been contacted about script.

## 2019-07-05 NOTE — Telephone Encounter (Signed)
We can try a second antibiotic. The initial antibiotic should have treated the infection so if the symptoms persist, we may need to have her see a urologist.

## 2019-07-30 ENCOUNTER — Telehealth: Payer: Self-pay | Admitting: Family

## 2019-07-30 NOTE — Telephone Encounter (Signed)
Dr. Willa Rough called this morning and was wondering if the patient could discontinue Aldactone. Says the patients BP was lower. Her BP today was 106/62,with a pulse of 99.

## 2019-07-30 NOTE — Telephone Encounter (Signed)
I need more information to better understand this message. Who is Dr. Willa Rough? Is this a [facility] provider? What is the patient seeing Dr. Willa Rough about?

## 2019-07-31 NOTE — Telephone Encounter (Signed)
Spoke with Sharlet Salina today from the [place] with Dr.Hicks office. He has relayed message to Dr. Willa Rough that patient will only need to see [place] for urgent care needs and follow up with him regarding blood pressure and any other medical needs. I called patient and left message for her to return call to clinic so I could go over info with her as well. Per Sharlet Salina Dr. Willa Rough had already d/c medication.

## 2019-07-31 NOTE — Telephone Encounter (Signed)
I would prefer that she just have her blood pressure managed by [facility] provider. This is dangerous to have multiple PCPs. We are happy to see her for urgent care needs. She needs to call Dr. Willa Rough back to get guidance about her blood pressure.

## 2019-07-31 NOTE — Telephone Encounter (Signed)
Spoke with patient and info given. She will follow up with Dr. Willa Rough regarding her BP.

## 2019-11-03 ENCOUNTER — Other Ambulatory Visit: Payer: Self-pay | Admitting: Family

## 2019-12-19 DIAGNOSIS — H5213 Myopia, bilateral: Secondary | ICD-10-CM | POA: Diagnosis not present

## 2021-01-04 DIAGNOSIS — H5213 Myopia, bilateral: Secondary | ICD-10-CM | POA: Diagnosis not present

## 2021-03-28 ENCOUNTER — Emergency Department (HOSPITAL_BASED_OUTPATIENT_CLINIC_OR_DEPARTMENT_OTHER): Payer: No Typology Code available for payment source | Admitting: Radiology

## 2021-03-28 ENCOUNTER — Encounter (HOSPITAL_COMMUNITY): Payer: Self-pay | Admitting: Family Medicine

## 2021-03-28 ENCOUNTER — Other Ambulatory Visit: Payer: Self-pay

## 2021-03-28 ENCOUNTER — Inpatient Hospital Stay (HOSPITAL_BASED_OUTPATIENT_CLINIC_OR_DEPARTMENT_OTHER)
Admission: EM | Admit: 2021-03-28 | Discharge: 2021-03-31 | DRG: 308 | Disposition: A | Discharge status: Home / Self Care | Payer: No Typology Code available for payment source | Attending: Internal Medicine | Admitting: Internal Medicine

## 2021-03-28 DIAGNOSIS — I4891 Unspecified atrial fibrillation: Secondary | ICD-10-CM | POA: Diagnosis not present

## 2021-03-28 DIAGNOSIS — Z96653 Presence of artificial knee joint, bilateral: Secondary | ICD-10-CM | POA: Diagnosis present

## 2021-03-28 DIAGNOSIS — Z7951 Long term (current) use of inhaled steroids: Secondary | ICD-10-CM | POA: Diagnosis not present

## 2021-03-28 DIAGNOSIS — I959 Hypotension, unspecified: Secondary | ICD-10-CM | POA: Diagnosis present

## 2021-03-28 DIAGNOSIS — Z6821 Body mass index (BMI) 21.0-21.9, adult: Secondary | ICD-10-CM

## 2021-03-28 DIAGNOSIS — E440 Moderate protein-calorie malnutrition: Secondary | ICD-10-CM | POA: Diagnosis present

## 2021-03-28 DIAGNOSIS — Z888 Allergy status to other drugs, medicaments and biological substances status: Secondary | ICD-10-CM

## 2021-03-28 DIAGNOSIS — Z87891 Personal history of nicotine dependence: Secondary | ICD-10-CM

## 2021-03-28 DIAGNOSIS — Z825 Family history of asthma and other chronic lower respiratory diseases: Secondary | ICD-10-CM

## 2021-03-28 DIAGNOSIS — Z8249 Family history of ischemic heart disease and other diseases of the circulatory system: Secondary | ICD-10-CM

## 2021-03-28 DIAGNOSIS — J4530 Mild persistent asthma, uncomplicated: Secondary | ICD-10-CM | POA: Diagnosis not present

## 2021-03-28 DIAGNOSIS — E871 Hypo-osmolality and hyponatremia: Secondary | ICD-10-CM | POA: Diagnosis present

## 2021-03-28 DIAGNOSIS — J45909 Unspecified asthma, uncomplicated: Secondary | ICD-10-CM | POA: Diagnosis present

## 2021-03-28 DIAGNOSIS — I10 Essential (primary) hypertension: Secondary | ICD-10-CM | POA: Diagnosis not present

## 2021-03-28 DIAGNOSIS — F419 Anxiety disorder, unspecified: Secondary | ICD-10-CM | POA: Diagnosis present

## 2021-03-28 DIAGNOSIS — E876 Hypokalemia: Secondary | ICD-10-CM | POA: Diagnosis present

## 2021-03-28 DIAGNOSIS — U071 COVID-19: Secondary | ICD-10-CM | POA: Diagnosis not present

## 2021-03-28 DIAGNOSIS — Z79899 Other long term (current) drug therapy: Secondary | ICD-10-CM

## 2021-03-28 DIAGNOSIS — I4819 Other persistent atrial fibrillation: Secondary | ICD-10-CM | POA: Diagnosis not present

## 2021-03-28 DIAGNOSIS — Z719 Counseling, unspecified: Secondary | ICD-10-CM | POA: Diagnosis not present

## 2021-03-28 LAB — COMPREHENSIVE METABOLIC PANEL
ALT: 25 U/L (ref 0–44)
AST: 42 U/L — ABNORMAL HIGH (ref 15–41)
Albumin: 3.6 g/dL (ref 3.5–5.0)
Alkaline Phosphatase: 54 U/L (ref 38–126)
Anion gap: 9 (ref 5–15)
BUN: 15 mg/dL (ref 8–23)
CO2: 26 mmol/L (ref 22–32)
Calcium: 8.8 mg/dL — ABNORMAL LOW (ref 8.9–10.3)
Chloride: 98 mmol/L (ref 98–111)
Creatinine, Ser: 0.66 mg/dL (ref 0.44–1.00)
GFR, Estimated: >60 mL/min (ref >60)
Glucose, Bld: 96 mg/dL (ref 70–99)
Potassium: 3.9 mmol/L (ref 3.5–5.1)
Sodium: 133 mmol/L — ABNORMAL LOW (ref 135–145)
Total Bilirubin: 0.8 mg/dL (ref 0.3–1.2)
Total Protein: 6.6 g/dL (ref 6.5–8.1)

## 2021-03-28 LAB — TROPONIN I (HIGH SENSITIVITY)
Troponin I (High Sensitivity): 13 ng/L (ref <18)
Troponin I (High Sensitivity): 17 ng/L (ref <18)

## 2021-03-28 LAB — URINALYSIS, ROUTINE W REFLEX MICROSCOPIC
Bilirubin Urine: NEGATIVE
Glucose, UA: NEGATIVE mg/dL
Hgb urine dipstick: NEGATIVE
Ketones, ur: NEGATIVE mg/dL
Leukocytes,Ua: NEGATIVE
Nitrite: NEGATIVE
Protein, ur: NEGATIVE mg/dL
Specific Gravity, Urine: 1.012 (ref 1.005–1.030)
pH: 6.5 (ref 5.0–8.0)

## 2021-03-28 LAB — CBC WITH DIFFERENTIAL/PLATELET
Abs Immature Granulocytes: 0.02 10*3/uL (ref 0.00–0.07)
Basophils Absolute: 0.1 10*3/uL (ref 0.0–0.1)
Basophils Relative: 1 %
Eosinophils Absolute: 0.2 10*3/uL (ref 0.0–0.5)
Eosinophils Relative: 3 %
HCT: 39.7 % (ref 36.0–46.0)
Hemoglobin: 13.7 g/dL (ref 12.0–15.0)
Immature Granulocytes: 0 %
Lymphocytes Relative: 19 %
Lymphs Abs: 1.3 10*3/uL (ref 0.7–4.0)
MCH: 33.4 pg (ref 26.0–34.0)
MCHC: 34.5 g/dL (ref 30.0–36.0)
MCV: 96.8 fL (ref 80.0–100.0)
Monocytes Absolute: 0.7 10*3/uL (ref 0.1–1.0)
Monocytes Relative: 10 %
Neutro Abs: 4.8 10*3/uL (ref 1.7–7.7)
Neutrophils Relative %: 67 %
Platelets: 218 10*3/uL (ref 150–400)
RBC: 4.1 MIL/uL (ref 3.87–5.11)
RDW: 12.3 % (ref 11.5–15.5)
WBC: 7.1 10*3/uL (ref 4.0–10.5)
nRBC: 0 % (ref 0.0–0.2)

## 2021-03-28 LAB — PROTIME-INR
INR: 1 (ref 0.8–1.2)
Prothrombin Time: 13.4 seconds (ref 11.4–15.2)

## 2021-03-28 LAB — BRAIN NATRIURETIC PEPTIDE: B Natriuretic Peptide: 20.1 pg/mL (ref 0.0–100.0)

## 2021-03-28 LAB — LIPASE, BLOOD: Lipase: 25 U/L (ref 11–51)

## 2021-03-28 LAB — RESP PANEL BY RT-PCR (FLU A&B, COVID) ARPGX2
Influenza A by PCR: NEGATIVE
Influenza B by PCR: NEGATIVE
SARS Coronavirus 2 by RT PCR: POSITIVE — AB

## 2021-03-28 MED ORDER — METOPROLOL TARTRATE 25 MG PO TABS
25.0000 mg | ORAL_TABLET | Freq: Two times a day (BID) | ORAL | Status: DC
  Administered 2021-03-28 – 2021-03-31 (×6): 25 mg via ORAL
  Filled 2021-03-28 (×6): qty 1

## 2021-03-28 MED ORDER — HEPARIN BOLUS VIA INFUSION
4000.0000 [IU] | Freq: Once | INTRAVENOUS | Status: AC
  Administered 2021-03-28: 4000 [IU] via INTRAVENOUS

## 2021-03-28 MED ORDER — DILTIAZEM HCL-DEXTROSE 125-5 MG/125ML-% IV SOLN (PREMIX)
5.0000 mg/h | INTRAVENOUS | Status: DC
  Administered 2021-03-28: 5 mg/h via INTRAVENOUS
  Filled 2021-03-28: qty 125

## 2021-03-28 MED ORDER — APIXABAN 5 MG PO TABS
5.0000 mg | ORAL_TABLET | Freq: Two times a day (BID) | ORAL | Status: DC
  Administered 2021-03-28 – 2021-03-31 (×6): 5 mg via ORAL
  Filled 2021-03-28 (×6): qty 1

## 2021-03-28 MED ORDER — ONDANSETRON HCL 4 MG/2ML IJ SOLN: 4.0000 mg | Freq: Four times a day (QID) | INTRAMUSCULAR | Status: DC | PRN

## 2021-03-28 MED ORDER — ACETAMINOPHEN 325 MG PO TABS: 650.0000 mg | ORAL_TABLET | ORAL | Status: DC | PRN

## 2021-03-28 MED ORDER — HEPARIN (PORCINE) 25000 UT/250ML-% IV SOLN
14.0000 [IU]/kg/h | INTRAVENOUS | Status: DC
Start: 2021-03-28 — End: 2021-03-28

## 2021-03-28 MED ORDER — SODIUM CHLORIDE 0.9 % IV BOLUS
500.0000 mL | Freq: Once | INTRAVENOUS | Status: AC
Start: 2021-03-28 — End: 2021-03-28
  Administered 2021-03-28: 500 mL via INTRAVENOUS

## 2021-03-28 MED ORDER — HEPARIN SODIUM (PORCINE) 5000 UNIT/ML IJ SOLN: 4000.0000 [IU] | Freq: Once | INTRAMUSCULAR | Status: DC

## 2021-03-28 MED ORDER — DILTIAZEM LOAD VIA INFUSION
15.0000 mg | Freq: Once | INTRAVENOUS | Status: AC
  Administered 2021-03-28: 15 mg via INTRAVENOUS
  Filled 2021-03-28: qty 15

## 2021-03-28 MED ORDER — MOMETASONE FURO-FORMOTEROL FUM 100-5 MCG/ACT IN AERO
2.0000 | INHALATION_SPRAY | Freq: Two times a day (BID) | RESPIRATORY_TRACT | Status: DC
  Administered 2021-03-29 – 2021-03-31 (×5): 2 via RESPIRATORY_TRACT
  Filled 2021-03-28: qty 8.8

## 2021-03-28 MED ORDER — ALBUTEROL SULFATE HFA 108 (90 BASE) MCG/ACT IN AERS
2.0000 | INHALATION_SPRAY | Freq: Four times a day (QID) | RESPIRATORY_TRACT | Status: DC | PRN
  Filled 2021-03-28: qty 6.7

## 2021-03-28 MED ORDER — HEPARIN (PORCINE) 25000 UT/250ML-% IV SOLN
1100.0000 [IU]/h | INTRAVENOUS | Status: DC
  Administered 2021-03-28: 1100 [IU]/h via INTRAVENOUS
  Filled 2021-03-28: qty 250

## 2021-03-28 NOTE — ED Triage Notes (Signed)
Pt states that after returning home from church today she felt her heart racing, more in her abdomen than chest. Pt states she felt jittery at that time. Denies CP, SOB.

## 2021-03-28 NOTE — Progress Notes (Signed)
ANTICOAGULATION CONSULT NOTE - Initial Consult  Pharmacy Consult for Heparin transition to apixaban Indication: atrial fibrillation  Allergies  Allergen Reactions   Nifedipine Swelling and Other (See Comments)    Swelling gums     Patient Measurements: Height: 6' (182.9 cm) Weight: 70.3 kg (155 lb) IBW/kg (Calculated) : 73.1 Heparin Dosing Weight: 70.3kg  Vital Signs: Temp: 98.3 F (36.8 C) (01/01 1836) Temp Source: Oral (01/01 1836) BP: 110/80 (01/01 1836) Pulse Rate: 108 (01/01 1836)  Labs: Recent Labs    03/28/21 1357 03/28/21 1520  HGB 13.7  --   HCT 39.7  --   PLT 218  --   LABPROT 13.4  --   INR 1.0  --   CREATININE 0.66  --   TROPONINIHS 13 17     Estimated Creatinine Clearance: 76.8 mL/min (by C-G formula based on SCr of 0.66 mg/dL).   Medical History: Past Medical History:  Diagnosis Date   Aspiration pneumonia (HCC) 06/2011 hosp   ASTHMA    OSTEOARTHRITIS, KNEES, BILATERAL    Unspecified essential hypertension     Medications:  Medications Prior to Admission  Medication Sig Dispense Refill Last Dose   acetaminophen (TYLENOL) 500 MG tablet Take 1,000 mg by mouth every 6 (six) hours as needed for moderate pain or headache.      albuterol (PROVENTIL,VENTOLIN) 90 MCG/ACT inhaler Inhale 2 puffs into the lungs every 6 (six) hours as needed for wheezing or shortness of breath.       albuterol (VENTOLIN HFA) 108 (90 Base) MCG/ACT inhaler Inhale 2 puffs into the lungs every 6 (six) hours as needed for wheezing or shortness of breath. 6.7 g 2    carvedilol (COREG) 12.5 MG tablet TAKE 1/2 TABLET TWICE DAILY WITH MEALS 90 tablet 3    losartan (COZAAR) 100 MG tablet TAKE 1 TABLET EVERY DAY 90 tablet 1    mometasone-formoterol (DULERA) 200-5 MCG/ACT AERO Inhale 2 puffs into the lungs 2 (two) times daily. 8.8 g 2    potassium chloride (K-DUR,KLOR-CON) 10 MEQ tablet Take 1 tablet (10 mEq total) by mouth daily. 90 tablet 3    spironolactone-hydrochlorothiazide  (ALDACTAZIDE) 25-25 MG tablet TAKE 1 TABLET EVERY DAY 90 tablet 1    sulfamethoxazole-trimethoprim (BACTRIM DS) 800-160 MG tablet Take 1 tablet by mouth 2 (two) times daily. 10 tablet 0    triamcinolone cream (KENALOG) 0.1 % Apply 1 application topically 2 (two) times daily. 30 g 0     Scheduled:   apixaban  5 mg Oral BID   metoprolol tartrate  25 mg Oral BID   Infusions:   diltiazem (CARDIZEM) infusion 10 mg/hr (03/28/21 1732)   PRN:   Assessment: 65 yof with pAF and incidental COVID. Patient is presenting with palpitations.  Patient is not on anticoagulation prior to arrival. Heparin has already been discontinued by MD.  Hgb 13.7; plt 218  Goal of Therapy:  Heparin level 0.3-0.7 units/ml Monitor platelets by anticoagulation protocol: Yes   Plan:  Start Eliquis 5 mg BID with first dose now  Thank you for allowing pharmacy to participate in this patient's care.  Enos Fling, PharmD PGY1 Pharmacy Resident 03/28/2021 7:48 PM Check [email or web address] for unit specific pharmacy number

## 2021-03-28 NOTE — ED Provider Notes (Signed)
Patient turned over to me.  Patient with new onset atrial fibrillation also incidentally positive for COVID but could be the irritant causing the atrial fibs.  Patient received diltiazem dill drip that is being titrated.  Heart rates got down to around 100s now they are back up to around 122 patient in no extremitas.  No respiratory difficulties.  But patient will require admission for rate control.  Patient not on any blood thinners.  Will start heparin discussed with hospitalist they will admit.  CRITICAL CARE Performed by: Vanetta Mulders Total critical care time: 35 minutes Critical care time was exclusive of separately billable procedures and treating other patients. Critical care was necessary to treat or prevent imminent or life-threatening deterioration. Critical care was time spent personally by me on the following activities: development of treatment plan with patient and/or surrogate as well as nursing, discussions with consultants, evaluation of patient's response to treatment, examination of patient, obtaining history from patient or surrogate, ordering and performing treatments and interventions, ordering and review of laboratory studies, ordering and review of radiographic studies, pulse oximetry and re-evaluation of patient's condition.    Vanetta Mulders, MD 03/28/21 [id number]

## 2021-03-28 NOTE — ED Notes (Signed)
Called [facility] @ [phone]

## 2021-03-28 NOTE — Progress Notes (Signed)
Pt admitted to 6E AxOx4, VS wnL and as per flow. Pt oriented to 6E processes. Pt familiar with the [facility] system. Direct admit from [facility]. Pt on heparin and Diltiazem gtt. Pt hungry, hasn't eaten all day. MD paged. All questions and concerns addressed. Call bell placed within reach, will continue to monitor and maintain safety.

## 2021-03-28 NOTE — H&P (Signed)
History and Physical    Gabriella Nguyen [id number] DOB: Nov 10, 1955 DOA: 03/28/2021  PCP: Marrian Salvage, FNP   Patient coming from: Home   Chief Complaint: Palpitations, lightheaded   HPI: Gabriella Nguyen is a very pleasant 66 y.o. female with medical history significant for asthma and hypertension, now presenting to the emergency department for evaluation of rapid palpitations and lightheadedness.  Patient has been a little bit more fatigued than usual and slightly lightheaded at times over the past few days but had been able to go about her usual activities and attended to church services today where she felt well, but after returning home she had fairly acute lightheadedness and palpitations.  She has not had any chest pain, leg swelling, fevers, chills, or shortness of breath.  No syncope.  She was treated with Augmentin for respiratory infection last month which she feels has completely resolved.  She had a CT of her chest, abdomen, and pelvis last month prior to the antibiotic that was notable for moderate coronary calcifications and nodular tree-in-bud opacities in the right upper lobe with debris in the right mainstem bronchus suggestive of aspiration or infection.  She denies any significant bleeding or easy bruising.  No falls.    [facility] ED Course: Upon arrival to the ED, patient is found to be afebrile, saturating well on room air, normal blood pressure and respiratory rate, and heart rate in the 140s to 160s.  EKG features atrial fibrillation with rate 143.  Chest x-ray negative for acute cardiopulmonary disease.  Chemistry panel notable for mild hyponatremia.  CBC unremarkable.  Serial troponin measurements and BNP were normal.  COVID PCR was positive.  Patient was given 15 mg IV diltiazem and started on diltiazem infusion.  She was also given 500 cc of saline, started on IV heparin, and oral metoprolol was ordered.  She was transferred to [facility] for  admission.  Review of Systems:  All other systems reviewed and apart from HPI, are negative.  Past Medical History:  Diagnosis Date   Aspiration pneumonia ([place]) 06/2011 hosp   ASTHMA    OSTEOARTHRITIS, KNEES, BILATERAL    Unspecified essential hypertension     Past Surgical History:  Procedure Laterality Date   CESAREAN SECTION  1989   hand surgery-right Right 1990   at base of right thumb   JOINT REPLACEMENT     Right total knee  10/20/2015   done by Dr Eilene Ghazi at the [facility] hospital    TONSILLECTOMY     at age 21   [place] Left 07/10/2017   Procedure: LEFT TOTAL KNEE ARTHROPLASTY;  Surgeon: Gaynelle Arabian, MD;  Location: [facility] ORS;  Service: Orthopedics;  Laterality: Left;   TOTAL KNEE ARTHROPLASTY WITH REVISION COMPONENTS Right 01/04/2017   Procedure: Right knee total knee arthroplasty revision;  Surgeon: Gaynelle Arabian, MD;  Location: [facility] ORS;  Service: Orthopedics;  Laterality: Right;    Social History:   reports that she quit smoking about 36 years ago. Her smoking use included cigarettes. She started smoking about 36 years ago. She has never used smokeless tobacco. She reports current alcohol use of about 2.0 standard drinks per week. She reports that she does not use drugs.  Allergies  Allergen Reactions   Nifedipine Swelling and Other (See Comments)    Swelling gums     Family History  Problem Relation Age of Onset   Dementia Mother    Hypertension Mother    Heart disease Father  COPD Father      Prior to Admission medications   Medication Sig Start Date End Date Taking? Authorizing Provider  acetaminophen (TYLENOL) 500 MG tablet Take 1,000 mg by mouth every 6 (six) hours as needed for moderate pain or headache.    [provider]  albuterol (PROVENTIL,VENTOLIN) 90 MCG/ACT inhaler Inhale 2 puffs into the lungs every 6 (six) hours as needed for wheezing or shortness of breath.  09/02/10   Norins, Heinz Knuckles, MD  albuterol (VENTOLIN HFA)  108 (90 Base) MCG/ACT inhaler Inhale 2 puffs into the lungs every 6 (six) hours as needed for wheezing or shortness of breath. 11/20/18   Marrian Salvage, FNP  carvedilol (COREG) 12.5 MG tablet TAKE 1/2 TABLET TWICE DAILY WITH MEALS 06/26/19   Marrian Salvage, FNP  losartan (COZAAR) 100 MG tablet TAKE 1 TABLET EVERY DAY 06/06/19   Marrian Salvage, FNP  mometasone-formoterol [facility]) 200-5 MCG/ACT AERO Inhale 2 puffs into the lungs 2 (two) times daily. 11/20/18   Marrian Salvage, FNP  potassium chloride (K-DUR,KLOR-CON) 10 MEQ tablet Take 1 tablet (10 mEq total) by mouth daily. 06/05/18   Marrian Salvage, FNP  spironolactone-hydrochlorothiazide (ALDACTAZIDE) 25-25 MG tablet TAKE 1 TABLET EVERY DAY 06/06/19   Marrian Salvage, FNP  sulfamethoxazole-trimethoprim (BACTRIM DS) 800-160 MG tablet Take 1 tablet by mouth 2 (two) times daily. 07/05/19   Marrian Salvage, FNP  triamcinolone cream (KENALOG) 0.1 % Apply 1 application topically 2 (two) times daily. 06/21/19   Marrian Salvage, FNP    Physical Exam: Vitals:   03/28/21 1600 03/28/21 1730 03/28/21 1745 03/28/21 1836  BP: 118/84 114/86 104/72 110/80  Pulse: (!) 106 (!) 121 (!) 126 (!) 108  Resp: 19 16 18 18   Temp:    98.3 F (36.8 C)  TempSrc:    Oral  SpO2: 98% 98% 99% 98%  Weight:      Height:        Constitutional: NAD, calm  Eyes: PERTLA, lids and conjunctivae normal ENMT: Mucous membranes are moist. Posterior pharynx clear of any exudate or lesions.   Neck: supple, no masses  Respiratory:  no wheezing, no crackles. No accessory muscle use.  Cardiovascular: Rate ~60 and irregularly irregular. No extremity edema.   Abdomen: No distension, no tenderness, soft. Bowel sounds active.  Musculoskeletal: no clubbing / cyanosis. No joint deformity upper and lower extremities.   Skin: no significant rashes, lesions, ulcers. Warm, dry, well-perfused. Neurologic: CN 2-12 grossly intact. Moving all  extremities. Alert and oriented.  Psychiatric: Very pleasant. Cooperative.    Labs and Imaging on Admission: I have personally reviewed following labs and imaging studies  CBC: Recent Labs  Lab 03/28/21 1357  WBC 7.1  NEUTROABS 4.8  HGB 13.7  HCT 39.7  MCV 96.8  PLT 99991111   Basic Metabolic Panel: Recent Labs  Lab 03/28/21 1357  NA 133*  K 3.9  CL 98  CO2 26  GLUCOSE 96  BUN 15  CREATININE 0.66  CALCIUM 8.8*   GFR: Estimated Creatinine Clearance: 76.8 mL/min (by C-G formula based on SCr of 0.66 mg/dL). Liver Function Tests: Recent Labs  Lab 03/28/21 1357  AST 42*  ALT 25  ALKPHOS 54  BILITOT 0.8  PROT 6.6  ALBUMIN 3.6   Recent Labs  Lab 03/28/21 1357  LIPASE 25   No results for input(s): AMMONIA in the last 168 hours. Coagulation Profile: Recent Labs  Lab 03/28/21 1357  INR 1.0   Cardiac Enzymes: No  results for input(s): CKTOTAL, CKMB, CKMBINDEX, TROPONINI in the last 168 hours. BNP (last 3 results) No results for input(s): PROBNP in the last 8760 hours. HbA1C: No results for input(s): HGBA1C in the last 72 hours. CBG: No results for input(s): GLUCAP in the last 168 hours. Lipid Profile: No results for input(s): CHOL, HDL, LDLCALC, TRIG, CHOLHDL, LDLDIRECT in the last 72 hours. Thyroid Function Tests: No results for input(s): TSH, T4TOTAL, FREET4, T3FREE, THYROIDAB in the last 72 hours. Anemia Panel: No results for input(s): VITAMINB12, FOLATE, FERRITIN, TIBC, IRON, RETICCTPCT in the last 72 hours. Urine analysis:    Component Value Date/Time   COLORURINE YELLOW 03/28/2021 1320   APPEARANCEUR CLEAR 03/28/2021 1320   LABSPEC 1.012 03/28/2021 1320   PHURINE 6.5 03/28/2021 1320   GLUCOSEU NEGATIVE 03/28/2021 1320   HGBUR NEGATIVE 03/28/2021 1320   BILIRUBINUR NEGATIVE 03/28/2021 1320   BILIRUBINUR negative 06/21/2019 1408   KETONESUR NEGATIVE 03/28/2021 1320   PROTEINUR NEGATIVE 03/28/2021 1320   UROBILINOGEN 0.2 06/21/2019 1408    UROBILINOGEN >8.0 (H) 07/25/2011 1600   NITRITE NEGATIVE 03/28/2021 1320   LEUKOCYTESUR NEGATIVE 03/28/2021 1320   Sepsis Labs: @LABRCNTIP (procalcitonin:4,lacticidven:4) ) Recent Results (from the past 240 hour(s))  Resp Panel by RT-PCR (Flu A&B, Covid) Nasopharyngeal Swab     Status: Abnormal   Collection Time: 03/28/21  1:37 PM   Specimen: Nasopharyngeal Swab; Nasopharyngeal(NP) swabs in vial transport medium  Result Value Ref Range Status   SARS Coronavirus 2 by RT PCR POSITIVE (A) NEGATIVE Final    Comment: (NOTE) SARS-CoV-2 target nucleic acids are DETECTED.  The SARS-CoV-2 RNA is generally detectable in upper respiratory specimens during the acute phase of infection. Positive results are indicative of the presence of the identified virus, but do not rule out bacterial infection or co-infection with other pathogens not detected by the test. Clinical correlation with patient history and other diagnostic information is necessary to determine patient infection status. The expected result is Negative.  Fact Sheet for Patients: EntrepreneurPulse.com.au  Fact Sheet for Healthcare Providers: IncredibleEmployment.be  This test is not yet approved or cleared by the [place] FDA and  has been authorized for detection and/or diagnosis of SARS-CoV-2 by FDA under an Emergency Use Authorization (EUA).  This EUA will remain in effect (meaning this test can be used) for the duration of  the COVID-19 declaration under Section 564(b)(1) of the A ct, 21 U.S.C. section 360bbb-3(b)(1), unless the authorization is terminated or revoked sooner.     Influenza A by PCR NEGATIVE NEGATIVE Final   Influenza B by PCR NEGATIVE NEGATIVE Final    Comment: (NOTE) The Xpert Xpress SARS-CoV-2/FLU/RSV plus assay is intended as an aid in the diagnosis of influenza from Nasopharyngeal swab specimens and should not be used as a sole basis for treatment. Nasal  washings and aspirates are unacceptable for Xpert Xpress SARS-CoV-2/FLU/RSV testing.  Fact Sheet for Patients: EntrepreneurPulse.com.au  Fact Sheet for Healthcare Providers: IncredibleEmployment.be  This test is not yet approved or cleared by the [place] FDA and has been authorized for detection and/or diagnosis of SARS-CoV-2 by FDA under an Emergency Use Authorization (EUA). This EUA will remain in effect (meaning this test can be used) for the duration of the COVID-19 declaration under Section 564(b)(1) of the Act, 21 U.S.C. section 360bbb-3(b)(1), unless the authorization is terminated or revoked.  Performed at [facility], [address], [place], [place] [address]      Radiological Exams on Admission: DG Chest 1 View  Result Date:  03/28/2021 CLINICAL DATA:  COVID, AFib, anxiety. EXAM: CHEST  1 VIEW COMPARISON:  Chest x-ray 09/20/2011. FINDINGS: The heart size and mediastinal contours are within normal limits. Both lungs are clear. The visualized skeletal structures are unremarkable. IMPRESSION: No active disease. Electronically Signed   By: Ronney Asters M.D.   On: 03/28/2021 17:31    EKG: Independently reviewed. Atrial fibrillation, rate 143.   Assessment/Plan  1. Atrial fibrillation with RVR, new-onset  - Presents with rapid palpitations and lightheadedness and found to be in rapid a fib with rate 140s-160s  - Had been "woozy" for a few days, unclear if d/t covid or new a fib  - IV diltiazem, oral metoprolol, and IV heparin were started in ED  - CHADS-VASc at least 3 (gender, age, HTN)  - Unclear precipitant, possibly COVID though she has minimal if any symptoms - Check TSH and echocardiogram, stop IV heparin and start Eliquis, stop IV diltiazem and continue oral metoprolol with titration    2. COVID-19  - Had been "woozy" for a few days prior to presentation, unclear if d/t COVID or new a fib but no  respiratory s/s  - She has had 5 COVID vaccinations  - Inflammatory markers ordered from ED and pending  - She is not interested in any specific covid treatment currently  - Continue isolation, supportive care    3. Hypertension  - BP is at goal, will hold her home antihypertensives for now while initiating rate-control medications   4. Asthma  - Well-controlled, not in exacerbation, has tolerated Coreg   - Continue ICS/LABA and as-needed SABA     DVT prophylaxis: Eliquis  Code Status: Full  Level of Care: Level of care: Progressive Family Communication: Sister at bedside   Disposition Plan:  Patient is from: home  Anticipated d/c is to: Home  Anticipated d/c date is: 03/30/21  Patient currently: pending rate-control, conversion to oral medications  Consults called: none  Admission status: Inpatient     Vianne Bulls, MD [facility]  03/28/2021, 7:48 PM

## 2021-03-28 NOTE — ED Provider Notes (Signed)
[facility] EMERGENCY DEPT Provider Note   CSN: [id number] Arrival date & time: 03/28/21  1201     History  Chief Complaint  Patient presents with   Palpitations   Anxiety    Gabriella Nguyen is a 66 y.o. female.  HPI Patient reports that she went to church this morning.  She reports that she went to 2 separate services.  She got home later midmorning.  She started preparing some food and all of a sudden felt quite lightheaded and then noted that her heart was racing, she did not have a syncopal episode.  Patient reports it made her feel very weak and she was able to use her home monitor to identify that her heart rate was very elevated.  She has never had history of atrial fibrillation or dysrhythmias she is aware of.  Patient reports that she cannot think of a time specifically when she has had racing heart or been aware of it but could not say that it never happens.  Patient reports she has been a little bit more fatigued and gotten " woozy" off-and-on.  Patient son had been attributing it to low blood sugar because she does not eat until she notices some symptoms.  She reports she feels better after she eats.  Patient does not smoke or drink alcohol.  Patient son has history of paroxysmal atrial fibrillation.  He reports he is frequently asymptomatic when he is in A. fib.    Home Medications Prior to Admission medications   Medication Sig Start Date End Date Taking? Authorizing Provider  acetaminophen (TYLENOL) 500 MG tablet Take 1,000 mg by mouth every 6 (six) hours as needed for moderate pain or headache.    [provider]  albuterol (PROVENTIL,VENTOLIN) 90 MCG/ACT inhaler Inhale 2 puffs into the lungs every 6 (six) hours as needed for wheezing or shortness of breath.  09/02/10   Norins, Rosalyn Gess, MD  albuterol (VENTOLIN HFA) 108 (90 Base) MCG/ACT inhaler Inhale 2 puffs into the lungs every 6 (six) hours as needed for wheezing or shortness of breath. 11/20/18    Olive Bass, FNP  carvedilol (COREG) 12.5 MG tablet TAKE 1/2 TABLET TWICE DAILY WITH MEALS 06/26/19   Olive Bass, FNP  losartan (COZAAR) 100 MG tablet TAKE 1 TABLET EVERY DAY 06/06/19   Olive Bass, FNP  mometasone-formoterol [facility]) 200-5 MCG/ACT AERO Inhale 2 puffs into the lungs 2 (two) times daily. 11/20/18   Olive Bass, FNP  potassium chloride (K-DUR,KLOR-CON) 10 MEQ tablet Take 1 tablet (10 mEq total) by mouth daily. 06/05/18   Olive Bass, FNP  spironolactone-hydrochlorothiazide (ALDACTAZIDE) 25-25 MG tablet TAKE 1 TABLET EVERY DAY 06/06/19   Olive Bass, FNP  sulfamethoxazole-trimethoprim (BACTRIM DS) 800-160 MG tablet Take 1 tablet by mouth 2 (two) times daily. 07/05/19   Olive Bass, FNP  triamcinolone cream (KENALOG) 0.1 % Apply 1 application topically 2 (two) times daily. 06/21/19   Olive Bass, FNP      Allergies    Nifedipine    Review of Systems   Review of Systems 10 systems reviewed and negative except as per HPI Physical Exam Updated Vital Signs BP 103/81    Pulse (!) 29    Temp 98 F (36.7 C) (Oral)    Resp 17    Ht 6' (1.829 m)    Wt 70.3 kg    SpO2 98%    BMI 21.02 kg/m  Physical Exam Constitutional:  Appearance: Normal appearance.     Comments: Patient is thin.  Mental status clear.  No respiratory distress at rest.  HENT:     Mouth/Throat:     Pharynx: Oropharynx is clear.  Eyes:     Extraocular Movements: Extraocular movements intact.  Cardiovascular:     Comments: Tachycardia, irregularly irregular. Pulmonary:     Effort: Pulmonary effort is normal.     Breath sounds: Normal breath sounds.  Abdominal:     General: There is no distension.     Palpations: Abdomen is soft.     Tenderness: There is no abdominal tenderness. There is no guarding.  Musculoskeletal:        General: Normal range of motion.     Right lower leg: No edema.     Left lower leg: No edema.   Skin:    General: Skin is warm and dry.  Neurological:     General: No focal deficit present.     Mental Status: She is alert and oriented to person, place, and time.     Motor: No weakness.     Coordination: Coordination normal.  Psychiatric:        Mood and Affect: Mood normal.    ED Results / Procedures / Treatments   Labs (all labs ordered are listed, but only abnormal results are displayed) Labs Reviewed  RESP PANEL BY RT-PCR (FLU A&B, COVID) ARPGX2 - Abnormal; Notable for the following components:      Result Value   SARS Coronavirus 2 by RT PCR POSITIVE (*)    All other components within normal limits  COMPREHENSIVE METABOLIC PANEL - Abnormal; Notable for the following components:   Sodium 133 (*)    Calcium 8.8 (*)    AST 42 (*)    All other components within normal limits  BRAIN NATRIURETIC PEPTIDE  LIPASE, BLOOD  CBC WITH DIFFERENTIAL/PLATELET  PROTIME-INR  URINALYSIS, ROUTINE W REFLEX MICROSCOPIC  TROPONIN I (HIGH SENSITIVITY)  TROPONIN I (HIGH SENSITIVITY)    EKG EKG Interpretation  Date/Time:  Sunday March 28 2021 14:05:20 EST Ventricular Rate:  143 PR Interval:    QRS Duration: 94 QT Interval:  301 QTC Calculation: 465 R Axis:   81 Text Interpretation: Atrial fibrillation Borderline right axis deviation Minimal ST depression, inferior leads improved baseline, afib rvr Confirmed by Arby BarrettePfeiffer, Alekzander Cardell [id number]) on 03/28/2021 4:04:01 PM  Radiology No results found.  Procedures Procedures  CRITICAL CARE Performed by: Arby BarretteMarcy Yarely Bebee   Total critical care time: 30 minutes  Critical care time was exclusive of separately billable procedures and treating other patients.  Critical care was necessary to treat or prevent imminent or life-threatening deterioration.  Critical care was time spent personally by me on the following activities: development of treatment plan with patient and/or surrogate as well as nursing, discussions with consultants, evaluation  of patient's response to treatment, examination of patient, obtaining history from patient or surrogate, ordering and performing treatments and interventions, ordering and review of laboratory studies, ordering and review of radiographic studies, pulse oximetry and re-evaluation of patient's condition.   Medications Ordered in ED Medications  diltiazem (CARDIZEM) 1 mg/mL load via infusion 15 mg (15 mg Intravenous Bolus from Bag 03/28/21 1539)    And  diltiazem (CARDIZEM) 125 mg in dextrose 5% 125 mL (1 mg/mL) infusion (5 mg/hr Intravenous New Bag/Given 03/28/21 1539)  sodium chloride 0.9 % bolus 500 mL (500 mLs Intravenous New Bag/Given 03/28/21 1542)    ED Course/ Medical Decision Making/ A&P  Medical Decision Making  Patient presents with feeling of lightheadedness and racing heart.  Patient denies chest pain.  She did not have syncopal episode.  Patient does not have prior history of atrial fibrillation.  She identified rapid heart rate at home.  On monitor patient has atrial fibrillation with rapid ventricular response.  No prior history.  At this time, patient does not perceive racing heart although her heart rate is in the 160s.  Cardizem bolus and drip initiated.  Heart rate has responded coming down to the low 100s.  Incidentally, patient has tested positive for COVID.  This may explain some of her feeling over the past several days of being fatigued and a little "woozy".  At this time difficult to be certain if patient has been experiencing paroxysmal atrial fibrillation and asymptomatic.  She does not perceive racing or palpitation with a heart rate in the 160s.  Patient's son who does have paroxysmal atrial fibrillation reports that he often cannot tell that his heart rate is elevated.  Patient is responding to Cardizem.  At this time will turn case over to Dr. Deretha Emory for reassessment and definitive management for admission versus rate control and anticoagulation  based on consultation with cardiology.  Final Clinical Impression(s) / ED Diagnoses Final diagnoses:  Atrial fibrillation with rapid ventricular response [facility])    Rx / DC Orders ED Discharge Orders     None         Arby Barrette, MD 03/28/21 1611

## 2021-03-28 NOTE — Progress Notes (Signed)
ANTICOAGULATION CONSULT NOTE - Initial Consult  Pharmacy Consult for Heparin Indication: atrial fibrillation  Allergies  Allergen Reactions   Nifedipine Swelling and Other (See Comments)    Swelling gums     Patient Measurements: Height: 6' (182.9 cm) Weight: 70.3 kg (155 lb) IBW/kg (Calculated) : 73.1 Heparin Dosing Weight: 70.3kg  Vital Signs: Temp: 98 F (36.7 C) (01/01 1213) Temp Source: Oral (01/01 1213) BP: 118/84 (01/01 1600) Pulse Rate: 106 (01/01 1600)  Labs: Recent Labs    03/28/21 1357 03/28/21 1520  HGB 13.7  --   HCT 39.7  --   PLT 218  --   LABPROT 13.4  --   INR 1.0  --   CREATININE 0.66  --   TROPONINIHS 13 17    Estimated Creatinine Clearance: 76.8 mL/min (by C-G formula based on SCr of 0.66 mg/dL).   Medical History: Past Medical History:  Diagnosis Date   Aspiration pneumonia ([place]) 06/2011 hosp   ASTHMA    OSTEOARTHRITIS, KNEES, BILATERAL    Unspecified essential hypertension     Medications:  (Not in a hospital admission)  Scheduled:   heparin  4,000 Units Intravenous Once   metoprolol tartrate  25 mg Oral BID   Infusions:   diltiazem (CARDIZEM) infusion 5 mg/hr (03/28/21 1539)   heparin     PRN:   Assessment: 25 yof with a history of pAF. Patient is presenting with palpitations. Heparin per pharmacy consult placed for atrial fibrillation.  Patient is not on anticoagulation prior to arrival.  Hgb 13.7; plt 218  Goal of Therapy:  Heparin level 0.3-0.7 units/ml Monitor platelets by anticoagulation protocol: Yes   Plan:  Give 4000 units bolus x 1 Start heparin infusion at 1100 units/hr Check anti-Xa level in 6 hours and daily while on heparin Continue to monitor H&H and platelets  Lorelei Pont, PharmD, BCPS 03/28/2021 4:52 PM ED Clinical Pharmacist -  [phone]

## 2021-03-29 ENCOUNTER — Other Ambulatory Visit (HOSPITAL_COMMUNITY): Payer: Self-pay

## 2021-03-29 DIAGNOSIS — I4891 Unspecified atrial fibrillation: Secondary | ICD-10-CM | POA: Diagnosis not present

## 2021-03-29 DIAGNOSIS — E440 Moderate protein-calorie malnutrition: Secondary | ICD-10-CM | POA: Insufficient documentation

## 2021-03-29 LAB — COMPREHENSIVE METABOLIC PANEL
ALT: 25 U/L (ref 0–44)
AST: 38 U/L (ref 15–41)
Albumin: 3.1 g/dL — ABNORMAL LOW (ref 3.5–5.0)
Alkaline Phosphatase: 55 U/L (ref 38–126)
Anion gap: 10 (ref 5–15)
BUN: 9 mg/dL (ref 8–23)
CO2: 23 mmol/L (ref 22–32)
Calcium: 8.7 mg/dL — ABNORMAL LOW (ref 8.9–10.3)
Chloride: 99 mmol/L (ref 98–111)
Creatinine, Ser: 0.68 mg/dL (ref 0.44–1.00)
GFR, Estimated: >60 mL/min (ref >60)
Glucose, Bld: 109 mg/dL — ABNORMAL HIGH (ref 70–99)
Potassium: 3.4 mmol/L — ABNORMAL LOW (ref 3.5–5.1)
Sodium: 132 mmol/L — ABNORMAL LOW (ref 135–145)
Total Bilirubin: 1.4 mg/dL — ABNORMAL HIGH (ref 0.3–1.2)
Total Protein: 5.7 g/dL — ABNORMAL LOW (ref 6.5–8.1)

## 2021-03-29 LAB — CBC WITH DIFFERENTIAL/PLATELET
Abs Immature Granulocytes: 0.01 10*3/uL (ref 0.00–0.07)
Basophils Absolute: 0.1 10*3/uL (ref 0.0–0.1)
Basophils Relative: 1 %
Eosinophils Absolute: 0.1 10*3/uL (ref 0.0–0.5)
Eosinophils Relative: 2 %
HCT: 37.5 % (ref 36.0–46.0)
Hemoglobin: 13.3 g/dL (ref 12.0–15.0)
Immature Granulocytes: 0 %
Lymphocytes Relative: 28 %
Lymphs Abs: 1.7 10*3/uL (ref 0.7–4.0)
MCH: 34.6 pg — ABNORMAL HIGH (ref 26.0–34.0)
MCHC: 35.5 g/dL (ref 30.0–36.0)
MCV: 97.7 fL (ref 80.0–100.0)
Monocytes Absolute: 0.7 10*3/uL (ref 0.1–1.0)
Monocytes Relative: 11 %
Neutro Abs: 3.5 10*3/uL (ref 1.7–7.7)
Neutrophils Relative %: 58 %
Platelets: 212 10*3/uL (ref 150–400)
RBC: 3.84 MIL/uL — ABNORMAL LOW (ref 3.87–5.11)
RDW: 12.5 % (ref 11.5–15.5)
WBC: 6 10*3/uL (ref 4.0–10.5)
nRBC: 0 % (ref 0.0–0.2)

## 2021-03-29 LAB — PHOSPHORUS: Phosphorus: 2.9 mg/dL (ref 2.5–4.6)

## 2021-03-29 LAB — HIV ANTIBODY (ROUTINE TESTING W REFLEX): HIV Screen 4th Generation wRfx: NONREACTIVE

## 2021-03-29 LAB — PROCALCITONIN: Procalcitonin: <0.1 ng/mL

## 2021-03-29 LAB — FERRITIN: Ferritin: 72 ng/mL (ref 11–307)

## 2021-03-29 LAB — FIBRINOGEN: Fibrinogen: 237 mg/dL (ref 210–475)

## 2021-03-29 LAB — MAGNESIUM: Magnesium: 1.6 mg/dL — ABNORMAL LOW (ref 1.7–2.4)

## 2021-03-29 LAB — C-REACTIVE PROTEIN: CRP: <0.5 mg/dL (ref <1.0)

## 2021-03-29 LAB — LACTATE DEHYDROGENASE: LDH: 161 U/L (ref 98–192)

## 2021-03-29 LAB — TSH: TSH: 1.156 u[IU]/mL (ref 0.350–4.500)

## 2021-03-29 LAB — D-DIMER, QUANTITATIVE: D-Dimer, Quant: 0.44 ug/mL-FEU (ref 0.00–0.50)

## 2021-03-29 MED ORDER — MAGNESIUM SULFATE 2 GM/50ML IV SOLN
2.0000 g | Freq: Once | INTRAVENOUS | Status: AC
  Administered 2021-03-29: 2 g via INTRAVENOUS
  Filled 2021-03-29: qty 50

## 2021-03-29 MED ORDER — ADULT MULTIVITAMIN W/MINERALS CH
1.0000 | ORAL_TABLET | Freq: Every day | ORAL | Status: DC
  Administered 2021-03-29 – 2021-03-31 (×3): 1 via ORAL
  Filled 2021-03-29 (×3): qty 1

## 2021-03-29 MED ORDER — POTASSIUM CHLORIDE CRYS ER 20 MEQ PO TBCR
20.0000 meq | EXTENDED_RELEASE_TABLET | ORAL | Status: AC
  Administered 2021-03-29 (×2): 20 meq via ORAL
  Filled 2021-03-29 (×2): qty 1

## 2021-03-29 MED ORDER — ENSURE ENLIVE PO LIQD
237.0000 mL | Freq: Two times a day (BID) | ORAL | Status: DC
  Administered 2021-03-30: 237 mL via ORAL

## 2021-03-29 NOTE — Assessment & Plan Note (Addendum)
K 3.4 replaced >> 3.7 --Monitor and replace K, Mg as needed --Goal K>4, Mg>2 given Afib RVR

## 2021-03-29 NOTE — Assessment & Plan Note (Signed)
BP soft/low today after AM metoprolol. --Started on metoprolol, continue --Home antihypertensives are held (Coreg, losartan, Aldactazide) --Likely d/c Coreg in favor of metoprolol if BP stays soft

## 2021-03-29 NOTE — Progress Notes (Signed)
°  Transition of Care [facility]) Screening Note   Patient Details  Name: HILARIA TITSWORTH Date of Birth: [birth year]   Transition of Care [facility]) CM/SW Contact:    Delilah Shan, LCSWA Phone Number: 03/29/2021, 5:08 PM    Transition of Care Department [facility]) has reviewed patient and no TOC needs have been identified at this time. We will continue to monitor patient advancement through interdisciplinary progression rounds. If new patient transition needs arise, please place a TOC consult.

## 2021-03-29 NOTE — Progress Notes (Signed)
Progress Note   [name] [id number] DOB: 08-30-1955 DOA: 03/28/2021     1 DOS: the patient was seen and examined on 03/29/2021   Brief hospital course: Gabriella Nguyen is a 66 y.o. female with medical history significant for asthma and hypertension, presented to the [place] [place] ED on 03/28/2021 for evaluation of rapid palpitations and lightheadedness, no syncope.  Recently treated with Augmentin for respiratory infection last month which she feels has completely resolved.  She had a CT of her chest, abdomen, and pelvis last month prior to the antibiotic that was notable for moderate coronary calcifications and nodular tree-in-bud opacities in the right upper lobe with debris in the right mainstem bronchus suggestive of aspiration or infection.    In the ED, vitals normal except for heart rate in the 140s to 160s with EKG showing A-fib RVR with rate of 143 bpm.  Chest x-ray negative.  Labs notable for mild hyponatremia, CBC unremarkable.  COVID PCR was positive incidentally.  Patient was started on Cardizem infusion after bolus, IV heparin.  [facility].  Assessment and Plan * Atrial fibrillation with RVR ([place])- (present on admission) Presented with rapid palpitations, lightheadedness and EKG with A-fib RVR with rate in 140s-160s  Started on IV diltiazem, oral metoprolol, and IV heparin were in ED.  CHADS-VASc at least 3 (gender, age, HTN)  Unclear precipitant, possibly COVID though she denies having any covid symptoms. TSH normal. 1/2: HR's in 90's to low 100's on monitor this AM.  BP soft but MAP stable. --Follow up Echo, pending --Started on Eliquis  --Off IV diltiazem  --PO Lopressor --Telemetry --Monitor and replace electrolytes as needed  Malnutrition of moderate degree related to acute illness (COVID+) as evidenced by moderate fat depletion, severe muscle depletion.  --Appreciate dietitian input --Started on Ensure drinks,  MVI --Encourage PO intake  COVID-19 virus detected- (present on admission) Patient denies any Covid symptoms, was surprised to test positive.  Has received 5 Covid vaccines. No Covid-specific treatment indicated and pt declines this.   --Airborne and contact precautions --Monitor clinically  Asthma- (present on admission) Not acutely exacerbated. Tolerates Coreg. --Continue ICS/LABA and PRN SABA  Essential hypertension- (present on admission) BP soft/low today after AM metoprolol. --Started on metoprolol, continue --Home antihypertensives are held (Coreg, losartan, Aldactazide) --Likely d/c Coreg in favor of metoprolol if BP stays soft     Subjective: Pt sitting up in bed when seen today.  Denies palpitations this AM.  Says she was surprised she tested positive for Covid, no symptoms and has felt overall fine recently aside from "woozy" spells with palpitations.  No fever/chills, cough, SOB or GI complaints.   Objective Vitals notable for HR 90's >>100's-110's on monitor, BP soft but stable MAP's, no fevers, no hypoxia  General exam: awake, alert, no acute distress HEENT: atraumatic, clear conjunctiva, anicteric sclera, moist mucus membranes, hearing grossly normal  Respiratory system: CTAB, no wheezes, rales or rhonchi, normal respiratory effort. Cardiovascular system: normal S1/S2, irregularly irregularno pedal edema.   Central nervous system: A&O x3. no gross focal neurologic deficits, normal speech Extremities: moves all, no edema, normal tone Psychiatry: normal mood, congruent affect, judgement and insight appear normal   Data Reviewed: Labs notable for Na 133>>132, K 3.9>>3.4, glucose 109, albumin 3.6>>3.1, Tbili 0.9>>1.4, BNP 161, troponin normal x 2,  procalcitonin negative, CRP < 0.5, CBC unremarkable, TSH normal 1.156, HIV screen non-reactive  Family Communication: female at bedside during rounds today  Disposition:  Status is: Inpatient  Remains inpatient  appropriate because: Soft BP and HR not yet adequately controlled.         Time spent: 30 minutes  Author: Ezekiel Slocumb 03/29/2021 5:25 PM  For on call review [email or web address].

## 2021-03-29 NOTE — Assessment & Plan Note (Signed)
Not acutely exacerbated. Tolerates Coreg. --Continue ICS/LABA and PRN SABA

## 2021-03-29 NOTE — Hospital Course (Signed)
Gabriella Nguyen is a 66 y.o. female with medical history significant for asthma and hypertension, presented to the [facility] ED on 03/28/2021 for evaluation of rapid palpitations and lightheadedness, no syncope.  Recently treated with Augmentin for respiratory infection last month which she feels has completely resolved.  She had a CT of her chest, abdomen, and pelvis last month prior to the antibiotic that was notable for moderate coronary calcifications and nodular tree-in-bud opacities in the right upper lobe with debris in the right mainstem bronchus suggestive of aspiration or infection.    In the ED, vitals normal except for heart rate in the 140s to 160s with EKG showing A-fib RVR with rate of 143 bpm.  Chest x-ray negative.  Labs notable for mild hyponatremia, CBC unremarkable.  COVID PCR was positive incidentally.  Patient was started on Cardizem infusion after bolus, IV heparin.  Transferred to [facility] for admission to [facility] service.

## 2021-03-29 NOTE — Progress Notes (Signed)
Initial Nutrition Assessment  DOCUMENTATION CODES:   Non-severe (moderate) malnutrition in context of acute illness/injury  INTERVENTION:  - Encourage PO intake  - Ensure Enlive po BID, each supplement provides 350 kcal and 20 grams of protein  - MVI with minerals daily  NUTRITION DIAGNOSIS:   Moderate Malnutrition related to acute illness (COVID+) as evidenced by moderate fat depletion, severe muscle depletion.  GOAL:   Patient will meet greater than or equal to 90% of their needs  MONITOR:   PO intake, Supplement acceptance, Labs, Weight trends  REASON FOR ASSESSMENT:   Malnutrition Screening Tool    ASSESSMENT:   Pt admitted from home with palpitations and lightheaded secondary to new-onset afib with RVR. Pt was treated for respiratory infection 1 month ago. PMH includes asthma and HTN  Pt is now found to be COVID+.  Pt reports usually eating 2 meals per day. Her typical intake includes [place] sausage patties, seeded toast, eggs, chicken, shrimp or salmon, beans, and vegetables. She states that she just has not had as much of an appetite recently and finds that she is eating less. Despite having a decrease in appetite, she reports trying to eat something small. We spoke about balanced nutrition, protein for muscle maintenance, and the benefits of whole grains for fiber content. We also discussed options for small snacks throughout the day when she may not have an appetite for a meal including   Limited documentation of weight history. Pt reports having weighed 156 lbs 2 weeks ago at the [place]. She reports a current weight of 147 lbs. Unable to confirm this weight loss from documented weight history. Will continue to monitor.  Admit weight: 70.3 kg  Pt meets criteria for acute malnutrition however, suspect some degree of underlying chronic malnutrition.  Medications: potassium chloride, IV magnesium  Labs: sodium 132, potassium 3.4, magnesium 1.6  NUTRITION - FOCUSED  PHYSICAL EXAM:  Flowsheet Row Most Recent Value  Orbital Region Mild depletion  Upper Arm Region Severe depletion  Thoracic and Lumbar Region Moderate depletion  Buccal Region Moderate depletion  Temple Region Severe depletion  Clavicle Bone Region Severe depletion  Clavicle and Acromion Bone Region Severe depletion  Scapular Bone Region Moderate depletion  Dorsal Hand Moderate depletion  Patellar Region Severe depletion  Anterior Thigh Region Severe depletion  Posterior Calf Region Severe depletion  Edema (RD Assessment) None  Hair Reviewed  Eyes Reviewed  Mouth Reviewed  Skin Reviewed  Nails Reviewed       Diet Order:   Diet Order             Diet Heart Room service appropriate? Yes; Fluid consistency: Thin  Diet effective now                   EDUCATION NEEDS:   Education needs have been addressed  Skin:  Skin Assessment: Reviewed RN Assessment  Last BM:  03/28/21  Height:   Ht Readings from Last 1 Encounters:  03/28/21 6' (1.829 m)    Weight:   Wt Readings from Last 1 Encounters:  03/28/21 70.3 kg    BMI:  Body mass index is 21.02 kg/m.  Estimated Nutritional Needs:   Kcal:  1800-2000  Protein:  90-100g  Fluid:  >/=1.8L  Drusilla Kanner, RDN, LDN Clinical Nutrition

## 2021-03-29 NOTE — Assessment & Plan Note (Addendum)
Patient denies any Covid symptoms other than mild cough past few weeks, was surprised to test positive.  Has received 5 Covid vaccines. No Covid-specific treatment indicated and pt declines this.   --Airborne and contact precautions --Monitor clinically

## 2021-03-29 NOTE — Discharge Instructions (Addendum)
Information on my medicine - ELIQUIS (apixaban) Why was Eliquis prescribed for you? Eliquis was prescribed for you to reduce the risk of a blood clot forming that can cause a stroke if you have a medical condition called atrial fibrillation (a type of irregular heartbeat).  What do You need to know about Eliquis ? Take your Eliquis TWICE DAILY - one tablet in the morning and one tablet in the evening with or without food. If you have difficulty swallowing the tablet whole please discuss with your pharmacist how to take the medication safely.  Take Eliquis exactly as prescribed by your doctor and DO NOT stop taking Eliquis without talking to the doctor who prescribed the medication.  Stopping may increase your risk of developing a stroke.  Refill your prescription before you run out.  After discharge, you should have regular check-up appointments with your healthcare provider that is prescribing your Eliquis.  In the future your dose may need to be changed if your kidney function or weight changes by a significant amount or as you get older.  What do you do if you miss a dose? If you miss a dose, take it as soon as you remember on the same day and resume taking twice daily.  Do not take more than one dose of ELIQUIS at the same time to make up a missed dose.  Important Safety Information A possible side effect of Eliquis is bleeding. You should call your healthcare provider right away if you experience any of the following: Bleeding from an injury or your nose that does not stop. Unusual colored urine (red or dark brown) or unusual colored stools (red or black). Unusual bruising for unknown reasons. A serious fall or if you hit your head (even if there is no bleeding).  Some medicines may interact with Eliquis and might increase your risk of bleeding or clotting while on Eliquis. To help avoid this, consult your healthcare provider or pharmacist prior to using any new prescription or  non-prescription medications, including herbals, vitamins, non-steroidal anti-inflammatory drugs (NSAIDs) and supplements.  This website has more information on Eliquis (apixaban): http://www.eliquis.com/eliquis/home        Nutrition [facility] Stay Proper nutrition can help your body recover from illness and injury.   Foods and beverages high in protein, vitamins, and minerals help rebuild muscle loss, promote healing, & reduce fall risk.   In addition to eating healthy foods, a nutrition shake is an easy, delicious way to get the nutrition you need during and after your hospital stay  It is recommended that you continue to drink 2 bottles per day of: Ensure for at least 1 month (30 days) after your hospital stay   Tips for adding a nutrition shake into your routine: As allowed, drink one with vitamins or medications instead of water or juice Enjoy one as a tasty mid-morning or afternoon snack Drink cold or make a milkshake out of it Drink one instead of milk with cereal or snacks Use as a coffee creamer   Available at the following grocery stores and pharmacies:           * [employer] [phone]  For COUPONS visit: www.ensure.com/join or http://dawson-may.com/   Suggested Substitutions Ensure Plus = Boost Plus = Carnation Breakfast Essentials = Boost Compact Ensure Active Clear = Boost Breeze Glucerna Shake = Boost Glucose Control = Carnation Breakfast Essentials SUGAR FREE

## 2021-03-29 NOTE — Assessment & Plan Note (Signed)
related to acute illness (COVID+) as evidenced by moderate fat depletion, severe muscle depletion. --Appreciate dietitian input --Started on Ensure drinks, MVI --Encourage PO intake

## 2021-03-29 NOTE — Assessment & Plan Note (Addendum)
Presented with rapid palpitations, lightheadedness and EKG with A-fib RVR with rate in 140s-160s  Started on IV diltiazem, oral metoprolol, and IV heparin were in ED.  CHADS-VASc at least 3 (gender, age, HTN)  Unclear precipitant, possibly COVID though she denies having any covid symptoms. TSH normal. Rate poor controlled and soft BP's on metoprolol. --Cardiology consulted --Started on amiodarone gtt --Follow up Echo --Started on Eliquis  --PO Lopressor with hold parameters for BP --Telemetry --Monitor and replace electrolytes as needed

## 2021-03-30 DIAGNOSIS — U071 COVID-19: Secondary | ICD-10-CM | POA: Diagnosis not present

## 2021-03-30 DIAGNOSIS — I4819 Other persistent atrial fibrillation: Secondary | ICD-10-CM

## 2021-03-30 DIAGNOSIS — F419 Anxiety disorder, unspecified: Secondary | ICD-10-CM | POA: Diagnosis present

## 2021-03-30 DIAGNOSIS — I959 Hypotension, unspecified: Secondary | ICD-10-CM

## 2021-03-30 DIAGNOSIS — I4891 Unspecified atrial fibrillation: Secondary | ICD-10-CM

## 2021-03-30 LAB — CBC WITH DIFFERENTIAL/PLATELET
Abs Immature Granulocytes: 0.02 10*3/uL (ref 0.00–0.07)
Basophils Absolute: 0 10*3/uL (ref 0.0–0.1)
Basophils Relative: 1 %
Eosinophils Absolute: 0.1 10*3/uL (ref 0.0–0.5)
Eosinophils Relative: 2 %
HCT: 36.8 % (ref 36.0–46.0)
Hemoglobin: 12.8 g/dL (ref 12.0–15.0)
Immature Granulocytes: 0 %
Lymphocytes Relative: 31 %
Lymphs Abs: 1.5 10*3/uL (ref 0.7–4.0)
MCH: 35 pg — ABNORMAL HIGH (ref 26.0–34.0)
MCHC: 34.8 g/dL (ref 30.0–36.0)
MCV: 100.5 fL — ABNORMAL HIGH (ref 80.0–100.0)
Monocytes Absolute: 0.5 10*3/uL (ref 0.1–1.0)
Monocytes Relative: 10 %
Neutro Abs: 2.7 10*3/uL (ref 1.7–7.7)
Neutrophils Relative %: 56 %
Platelets: 185 10*3/uL (ref 150–400)
RBC: 3.66 MIL/uL — ABNORMAL LOW (ref 3.87–5.11)
RDW: 12.5 % (ref 11.5–15.5)
WBC: 4.9 10*3/uL (ref 4.0–10.5)
nRBC: 0 % (ref 0.0–0.2)

## 2021-03-30 LAB — COMPREHENSIVE METABOLIC PANEL
ALT: 25 U/L (ref 0–44)
AST: 44 U/L — ABNORMAL HIGH (ref 15–41)
Albumin: 2.8 g/dL — ABNORMAL LOW (ref 3.5–5.0)
Alkaline Phosphatase: 51 U/L (ref 38–126)
Anion gap: 5 (ref 5–15)
BUN: 6 mg/dL — ABNORMAL LOW (ref 8–23)
CO2: 28 mmol/L (ref 22–32)
Calcium: 8.8 mg/dL — ABNORMAL LOW (ref 8.9–10.3)
Chloride: 102 mmol/L (ref 98–111)
Creatinine, Ser: 0.73 mg/dL (ref 0.44–1.00)
GFR, Estimated: >60 mL/min (ref >60)
Glucose, Bld: 112 mg/dL — ABNORMAL HIGH (ref 70–99)
Potassium: 3.7 mmol/L (ref 3.5–5.1)
Sodium: 135 mmol/L (ref 135–145)
Total Bilirubin: 0.8 mg/dL (ref 0.3–1.2)
Total Protein: 5.3 g/dL — ABNORMAL LOW (ref 6.5–8.1)

## 2021-03-30 LAB — PHOSPHORUS: Phosphorus: 3.1 mg/dL (ref 2.5–4.6)

## 2021-03-30 LAB — C-REACTIVE PROTEIN: CRP: <0.5 mg/dL (ref <1.0)

## 2021-03-30 LAB — D-DIMER, QUANTITATIVE: D-Dimer, Quant: <0.27 ug/mL-FEU (ref 0.00–0.50)

## 2021-03-30 LAB — MAGNESIUM: Magnesium: 1.9 mg/dL (ref 1.7–2.4)

## 2021-03-30 MED ORDER — AMIODARONE HCL IN DEXTROSE 360-4.14 MG/200ML-% IV SOLN
30.0000 mg/h | INTRAVENOUS | Status: AC
  Administered 2021-03-30: 30 mg/h via INTRAVENOUS
  Filled 2021-03-30: qty 200

## 2021-03-30 MED ORDER — HYDROXYZINE HCL 25 MG PO TABS: 25.0000 mg | ORAL_TABLET | Freq: Three times a day (TID) | ORAL | Status: DC | PRN

## 2021-03-30 MED ORDER — SODIUM CHLORIDE 0.9 % IV BOLUS
500.0000 mL | Freq: Once | INTRAVENOUS | Status: AC
  Administered 2021-03-30: 500 mL via INTRAVENOUS

## 2021-03-30 MED ORDER — AMIODARONE LOAD VIA INFUSION
150.0000 mg | Freq: Once | INTRAVENOUS | Status: AC
  Administered 2021-03-30: 150 mg via INTRAVENOUS
  Filled 2021-03-30: qty 83.34

## 2021-03-30 MED ORDER — AMIODARONE HCL IN DEXTROSE 360-4.14 MG/200ML-% IV SOLN
60.0000 mg/h | INTRAVENOUS | Status: AC
  Administered 2021-03-30: 60 mg/h via INTRAVENOUS
  Filled 2021-03-30 (×2): qty 200

## 2021-03-30 NOTE — Progress Notes (Addendum)
Progress Note   [name] [id number] DOB: [birth year] DOA: 03/28/2021     1 DOS: the patient was seen and examined on 03/30/2021   Brief hospital course: Gabriella Nguyen is a 66 y.o. female with medical history significant for asthma and hypertension, presented to the [facility] ED on 03/28/2021 for evaluation of rapid palpitations and lightheadedness, no syncope.  Recently treated with Augmentin for respiratory infection last month which she feels has completely resolved.  She had a CT of her chest, abdomen, and pelvis last month prior to the antibiotic that was notable for moderate coronary calcifications and nodular tree-in-bud opacities in the right upper lobe with debris in the right mainstem bronchus suggestive of aspiration or infection.    In the ED, vitals normal except for heart rate in the 140s to 160s with EKG showing A-fib RVR with rate of 143 bpm.  Chest x-ray negative.  Labs notable for mild hyponatremia, CBC unremarkable.  COVID PCR was positive incidentally.  Patient was started on Cardizem infusion after bolus, IV heparin.  [facility] service.  Assessment and Plan * Atrial fibrillation with RVR ([place])- (present on admission) Presented with rapid palpitations, lightheadedness and EKG with A-fib RVR with rate in 140s-160s  Started on IV diltiazem, oral metoprolol, and IV heparin were in ED.  CHADS-VASc at least 3 (gender, age, HTN)  Unclear precipitant, possibly COVID though she denies having any covid symptoms. TSH normal. Rate poor controlled and soft BP's on metoprolol. --Cardiology consulted --Started on amiodarone gtt --Follow up Echo --Started on Eliquis  --PO Lopressor with hold parameters for BP --Telemetry --Monitor and replace electrolytes as needed  Anxiety- (present on admission) Pt reports long hx of near-panic episodes of anxiety related to various situations / stressors.  Not on medication for this.    --Trial PRN hydroxyzine --Discussed deep breathing techniques --Outpatient follow up for long-term mgmt  Malnutrition of moderate degree related to acute illness (COVID+) as evidenced by moderate fat depletion, severe muscle depletion.  --Appreciate dietitian input --Started on Ensure drinks, MVI --Encourage PO intake  COVID-19 virus detected- (present on admission) Patient denies any Covid symptoms other than mild cough past few weeks, was surprised to test positive.  Has received 5 Covid vaccines. No Covid-specific treatment indicated and pt declines this.   --Airborne and contact precautions --Monitor clinically  Hypokalemia- (present on admission) K 3.4 replaced >> 3.7 --Monitor and replace K, Mg as needed --Goal K>4, Mg>2 given Afib RVR  Asthma- (present on admission) Not acutely exacerbated. Tolerates Coreg. --Continue ICS/LABA and PRN SABA  Essential hypertension- (present on admission) BP soft/low today after AM metoprolol. --Started on metoprolol, continue --Home antihypertensives are held (Coreg, losartan, Aldactazide) --Likely d/c Coreg in favor of metoprolol if BP stays soft    Hypomagenesmia - Mg 1.6, replaced. Monitor and replace as needed. Goal Mg>2 given A-fib.   Subjective: Pt awake sitting up in bed, son at bedside.  Pt denies palpitations, dizziness, lightheadedness or other complaints.  Reports being quite anxious, mostly situational, causes near-panic at times (ex elevators, various stressors), worse being in hospital.  Not on medication for this, often can calm herself down with deep breathing.    Objective HR in 90's to 120's on monitor, BP soft but stable MAP Afebrile.  General exam: awake, alert, no acute distress HEENT: atraumatic, clear conjunctiva, anicteric sclera, moist mucus membranes, hearing grossly normal  Respiratory system: CTAB, no wheezes, rales or rhonchi, normal respiratory effort. Cardiovascular  system: normal S1/S2, irregularly  irregular.   Central nervous system: A&O x4. no gross focal neurologic deficits, normal speech Extremities: moves all , no edema, normal tone Skin: dry, intact, normal temperature Psychiatry: normal mood, congruent affect, judgement and insight appear normal   Data Reviewed: Labs notable for glucose 112, K 3.4>>3.7, Mg 1.6>>1.9, Albumin 2.8,   Family Communication: son at bedside on rounds  Disposition: Status is: Inpatient  Remains inpatient appropriate because: severity of illness, hypotension and HR uncontrolled, on IV therapies as outlined.          Time spent: 40 minutes  Author: Ezekiel Slocumb 03/30/2021 5:15 PM  For on call review [email or web address].

## 2021-03-30 NOTE — Assessment & Plan Note (Signed)
Pt reports long hx of near-panic episodes of anxiety related to various situations / stressors.  Not on medication for this.   --Trial PRN hydroxyzine --Discussed deep breathing techniques --Outpatient follow up for long-term mgmt

## 2021-03-30 NOTE — Care Management CC44 (Signed)
Condition Code 44 Documentation Completed  Patient Details  Name: Gabriella Nguyen MRN: [id number] Date of Birth: 1955-10-01   Condition Code 44 given:  Yes Patient signature on Condition Code 44 notice:  Yes Documentation of 2 MD's agreement:  Yes Code 44 added to claim:  Yes    Gala Lewandowsky, RN 03/30/2021, 9:05 AM

## 2021-03-30 NOTE — Care Management Obs Status (Signed)
MEDICARE OBSERVATION STATUS NOTIFICATION   Patient Details  Name: Gabriella Nguyen MRN: [id number] Date of Birth: 13-Nov-1955   Medicare Observation Status Notification Given:  Yes    Gala Lewandowsky, RN 03/30/2021, 9:05 AM

## 2021-03-30 NOTE — Plan of Care (Signed)
?  Problem: Education: ?Goal: Knowledge of General Education information will improve ?Description: Including pain rating scale, medication(s)/side effects and non-pharmacologic comfort measures ?Outcome: Progressing ?  ?Problem: Clinical Measurements: ?Goal: Ability to maintain clinical measurements within normal limits will improve ?Outcome: Progressing ?  ?Problem: Clinical Measurements: ?Goal: Respiratory complications will improve ?Outcome: Progressing ?  ?Problem: Clinical Measurements: ?Goal: Cardiovascular complication will be avoided ?Outcome: Progressing ?  ?Problem: Activity: ?Goal: Risk for activity intolerance will decrease ?Outcome: Progressing ?  ?Problem: Safety: ?Goal: Ability to remain free from injury will improve ?Outcome: Progressing ?  ?Problem: Cardiac: ?Goal: Ability to achieve and maintain adequate cardiopulmonary perfusion will improve ?Outcome: Progressing ?  ?

## 2021-03-30 NOTE — Consult Note (Addendum)
Cardiology Consultation:   Patient ID: Gabriella Nguyen: [id number]; DOB: 1955/08/04  Admit date: 03/28/2021 Date of Consult: 03/30/2021  PCP:  Gabriella Nguyen, [place] [facility] Cardiologist:  None        Patient Profile:   Gabriella Nguyen is a 66 y.o. female with a hx of lung nodule, OA, HTN, asthma, asp PNA, FH Afib, who is being seen 03/30/2021 for the evaluation of atrial fib at the request of Gabriella Nguyen.  History of Present Illness:   Gabriella Nguyen has no previous history of cardiac issues. As she came home from church on 01/01, she felt her heart racing. She went to [facility] ER. She was in Afib, RVR. HR improved w/ IV Cardizem, pt tx to [facility] >> Cards asked to see.   Gabriella Nguyen has never had anything like this happen to her before.  She was fixing breakfast on Sunday morning and all of a sudden started feeling bad.  She could feel her heart pounding in the area of her lateral upper abdomen and lower ribs.  She was weak, just a little lightheaded.  She went and sat down and told her husband to take her to the hospital.  She was in atrial fibrillation, RVR.  Her blood pressure limited treatment in the hospital, IV Cardizem helped, but her systolic blood pressure dropped into the 90s and it was discontinued.    She was started on metoprolol 25 mg twice daily and he is tolerating this, although SBP is approximately 100.  If her heart rate is less than 150, she does not seem to be aware of the atrial fibrillation.  She admits to having panic attacks, says they happen on a regular basis.  Some are very minor and she can take a couple of deep breaths and get it resolved.  Others are worse than last longer.  She is struggling to deal with the atrial fibrillation, it makes her anxious to talk about it, will be told that her heart rate is elevated.  However, now that her heart rate is 120 or so, she is completely unaware of the atrial fibrillation.  However, her father  died of some kind of heart problem when she was 3. Her son has a hx of PAF, is not aware of it when he is in it.  Her potassium was 3.4 at 1 point and has been supplemented.  Her albumin was borderline low at 3.6 on admission and has been trending down, now 2.8.  Total protein went from 6.6 down to 5.3.  BNP was normal, cardiac enzymes were negative, CRP and procalcitonin were also normal.  She is COVID-positive.   Past Medical History:  Diagnosis Date   Aspiration pneumonia ([place]) 06/2011 hosp   ASTHMA    OSTEOARTHRITIS, KNEES, BILATERAL    Unspecified essential hypertension     Past Surgical History:  Procedure Laterality Date   CESAREAN SECTION  1989   hand surgery-right Right 1990   at base of right thumb   JOINT REPLACEMENT     Right total knee  10/20/2015   done by Gabriella Nguyen at the [facility] hospital    TONSILLECTOMY     at age 62   [place] Left 07/10/2017   Procedure: LEFT TOTAL KNEE ARTHROPLASTY;  Surgeon: Gabriella Arabian, Nguyen;  Location: [facility]S;  Service: Orthopedics;  Laterality: Left;   TOTAL KNEE ARTHROPLASTY WITH REVISION COMPONENTS Right 01/04/2017   Procedure: Right knee total knee arthroplasty  revision;  Surgeon: Gabriella Gross, Nguyen;  Location: [facility];  Service: Orthopedics;  Laterality: Right;     Home Medications:  Prior to Admission medications   Medication Sig Start Date End Date Taking? Authorizing Provider  albuterol (VENTOLIN HFA) 108 (90 Base) MCG/ACT inhaler Inhale 2 puffs into the lungs every 6 (six) hours as needed for wheezing or shortness of breath. 11/20/18  Yes Gabriella Nguyen  aspirin EC 81 MG tablet Take 81 mg by mouth every morning. Swallow whole.   Yes Gabriella Nguyen  BIOTIN PO Take 1 tablet by mouth every morning.   Yes Gabriella Nguyen  carvedilol (COREG) 6.25 MG tablet Take 3.125 mg by mouth 2 (two) times daily with a meal.   Yes Gabriella Nguyen  ferrous sulfate 325 (65 FE) MG tablet Take  325 mg by mouth every Monday, Wednesday, and Friday.   Yes Gabriella Nguyen  fluticasone-salmeterol (ADVAIR) 100-50 MCG/ACT AEPB Inhale 1 puff into the lungs 2 (two) times daily.   Yes Gabriella Nguyen  losartan (COZAAR) 50 MG tablet Take 25 mg by mouth every morning.   Yes Gabriella Nguyen  Magnesium Oxide 420 MG TABS Take 420 mg by mouth every morning.   Yes Gabriella Nguyen  Multiple Vitamin (MULTIVITAMIN WITH MINERALS) TABS tablet Take 1 tablet by mouth every morning.   Yes Gabriella Nguyen  NAPROXEN PO Take 1 tablet by mouth daily as needed (pain).   Yes Gabriella Nguyen  potassium chloride SA (KLOR-CON M) 20 MEQ tablet Take 20 mEq by mouth 2 (two) times daily.   Yes Gabriella Nguyen  spironolactone-hydrochlorothiazide (ALDACTAZIDE) 25-25 MG tablet TAKE 1 TABLET EVERY DAY Patient taking differently: 1 tablet every morning. 06/06/19  Yes Gabriella Nguyen    Inpatient Medications: Scheduled Meds:  apixaban  5 mg Oral BID   feeding supplement  237 mL Oral BID BM   metoprolol tartrate  25 mg Oral BID   mometasone-formoterol  2 puff Inhalation BID   multivitamin with minerals  1 tablet Oral Daily   Continuous Infusions:  PRN Meds: acetaminophen, albuterol, hydrOXYzine, ondansetron (ZOFRAN) IV  Allergies:    Allergies  Allergen Reactions   Amlodipine Swelling    Gingival enlargement   Lisinopril Cough   Nifedipine Swelling and Other (See Comments)    Swelling gums     Social History:   Social History   Socioeconomic History   Marital status: Single    Spouse name: Not on file   Number of children: 1   Years of education: 14   Highest education level: Not on file  Occupational History   Occupation: Midwife    Employer: [employer]  Tobacco Use   Smoking status: Former    Years: 2.00    Types: Cigarettes    Start date: 08/23/1984    Quit date: 02/1985    Years since quitting: 36.1   Smokeless  tobacco: Never  Vaping Use   Vaping Use: Never used  Substance and Sexual Activity   Alcohol use: Yes    Alcohol/week: 2.0 standard drinks    Types: 2 Standard drinks or equivalent per week    Comment: beer or wine   Drug use: No   Sexual activity: Not on file  Other Topics Concern   Not on file  Social History Narrative   HSG, 2 years college. Married '85 -divorced '95. Son '89. Work - [employer]. Lives with son and SO.  Fun: Lac[place] Axon, travel ([place] ideal).    Denies any religious beliefs effecting health care.    Social Determinants of Health   Financial Resource Strain: Not on file  Food Insecurity: Not on file  Transportation Needs: Not on file  Physical Activity: Not on file  Stress: Not on file  Social Connections: Not on file  Intimate Partner Violence: Not on file    Family History:   Family History  Problem Relation Age of Onset   Dementia Mother    Hypertension Mother    Heart disease Father    COPD Father      ROS:  Please see the history of present illness.  All other ROS reviewed and negative.     Physical Exam/Data:   Vitals:   03/30/21 0845 03/30/21 0850 03/30/21 1155 03/30/21 1234  BP:  103/75 102/82 (!) 102/91  Pulse: (!) 122  (!) 115   Resp:   18   Temp:   98.3 F (36.8 C)   TempSrc:   Oral   SpO2:      Weight:      Height:       No intake or output data in the 24 hours ending 03/30/21 1548 Last 3 Weights 03/28/2021 06/21/2019 02/26/2019  Weight (lbs) 155 lb 173 lb 6.4 oz 191 lb  Weight (kg) 70.308 kg 78.654 kg 86.637 kg     Body mass index is 21.02 kg/m.  General:  Well nourished, well developed, in no acute distress HEENT: normal Neck: no JVD Vascular: No carotid bruits; Distal pulses 2+ bilaterally Cardiac:  normal S1, S2; rapid and irregular rate and rhythm; no murmur  Lungs:  clear to auscultation bilaterally, no wheezing, rhonchi or rales  Abd: soft, nontender, no hepatomegaly  Ext: no edema Musculoskeletal:  No deformities, BUE and  BLE strength normal and equal Skin: warm and dry  Neuro:  CNs 2-12 intact, no focal abnormalities noted Psych:  Anxious affect   EKG:  The EKG was personally reviewed and demonstrates:  Atrial fib, RVR, HR 153 Telemetry:  Telemetry was personally reviewed and demonstrates:  atrial fib, RVR  Relevant CV Studies:  ECHO: ordered  Laboratory Data:  High Sensitivity Troponin:   Recent Labs  Lab 03/28/21 1357 03/28/21 1520  TROPONINIHS 13 17     Chemistry Recent Labs  Lab 03/28/21 1357 03/29/21 0017 03/30/21 0341  NA 133* 132* 135  K 3.9 3.4* 3.7  CL 98 99 102  CO2 26 23 28   GLUCOSE 96 109* 112*  BUN 15 9 6*  CREATININE 0.66 0.68 0.73  CALCIUM 8.8* 8.7* 8.8*  MG  --  1.6* 1.9  GFRNONAA >60 >60 >60  ANIONGAP 9 10 5     Recent Labs  Lab 03/28/21 1357 03/29/21 0017 03/30/21 0341  PROT 6.6 5.7* 5.3*  ALBUMIN 3.6 3.1* 2.8*  AST 42* 38 44*  ALT 25 25 25   ALKPHOS 54 55 51  BILITOT 0.8 1.4* 0.8   Lipids No results for input(s): CHOL, TRIG, HDL, LABVLDL, LDLCALC, CHOLHDL in the last 168 hours.  Hematology Recent Labs  Lab 03/28/21 1357 03/29/21 0017 03/30/21 0341  WBC 7.1 6.0 4.9  RBC 4.10 3.84* 3.66*  HGB 13.7 13.3 12.8  HCT 39.7 37.5 36.8  MCV 96.8 97.7 100.5*  MCH 33.4 34.6* 35.0*  MCHC 34.5 35.5 34.8  RDW 12.3 12.5 12.5  PLT 218 212 185   Thyroid  Recent Labs  Lab 03/29/21 0017  TSH 1.156    BNP Recent Labs  Lab 03/28/21 1357  BNP 20.1    DDimer  Recent Labs  Lab 03/29/21 0017 03/30/21 0341  DDIMER 0.44 <0.27     Radiology/Studies:  DG Chest 1 View  Result Date: 03/28/2021 CLINICAL DATA:  COVID, AFib, anxiety. EXAM: CHEST  1 VIEW COMPARISON:  Chest x-ray 09/20/2011. FINDINGS: The heart size and mediastinal contours are within normal limits. Both lungs are clear. The visualized skeletal structures are unremarkable. IMPRESSION: No active disease. Electronically Signed   By: Ronney Asters M.D.   On: 03/28/2021 17:31     Assessment and  Plan:   Atrial fibrillation, RVR, persistent -Heart rate has been difficult to control as her blood pressure is too low to allow much in the way of rate lowering medications - A contributory factor to her tachycardia is her COVID infection -LFTs and TSH are essentially normal, discuss amiodarone with Nguyen -CHA2DS2-VASc is is 3, she has been started on Eliquis 5 mg daily -She was on Coreg 3.125 mg twice daily prior to admission, this was held and she was started on metoprolol 25 mg twice daily. -It honestly seems as though she is doing well enough that if we could control her heart rate, she could be discharged -When her blood pressure dropped today after getting the Cardizem, she was given a 500 cc saline bolus and her BP improved.  2.  COVID: - Her fibrinogen is 237, D-dimer has been normal, CRP and procalcitonin have also been normal.  LDH only 161. - It does not seem to be a very severe infection.  3.  Hypertension: - Her home doses of losartan 50 mg daily, spironolactone HCTZ 25-25, and Coreg 3.125 mg daily have been held - Blood pressure is low to well controlled on metoprolol 25 mg daily now that Cardizem IV has been stopped   Risk Assessment/Risk Scores:     CHA2DS2-VASc Score = 3   This indicates a 3.2% annual risk of stroke. The patient's score is based upon: CHF History: 0 HTN History: 1 Diabetes History: 0 Stroke History: 0 Vascular Disease History: 0 Age Score: 1 Gender Score: 1    For questions or updates, please contact [place] Please consult [email or web address] for contact info under    Signed, Rosaria Ferries, PA-C  03/30/2021 3:48 PM

## 2021-03-31 ENCOUNTER — Other Ambulatory Visit (HOSPITAL_COMMUNITY): Payer: Self-pay

## 2021-03-31 ENCOUNTER — Inpatient Hospital Stay (HOSPITAL_COMMUNITY): Payer: No Typology Code available for payment source

## 2021-03-31 DIAGNOSIS — I10 Essential (primary) hypertension: Secondary | ICD-10-CM

## 2021-03-31 DIAGNOSIS — Z719 Counseling, unspecified: Secondary | ICD-10-CM

## 2021-03-31 DIAGNOSIS — U071 COVID-19: Secondary | ICD-10-CM | POA: Diagnosis not present

## 2021-03-31 DIAGNOSIS — I4891 Unspecified atrial fibrillation: Secondary | ICD-10-CM | POA: Diagnosis not present

## 2021-03-31 LAB — COMPREHENSIVE METABOLIC PANEL WITH GFR
ALT: 25 U/L (ref 0–44)
AST: 37 U/L (ref 15–41)
Albumin: 3 g/dL — ABNORMAL LOW (ref 3.5–5.0)
Alkaline Phosphatase: 51 U/L (ref 38–126)
Anion gap: 7 (ref 5–15)
BUN: 5 mg/dL — ABNORMAL LOW (ref 8–23)
CO2: 27 mmol/L (ref 22–32)
Calcium: 8.9 mg/dL (ref 8.9–10.3)
Chloride: 100 mmol/L (ref 98–111)
Creatinine, Ser: 0.75 mg/dL (ref 0.44–1.00)
GFR, Estimated: >60 mL/min
Glucose, Bld: 108 mg/dL — ABNORMAL HIGH (ref 70–99)
Potassium: 3.3 mmol/L — ABNORMAL LOW (ref 3.5–5.1)
Sodium: 134 mmol/L — ABNORMAL LOW (ref 135–145)
Total Bilirubin: 0.6 mg/dL (ref 0.3–1.2)
Total Protein: 5.3 g/dL — ABNORMAL LOW (ref 6.5–8.1)

## 2021-03-31 LAB — CBC WITH DIFFERENTIAL/PLATELET
Abs Immature Granulocytes: 0.01 10*3/uL (ref 0.00–0.07)
Basophils Absolute: 0 10*3/uL (ref 0.0–0.1)
Basophils Relative: 1 %
Eosinophils Absolute: 0.2 10*3/uL (ref 0.0–0.5)
Eosinophils Relative: 3 %
HCT: 36 % (ref 36.0–46.0)
Hemoglobin: 12.3 g/dL (ref 12.0–15.0)
Immature Granulocytes: 0 %
Lymphocytes Relative: 25 %
Lymphs Abs: 1.4 10*3/uL (ref 0.7–4.0)
MCH: 34.5 pg — ABNORMAL HIGH (ref 26.0–34.0)
MCHC: 34.2 g/dL (ref 30.0–36.0)
MCV: 100.8 fL — ABNORMAL HIGH (ref 80.0–100.0)
Monocytes Absolute: 0.6 10*3/uL (ref 0.1–1.0)
Monocytes Relative: 10 %
Neutro Abs: 3.5 10*3/uL (ref 1.7–7.7)
Neutrophils Relative %: 61 %
Platelets: 183 10*3/uL (ref 150–400)
RBC: 3.57 MIL/uL — ABNORMAL LOW (ref 3.87–5.11)
RDW: 12.6 % (ref 11.5–15.5)
WBC: 5.7 10*3/uL (ref 4.0–10.5)
nRBC: 0 % (ref 0.0–0.2)

## 2021-03-31 LAB — ECHOCARDIOGRAM COMPLETE
AR max vel: 3.06 cm2
AV Peak grad: 4.9 mmHg
Ao pk vel: 1.11 m/s
Area-P 1/2: 5.06 cm2
Calc EF: 62 %
Height: 72 in
S' Lateral: 3.2 cm
Single Plane A2C EF: 62.7 %
Single Plane A4C EF: 61.1 %
Weight: 2480 [oz_av]

## 2021-03-31 LAB — D-DIMER, QUANTITATIVE: D-Dimer, Quant: 0.36 ug{FEU}/mL (ref 0.00–0.50)

## 2021-03-31 LAB — MAGNESIUM: Magnesium: 1.6 mg/dL — ABNORMAL LOW (ref 1.7–2.4)

## 2021-03-31 LAB — C-REACTIVE PROTEIN: CRP: 0.5 mg/dL

## 2021-03-31 LAB — PHOSPHORUS: Phosphorus: 3.1 mg/dL (ref 2.5–4.6)

## 2021-03-31 MED ORDER — APIXABAN 5 MG PO TABS
5.0000 mg | ORAL_TABLET | Freq: Two times a day (BID) | ORAL | 0 refills | Status: DC
  Filled 2021-03-31: qty 60, 30d supply, fill #0

## 2021-03-31 MED ORDER — METOPROLOL TARTRATE 25 MG PO TABS
25.0000 mg | ORAL_TABLET | Freq: Two times a day (BID) | ORAL | 0 refills | Status: AC
  Filled 2021-03-31: qty 60, 30d supply, fill #0

## 2021-03-31 MED ORDER — AMIODARONE HCL 200 MG PO TABS
200.0000 mg | ORAL_TABLET | Freq: Every day | ORAL | Status: DC
  Administered 2021-03-31: 200 mg via ORAL
  Filled 2021-03-31: qty 1

## 2021-03-31 MED ORDER — AMIODARONE HCL 200 MG PO TABS
ORAL_TABLET | ORAL | 0 refills | Status: DC
  Filled 2021-03-31: qty 45, 30d supply, fill #0

## 2021-03-31 MED ORDER — POTASSIUM CHLORIDE CRYS ER 20 MEQ PO TBCR
40.0000 meq | EXTENDED_RELEASE_TABLET | Freq: Once | ORAL | Status: AC
  Administered 2021-03-31: 40 meq via ORAL
  Filled 2021-03-31: qty 2

## 2021-03-31 MED ORDER — MAGNESIUM SULFATE 2 GM/50ML IV SOLN
2.0000 g | Freq: Once | INTRAVENOUS | Status: AC
Start: 2021-03-31 — End: 2021-03-31
  Administered 2021-03-31: 2 g via INTRAVENOUS
  Filled 2021-03-31: qty 50

## 2021-03-31 NOTE — Progress Notes (Signed)
Progress Note  Patient Name: Gabriella Nguyen Date of Encounter: 03/31/2021  [facility] Cardiologist: Jodelle Red, MD   Subjective   Converted to sinus rhythm around 6:30am. She is feeling much better. Spent extensive time today in education at bedside regarding treatment options, diet, exercise, and other lifestyle recommendations.  Inpatient Medications    Scheduled Meds:  amiodarone  200 mg Oral Daily   apixaban  5 mg Oral BID   feeding supplement  237 mL Oral BID BM   metoprolol tartrate  25 mg Oral BID   mometasone-formoterol  2 puff Inhalation BID   multivitamin with minerals  1 tablet Oral Daily   Continuous Infusions:  amiodarone 30 mg/hr (03/30/21 2336)   PRN Meds: acetaminophen, albuterol, hydrOXYzine   Vital Signs    Vitals:   03/31/21 0237 03/31/21 0337 03/31/21 0804 03/31/21 0837  BP: 114/80 99/74 91/66  103/79  Pulse:  100 93 86  Resp:  18 18   Temp:  97.7 F (36.5 C) 97.8 F (36.6 C)   TempSrc:  Oral Oral   SpO2:  98%    Weight:      Height:        Intake/Output Summary (Last 24 hours) at 03/31/2021 1050 Last data filed at 03/31/2021 0600 Gross per 24 hour  Intake 408.75 ml  Output --  Net 408.75 ml   Last 3 Weights 03/28/2021 06/21/2019 02/26/2019  Weight (lbs) 155 lb 173 lb 6.4 oz 191 lb  Weight (kg) 70.308 kg 78.654 kg 86.637 kg      Telemetry    Sinus rhythm at controlled rate - Personally Reviewed  ECG    N/A  Physical Exam   GEN: No acute distress.   Neck: No JVD Cardiac: RRR, no murmurs, rubs, or gallops.  Respiratory: Clear to auscultation bilaterally. GI: Soft, nontender, non-distended  MS: No edema; No deformity. Neuro:  Nonfocal  Psych: Normal affect   Labs    High Sensitivity Troponin:   Recent Labs  Lab 03/28/21 1357 03/28/21 1520  TROPONINIHS 13 17     Chemistry Recent Labs  Lab 03/29/21 0017 03/30/21 0341 03/31/21 0223  NA 132* 135 134*  K 3.4* 3.7 3.3*  CL 99 102 100  CO2 23 28 27    GLUCOSE 109* 112* 108*  BUN 9 6* 5*  CREATININE 0.68 0.73 0.75  CALCIUM 8.7* 8.8* 8.9  MG 1.6* 1.9 1.6*  PROT 5.7* 5.3* 5.3*  ALBUMIN 3.1* 2.8* 3.0*  AST 38 44* 37  ALT 25 25 25   ALKPHOS 55 51 51  BILITOT 1.4* 0.8 0.6  GFRNONAA >60 >60 >60  ANIONGAP 10 5 7     Hematology Recent Labs  Lab 03/29/21 0017 03/30/21 0341 03/31/21 0223  WBC 6.0 4.9 5.7  RBC 3.84* 3.66* 3.57*  HGB 13.3 12.8 12.3  HCT 37.5 36.8 36.0  MCV 97.7 100.5* 100.8*  MCH 34.6* 35.0* 34.5*  MCHC 35.5 34.8 34.2  RDW 12.5 12.5 12.6  PLT 212 185 183   Thyroid  Recent Labs  Lab 03/29/21 0017  TSH 1.156    BNP Recent Labs  Lab 03/28/21 1357  BNP 20.1    DDimer  Recent Labs  Lab 03/29/21 0017 03/30/21 0341 03/31/21 0223  DDIMER 0.44 <0.27 0.36     Radiology    No results found.  Cardiac Studies   Pending echo   Patient Profile     66 y.o. female with a hx of lung nodule, OA, HTN, asthma, asp PNA, FH Afib  seen for afiv RVR.   Assessment & Plan    New onset Atrial fibrillation with RVR - Insetting of COVID 19 - IV cardizem stopped due to hypotension - Converted to sinus rhythm around 6:30am this morning on IV amiodarone >> will change to po amiodarone at 200 mg bid for 2 weeks, then 200 mg daily. We discussed alternative treatment options, but as amiodarone converted her, she would like to continue. Would treat for 90 days, then re-evaluate - Continue lopressor 25mg  BID (BP stable) - Continue Eliquis 5mg  BID for anticoagulation - Pending echo  - TSH normal  -extensive education done at the bedside re: afib, diet recommendations, exercise, alcohol, caffeine  2. HTN - BP stable this morning at 103/79 on BB  3. COVID 19 - Per primary team  4. Hypokalemia 5. Hypomagnesemia - Supplemented - Keep K > 4 & Mg > 2   [facility] will sign off.   Medication Recommendations:   Apixaban 5 mg BID Amiodarone 200 mg BID for 14 days, then 200 mg daily Metoprolol tartrate 25 mg  BID Other recommendations (labs, testing, etc):  pending echo Follow up as an outpatient:  We will arrange for her to see me in outpatient follow up   Time Spent with Patient: I have spent a total of 60 with patient reviewing hospital notes, telemetry, EKGs, labs and examining the patient as well as establishing an assessment and plan that was discussed with the patient.  > 50% of time was spent in direct patient care.   For questions or updates, please contact [facility] Please consult [email or web address] for contact info under        Signed, , MD  03/31/2021, 10:50 AM

## 2021-03-31 NOTE — Discharge Summary (Signed)
Physician Discharge Summary  Gabriella Nguyen [id number] DOB: 1955/10/04 DOA: 03/28/2021  PCP: Marrian Salvage, FNP  Admit date: 03/28/2021 Discharge date: 03/31/2021  Admitted From: Home Discharge disposition: Home   Code Status: Prior full code  Discharge Diagnosis:   Principal Problem:   Atrial fibrillation with RVR ([place]) Active Problems:   Essential hypertension   Asthma   Hypokalemia   COVID-19 virus detected   Malnutrition of moderate degree   Anxiety    Chief Complaint  Patient presents with   Palpitations   Anxiety   Brief narrative: Gabriella Nguyen is a 66 y.o. female with PMH significant for asthma and hypertension. Patient presented at [facility] ED on 03/28/2021 for evaluation of rapid palpitations and lightheadedness, no syncope.  Recently treated with Augmentin for respiratory infection last month which she feels has completely resolved.  She had a CT of her chest, abdomen, and pelvis last month prior to the antibiotic that was notable for moderate coronary calcifications and nodular tree-in-bud opacities in the right upper lobe with debris in the right mainstem bronchus suggestive of aspiration or infection.     In the ED, vitals normal except for heart rate in the 140s to 160s with EKG showing A-fib RVR with rate of 143 bpm.  Chest x-ray negative.   Labs notable for mild hyponatremia, CBC unremarkable.   COVID PCR was positive incidentally.   Patient was started on Cardizem infusion after bolus, IV heparin.   [facility] to [facility] service.  Subjective: Patient was seen and examined this morning.  Pleasant elderly African-American female.  Lying on bed.  Not in distress.  No new symptoms.  Sister at bedside. Chart reviewed In last 24 hours, patient is afebrile, heart rate improving, down to 80s this morning, blood pressure in 90s and low 100s, breathing on room air Labs this morning with sodium 134, potassium low at 3.3,  magnesium low at 1.6.  Assessment/Plan: New onset atrial fibrillation with RVR -Presented with rapid palpitations, lightheadedness and EKG with A-fib RVR with rate in 140s-160s, in the setting of COVID-19 infection. -Initially started on IV Cardizem drip in the ED which was later switched to IV amiodarone because of hypotension.  Converted to normal sinus rhythm this morning.  Cardio consult appreciated.  She has been started on amiodarone 200 mg daily for 2 weeks then 200 mg daily. -Also on Lopressor 25 mg twice daily. -Anticoagulated with Eliquis 5 mg twice daily. -Echocardiogram with EF 60 to 65%, no LV wall motion abnormality. -To follow-up with cardiology as an outpatient.  Essential hypertension -Prior to admission, patient was on Coreg, losartan, Aldactazide.  With IV Cardizem initiation in the ED, patient became hypotensive.   -Currently patient is on metoprolol 25 mg twice daily.  Losartan and Aldactazide on hold.   -Continue to follow-up with cardiology as an outpatient.  COVID-19 infection -Reportedly had mild cough in past few weeks.  No other symptoms.  Respiratory status stable.  Incidental positive -Not getting any COVID specific treatment.  Continue supportive care -Isolation.   Malnutrition of moderate degree -related to acute illness (COVID+) as evidenced by moderate fat depletion, severe muscle depletion. -Appreciate dietitian input -Started on Ensure drinks, MVI -Encourage PO intake  Hypokalemia/hypomagnesemia -Potassium level low at 3.3 magnesium level low at 1.6 this morning.  IV and oral replacement given. Recent Labs  Lab 03/28/21 1357 03/29/21 0017 03/30/21 0341 03/31/21 0223  K 3.9 3.4* 3.7 3.3*  MG  --  1.6* 1.9 1.6*  PHOS  --  2.9 3.1 3.1   Hyponatremia -Mild hyponatremia between 130 and 135. Recent Labs  Lab 03/28/21 1357 03/29/21 0017 03/30/21 0341 03/31/21 0223  NA 133* 132* 135 134*   Asthma -Continue ICS/LABA and PRN SABA    Mobility: Independent Living condition: Lives at home with son Goals of care:   Code Status: Prior  Nutritional status: Body mass index is 21.02 kg/m.  Nutrition Problem: Moderate Malnutrition Etiology: acute illness (COVID+) Signs/Symptoms: moderate fat depletion, severe muscle depletion  Discharge Medications:   Allergies as of 03/31/2021       Reactions   Amlodipine Swelling   Gingival enlargement   Lisinopril Cough   Nifedipine Swelling, Other (See Comments)   Swelling gums         Medication List     STOP taking these medications    aspirin EC 81 MG tablet   carvedilol 6.25 MG tablet Commonly known as: COREG   losartan 50 MG tablet Commonly known as: COZAAR   NAPROXEN PO   potassium chloride SA 20 MEQ tablet Commonly known as: KLOR-CON M   spironolactone-hydrochlorothiazide 25-25 MG tablet Commonly known as: ALDACTAZIDE       TAKE these medications    albuterol 108 (90 Base) MCG/ACT inhaler Commonly known as: VENTOLIN HFA Inhale 2 puffs into the lungs every 6 (six) hours as needed for wheezing or shortness of breath.   amiodarone 200 MG tablet Commonly known as: PACERONE Take 1 tablet (200 mg) by mouth twice daily for 14 days, followed by 1 tablet (200 mg) once daily daily.   BIOTIN PO Take 1 tablet by mouth every morning.   Eliquis 5 MG Tabs tablet Generic drug: apixaban Take 1 tablet (5 mg total) by mouth 2 (two) times daily.   ferrous sulfate 325 (65 FE) MG tablet Take 325 mg by mouth every Monday, Wednesday, and Friday.   fluticasone-salmeterol 100-50 MCG/ACT Aepb Commonly known as: ADVAIR Inhale 1 puff into the lungs 2 (two) times daily.   Magnesium Oxide 420 MG Tabs Take 420 mg by mouth every morning.   metoprolol tartrate 25 MG tablet Commonly known as: LOPRESSOR Take 1 tablet (25 mg total) by mouth 2 (two) times daily.   multivitamin with minerals Tabs tablet Take 1 tablet by mouth every morning.        Wound care:      Discharge Instructions:   Discharge Instructions     Amb referral to AFIB Clinic   Complete by: As directed    Call MD for:  difficulty breathing, headache or visual disturbances   Complete by: As directed    Call MD for:  extreme fatigue   Complete by: As directed    Call MD for:  hives   Complete by: As directed    Call MD for:  persistant dizziness or light-headedness   Complete by: As directed    Call MD for:  persistant nausea and vomiting   Complete by: As directed    Call MD for:  severe uncontrolled pain   Complete by: As directed    Call MD for:  temperature >100.4   Complete by: As directed    Diet general   Complete by: As directed    Discharge instructions   Complete by: As directed    General discharge instructions:  Follow with Primary MD Marrian Salvage, FNP in 7 days   Get CBC/BMP checked in next visit within 1 week by PCP or  SNF MD. (We routinely change or add medications that can affect your baseline labs and fluid status, therefore we recommend that you get the mentioned basic workup next visit with your PCP, your PCP may decide not to get them or add new tests based on their clinical decision)  On your next visit with your PCP, please get your medicines reviewed and adjusted.  Please request your PCP  to go over all hospital tests, procedures, radiology results at the follow up, please get all Hospital records sent to your PCP by signing hospital release before you go home.  Activity: As tolerated with Full fall precautions use walker/cane & assistance as needed  Avoid using any recreational substances like cigarette, tobacco, alcohol, or non-prescribed drug.  If you experience worsening of your admission symptoms, develop shortness of breath, life threatening emergency, suicidal or homicidal thoughts you must seek medical attention immediately by calling 911 or calling your MD immediately  if symptoms less severe.  You must read complete  instructions/literature along with all the possible adverse reactions/side effects for all the medicines you take and that have been prescribed to you. Take any new medicine only after you have completely understood and accepted all the possible adverse reactions/side effects.   Do not drive, operate heavy machinery, perform activities at heights, swimming or participation in water activities or provide baby sitting services if your were admitted for syncope or siezures until you have seen by Primary MD or a Neurologist and advised to do so again.  Do not drive when taking Pain medications.  Do not take more than prescribed Pain, Sleep and Anxiety Medications  Wear Seat belts while driving.  Please note You were cared for by a hospitalist during your hospital stay. If you have any questions about your discharge medications or the care you received while you were in the hospital after you are discharged, you can call the unit and asked to speak with the hospitalist on call if the hospitalist that took care of you is not available. Once you are discharged, your primary care physician will handle any further medical issues. Please note that NO REFILLS for any discharge medications will be authorized once you are discharged, as it is imperative that you return to your primary care physician (or establish a relationship with a primary care physician if you do not have one) for your aftercare needs so that they can reassess your need for medications and monitor your lab values.   Increase activity slowly   Complete by: As directed        Follow ups:    Follow-up Information     Buford Dresser, MD. Go on 04/27/2021.   Specialty: Cardiology Why: 04/27/21 at 8:20 AM, second floor of [facility] information: [place] [address] [phone]         Marrian Salvage, [place] Follow up.   Specialty: Internal Medicine Contact  information: [place] [phone]                 Discharge Exam:   Vitals:   03/31/21 0337 03/31/21 0804 03/31/21 0837 03/31/21 1156  BP: 99/74 91/66 103/79 101/74  Pulse: 100 93 86 80  Resp: 18 18  18   Temp: 97.7 F (36.5 C) 97.8 F (36.6 C)  98 F (36.7 C)  TempSrc: Oral Oral  Oral  SpO2: 98%     Weight:      Height:  Body mass index is 21.02 kg/m.  General exam: Pleasant, elderly African-American female.  Not in visible distress Skin: No rashes, lesions or ulcers. HEENT: Atraumatic, normocephalic, no obvious bleeding Lungs: Clear to auscultation bilaterally CVS: Regular rate and rhythm, no murmur GI/Abd soft, nontender, nondistended, bowel sound present CNS: Alert, awake, oriented x3 Psychiatry: Mood appropriate Extremities: No pedal edema, no calf numbness  Time coordinating discharge: 35 minutes   The results of significant diagnostics from this hospitalization (including imaging, microbiology, ancillary and laboratory) are listed below for reference.    Procedures and Diagnostic Studies:   DG Chest 1 View  Result Date: 03/28/2021 CLINICAL DATA:  COVID, AFib, anxiety. EXAM: CHEST  1 VIEW COMPARISON:  Chest x-ray 09/20/2011. FINDINGS: The heart size and mediastinal contours are within normal limits. Both lungs are clear. The visualized skeletal structures are unremarkable. IMPRESSION: No active disease. Electronically Signed   By: Ronney Asters M.D.   On: 03/28/2021 17:31     Labs:   Basic Metabolic Panel: Recent Labs  Lab 03/28/21 1357 03/29/21 0017 03/30/21 0341 03/31/21 0223  NA 133* 132* 135 134*  K 3.9 3.4* 3.7 3.3*  CL 98 99 102 100  CO2 26 23 28 27   GLUCOSE 96 109* 112* 108*  BUN 15 9 6* 5*  CREATININE 0.66 0.68 0.73 0.75  CALCIUM 8.8* 8.7* 8.8* 8.9  MG  --  1.6* 1.9 1.6*  PHOS  --  2.9 3.1 3.1   GFR Estimated Creatinine Clearance: 76.8 mL/min (by C-G formula based on SCr of 0.75  mg/dL). Liver Function Tests: Recent Labs  Lab 03/28/21 1357 03/29/21 0017 03/30/21 0341 03/31/21 0223  AST 42* 38 44* 37  ALT 25 25 25 25   ALKPHOS 54 55 51 51  BILITOT 0.8 1.4* 0.8 0.6  PROT 6.6 5.7* 5.3* 5.3*  ALBUMIN 3.6 3.1* 2.8* 3.0*   Recent Labs  Lab 03/28/21 1357  LIPASE 25   No results for input(s): AMMONIA in the last 168 hours. Coagulation profile Recent Labs  Lab 03/28/21 1357  INR 1.0    CBC: Recent Labs  Lab 03/28/21 1357 03/29/21 0017 03/30/21 0341 03/31/21 0223  WBC 7.1 6.0 4.9 5.7  NEUTROABS 4.8 3.5 2.7 3.5  HGB 13.7 13.3 12.8 12.3  HCT 39.7 37.5 36.8 36.0  MCV 96.8 97.7 100.5* 100.8*  PLT 218 212 185 183   Cardiac Enzymes: No results for input(s): CKTOTAL, CKMB, CKMBINDEX, TROPONINI in the last 168 hours. BNP: Invalid input(s): POCBNP CBG: No results for input(s): GLUCAP in the last 168 hours. D-Dimer Recent Labs    03/30/21 0341 03/31/21 0223  DDIMER <0.27 0.36   Hgb A1c No results for input(s): HGBA1C in the last 72 hours. Lipid Profile No results for input(s): CHOL, HDL, LDLCALC, TRIG, CHOLHDL, LDLDIRECT in the last 72 hours. Thyroid function studies No results for input(s): TSH, T4TOTAL, T3FREE, THYROIDAB in the last 72 hours.  Invalid input(s): FREET3 Anemia work up No results for input(s): VITAMINB12, FOLATE, FERRITIN, TIBC, IRON, RETICCTPCT in the last 72 hours. Microbiology Recent Results (from the past 240 hour(s))  Resp Panel by RT-PCR (Flu A&B, Covid) Nasopharyngeal Swab     Status: Abnormal   Collection Time: 03/28/21  1:37 PM   Specimen: Nasopharyngeal Swab; Nasopharyngeal(NP) swabs in vial transport medium  Result Value Ref Range Status   SARS Coronavirus 2 by RT PCR POSITIVE (A) NEGATIVE Final    Comment: (NOTE) SARS-CoV-2 target nucleic acids are DETECTED.  The SARS-CoV-2 RNA is generally detectable in upper  respiratory specimens during the acute phase of infection. Positive results are indicative of the  presence of the identified virus, but do not rule out bacterial infection or co-infection with other pathogens not detected by the test. Clinical correlation with patient history and other diagnostic information is necessary to determine patient infection status. The expected result is Negative.  Fact Sheet for Patients: EntrepreneurPulse.com.au  Fact Sheet for Healthcare Providers: IncredibleEmployment.be  This test is not yet approved or cleared by the [place] FDA and  has been authorized for detection and/or diagnosis of SARS-CoV-2 by FDA under an Emergency Use Authorization (EUA).  This EUA will remain in effect (meaning this test can be used) for the duration of  the COVID-19 declaration under Section 564(b)(1) of the A ct, 21 U.S.C. section 360bbb-3(b)(1), unless the authorization is terminated or revoked sooner.     Influenza A by PCR NEGATIVE NEGATIVE Final   Influenza B by PCR NEGATIVE NEGATIVE Final    Comment: (NOTE) The Xpert Xpress SARS-CoV-2/FLU/RSV plus assay is intended as an aid in the diagnosis of influenza from Nasopharyngeal swab specimens and should not be used as a sole basis for treatment. Nasal washings and aspirates are unacceptable for Xpert Xpress SARS-CoV-2/FLU/RSV testing.  Fact Sheet for Patients: EntrepreneurPulse.com.au  Fact Sheet for Healthcare Providers: IncredibleEmployment.be  This test is not yet approved or cleared by the [place] FDA and has been authorized for detection and/or diagnosis of SARS-CoV-2 by FDA under an Emergency Use Authorization (EUA). This EUA will remain in effect (meaning this test can be used) for the duration of the COVID-19 declaration under Section 564(b)(1) of the Act, 21 U.S.C. section 360bbb-3(b)(1), unless the authorization is terminated or revoked.  Performed at [facility], [address] [place], [place] [address]      Signed: Terrilee Croak  [facility] 04/01/2021, 8:05 AM

## 2021-03-31 NOTE — Progress Notes (Signed)
TRH night cross cover note:  I was notified by RN of results of AM labs that are notable for serum potassium level of 3.3 as well as serum magnesium level of 1.6 in this patient who is here with A. fib RVR.  I confirmed with RN that the patient is currently able to take p.o.  Subsequently, I have placed orders for potassium chloride 40 mEq p.o. x1 dose now as well as magnesium sulfate 2 g over 2 hours x 1 dose now.    Newton Pigg, DO Hospitalist

## 2021-03-31 NOTE — Plan of Care (Signed)

## 2021-03-31 NOTE — TOC Progression Note (Signed)
Transition of Care [facility]) - Progression Note    Patient Details  Name: ELIZZIE FLAHERTY MRN: [id number] Date of Birth: [birth year]  Transition of Care [facility]) CM/SW [place], RN Phone Number:[phone]  03/31/2021, 11:12 AM  Clinical Narrative:    Benefits check requested for patient discharging on Eliquis.        Expected Discharge Plan and Services                                                 Social Determinants of Health (SDOH) Interventions    Readmission Risk Interventions No flowsheet data found.

## 2021-03-31 NOTE — Care Management (Signed)
03-31-21 1259 Case Manager spoke with patient and the provider regarding medications. Patient would like new Rx's to be sent to the [facility]. She has not had any Rx's filled at the [facility] yet. Patient will speak with the [facility] post hospitalization. No further needs identified at this time.

## 2021-04-01 ENCOUNTER — Telehealth: Payer: Self-pay

## 2021-04-01 NOTE — Telephone Encounter (Signed)
Transition Care Management Unsuccessful Follow-up Telephone Call  Date of discharge and from where:  03/31/2021-[place]  Attempts:  1st Attempt  Reason for unsuccessful TCM follow-up call:  Left voice message

## 2021-04-02 NOTE — Telephone Encounter (Signed)
Transition Care Management Follow-up Telephone Call Date of discharge and from where: 03/31/2021-[place] How have you been since you were released from the hospital? Doing Good Any questions or concerns? No  Items Reviewed: Did the pt receive and understand the discharge instructions provided? Yes  Medications obtained and verified? Yes  Other? Yes  Any new allergies since your discharge? No  Dietary orders reviewed? Yes Do you have support at home? Yes   Home Care and Equipment/Supplies: Were home health services ordered? no If so, what is the name of the agency? N/a  Has the agency set up a time to come to the patient's home? not applicable Were any new equipment or medical supplies ordered?  No What is the name of the medical supply agency? N/a Were you able to get the supplies/equipment? not applicable Do you have any questions related to the use of the equipment or supplies? N/a  Functional Questionnaire: (I = Independent and D = Dependent) ADLs: I  Bathing/Dressing- I  Meal Prep- I  Eating- I  Maintaining continence- I  Transferring/Ambulation- I  Managing Meds- I  Follow up appointments reviewed:  PCP Hospital f/u appt confirmed? Yes  Scheduled to see laura Murray on 04/08/2021 @ 9:20. [facility] f/u appt confirmed? Yes  Scheduled to see Dr. Harrell Gave on 04/27/2021 @ 8:20. Are transportation arrangements needed? No  If their condition worsens, is the pt aware to call PCP or go to the Emergency Dept.? Yes Was the patient provided with contact information for the PCP's office or ED? Yes Was to pt encouraged to call back with questions or concerns? Yes

## 2021-04-02 NOTE — Telephone Encounter (Signed)
Transition Care Management Unsuccessful Follow-up Telephone Call  Date of discharge and from where:  03/31/2021-[place]  Attempts:  2nd Attempt  Reason for unsuccessful TCM follow-up call:  Left voice message

## 2021-04-09 ENCOUNTER — Inpatient Hospital Stay: Payer: No Typology Code available for payment source | Admitting: Family

## 2021-04-09 ENCOUNTER — Telehealth (HOSPITAL_COMMUNITY): Payer: Self-pay

## 2021-04-09 ENCOUNTER — Other Ambulatory Visit (HOSPITAL_COMMUNITY): Payer: Self-pay

## 2021-04-09 NOTE — Telephone Encounter (Signed)
Pharmacy Transitions of Care Follow-up Telephone Call  Date of discharge: 03/31/21  Discharge Diagnosis: Afib  How have you been since you were released from the hospital? Patient has been doing well since discharge. Has been out of town so she got confused on when her appointments were scheduled and missed a follow up. Went over all her appointments with her and where they were. No questions about meds at this time.   Medication changes made at discharge:     START taking: amiodarone (PACERONE)  Eliquis (apixaban)  metoprolol tartrate (LOPRESSOR)  STOP taking: aspirin EC 81 MG tablet  carvedilol 6.25 MG tablet (COREG)  losartan 50 MG tablet (COZAAR)  NAPROXEN PO  potassium chloride SA 20 MEQ tablet (KLOR-CON M)  spironolactone-hydrochlorothiazide 25-25 MG tablet (ALDACTAZIDE)   Medication changes verified by the patient? Yes    Medication Accessibility:  Home Pharmacy: Not discussed   Was the patient provided with refills on discharged medications? No   Have all prescriptions been transferred from [facility] to home pharmacy? N/A   Is the patient able to afford medications? Has insurance    Medication Review:  APIXABAN (ELIQUIS)  Apixaban 5 mg BID initiated on 03/31/21.   - Discussed importance of taking medication around the same time everyday  - Advised patient of medications to avoid (NSAIDs, ASA)  - Educated that Tylenol (acetaminophen) will be the preferred analgesic to prevent risk of bleeding  - Emphasized importance of monitoring for signs and symptoms of bleeding (abnormal bruising, prolonged bleeding, nose bleeds, bleeding from gums, discolored urine, black tarry stools)  - Advised patient to alert all providers of anticoagulation therapy prior to starting a new medication or having a procedure   Follow-up Appointments:  PCP Hospital f/u appt confirmed?  Patient had f/u with Dr. Valere Dross but missed appointment but did not mean to. Will call MD office today.   [facility] f/u appt confirmed?  Scheduled to see Dr. Harrell Gave on 04/27/21 @ 8:20am.   If their condition worsens, is the pt aware to call PCP or go to the Emergency Dept.? Yes  Final Patient Assessment: Patient has f/u scheduled and knows to get refills at f/u

## 2021-04-27 ENCOUNTER — Other Ambulatory Visit: Payer: Self-pay

## 2021-04-27 ENCOUNTER — Ambulatory Visit (INDEPENDENT_AMBULATORY_CARE_PROVIDER_SITE_OTHER): Payer: Medicare PPO | Admitting: Cardiology

## 2021-04-27 ENCOUNTER — Encounter (HOSPITAL_BASED_OUTPATIENT_CLINIC_OR_DEPARTMENT_OTHER): Payer: Self-pay | Admitting: Cardiology

## 2021-04-27 VITALS — BP 140/84 | HR 65 | Ht 72.0 in | Wt 165.7 lb

## 2021-04-27 DIAGNOSIS — Z09 Encounter for follow-up examination after completed treatment for conditions other than malignant neoplasm: Secondary | ICD-10-CM | POA: Diagnosis not present

## 2021-04-27 DIAGNOSIS — Z7189 Other specified counseling: Secondary | ICD-10-CM

## 2021-04-27 DIAGNOSIS — I10 Essential (primary) hypertension: Secondary | ICD-10-CM | POA: Diagnosis not present

## 2021-04-27 DIAGNOSIS — I480 Paroxysmal atrial fibrillation: Secondary | ICD-10-CM | POA: Diagnosis not present

## 2021-04-27 DIAGNOSIS — Z7901 Long term (current) use of anticoagulants: Secondary | ICD-10-CM | POA: Diagnosis not present

## 2021-04-27 DIAGNOSIS — R600 Localized edema: Secondary | ICD-10-CM

## 2021-04-27 DIAGNOSIS — D6869 Other thrombophilia: Secondary | ICD-10-CM | POA: Diagnosis not present

## 2021-04-27 MED ORDER — SPIRONOLACTONE-HCTZ 25-25 MG PO TABS: 1.0000 | ORAL_TABLET | Freq: Every day | ORAL | 3 refills | Status: AC

## 2021-04-27 MED ORDER — APIXABAN 5 MG PO TABS: 5.0000 mg | ORAL_TABLET | Freq: Two times a day (BID) | ORAL | 3 refills | Status: AC

## 2021-04-27 NOTE — Progress Notes (Signed)
Cardiology Office Note:    Date:  04/27/2021   ID:  Gabriella, Nguyen October 05, 1955, MRN [id number]  PCP:  Reeves Dam, MD  Cardiologist:  Buford Dresser, MD  Referring MD: Marrian Salvage,*   CC: post hospital follow up  History of Present Illness:    Gabriella Nguyen is a 66 y.o. female with a hx of hypertension who is seen for follow up today. I met her during her hospitalization 03/2021 for atrial fibrillation with RVR.  She was discharged on 03/31/21. Hospital course and medication reconciliation done today. Since discharge, has overall been doing well. She notes that her legs have been swelling more recently, went to the [place] a few days ago and was put on lasix and potassium for three days. She is now off of this.  Has home BP cuff but hasn't checked since being home.  Hasn't felt palpitations since discharge.   Denies chest pain, shortness of breath at rest or with normal exertion. No PND, orthopnea, LE edema or unexpected weight gain. No syncope or palpitations.   Past Medical History:  Diagnosis Date   Aspiration pneumonia ([place]) 06/2011 hosp   ASTHMA    OSTEOARTHRITIS, KNEES, BILATERAL    Unspecified essential hypertension     Past Surgical History:  Procedure Laterality Date   CESAREAN SECTION  1989   hand surgery-right Right 1990   at base of right thumb   JOINT REPLACEMENT     Right total knee  10/20/2015   done by Dr Eilene Ghazi at the [facility] hospital    TONSILLECTOMY     at age 41   [place] Left 07/10/2017   Procedure: LEFT TOTAL KNEE ARTHROPLASTY;  Surgeon: Gaynelle Arabian, MD;  Location: [facility];  Service: Orthopedics;  Laterality: Left;   TOTAL KNEE ARTHROPLASTY WITH REVISION COMPONENTS Right 01/04/2017   Procedure: Right knee total knee arthroplasty revision;  Surgeon: Gaynelle Arabian, MD;  Location: [facility];  Service: Orthopedics;  Laterality: Right;    Current Medications: Current Outpatient Medications on File Prior to  Visit  Medication Sig   albuterol (VENTOLIN HFA) 108 (90 Base) MCG/ACT inhaler Inhale 2 puffs into the lungs every 6 (six) hours as needed for wheezing or shortness of breath.   amiodarone (PACERONE) 200 MG tablet Take 1 tablet (200 mg) by mouth twice daily for 14 days, followed by 1 tablet (200 mg) once daily daily.   BIOTIN PO Take 1 tablet by mouth every morning.   ferrous sulfate 325 (65 FE) MG tablet Take 325 mg by mouth every Monday, Wednesday, and Friday.   fluticasone-salmeterol (ADVAIR) 100-50 MCG/ACT AEPB Inhale 1 puff into the lungs 2 (two) times daily.   Magnesium Oxide 420 MG TABS Take 420 mg by mouth every morning.   metoprolol tartrate (LOPRESSOR) 25 MG tablet Take 1 tablet (25 mg total) by mouth 2 (two) times daily.   Multiple Vitamin (MULTIVITAMIN WITH MINERALS) TABS tablet Take 1 tablet by mouth every morning.   No current facility-administered medications on file prior to visit.     Allergies:   Amlodipine, Lisinopril, and Nifedipine   Social History   Tobacco Use   Smoking status: Former    Years: 2.00    Types: Cigarettes    Start date: 08/23/1984    Quit date: 02/1985    Years since quitting: 36.1   Smokeless tobacco: Never  Vaping Use   Vaping Use: Never used  Substance Use Topics   Alcohol use: Yes  Alcohol/week: 2.0 standard drinks    Types: 2 Standard drinks or equivalent per week    Comment: beer or wine   Drug use: No    Family History: family history includes COPD in her father; Dementia in her mother; Heart disease in her father; Hypertension in her mother.  ROS:   Please see the history of present illness.  Additional pertinent ROS otherwise unremarkable.  EKGs/Labs/Other Studies Reviewed:    The following studies were reviewed today: Echo 03/31/21 1. Left ventricular ejection fraction, by estimation, is 60 to 65%. The  left ventricle has normal function. The left ventricle has no regional  wall motion abnormalities. Left ventricular  diastolic parameters were  normal.   2. Right ventricular systolic function is normal. The right ventricular  size is normal.   3. The mitral valve is normal in structure. Mild mitral valve  regurgitation.   4. The aortic valve is tricuspid. There is mild calcification of the  aortic valve. There is mild thickening of the aortic valve. Aortic valve  regurgitation is not visualized.   5. The inferior vena cava is normal in size with greater than 50%  respiratory variability, suggesting right atrial pressure of 3 mmHg.   Comparison(s): No prior Echocardiogram.   EKG:  EKG is personally reviewed.   04/27/21 NSR at 65 bpm  Recent Labs: 03/28/2021: B Natriuretic Peptide 20.1 03/29/2021: TSH 1.156 03/31/2021: ALT 25; BUN 5; Creatinine, Ser 0.75; Hemoglobin 12.3; Magnesium 1.6; Platelets 183; Potassium 3.3; Sodium 134  Recent Lipid Panel No results found for: CHOL, TRIG, HDL, CHOLHDL, VLDL, LDLCALC, LDLDIRECT  Physical Exam:    VS:  BP 140/84    Pulse 65    Ht 6' (1.829 m)    Wt 165 lb 11.2 oz (75.2 kg)    SpO2 95%    BMI 22.47 kg/m     Wt Readings from Last 3 Encounters:  04/27/21 165 lb 11.2 oz (75.2 kg)  03/28/21 155 lb (70.3 kg)  06/21/19 173 lb 6.4 oz (78.7 kg)    GEN: Well nourished, well developed in no acute distress HEENT: Normal, moist mucous membranes NECK: No JVD CARDIAC: regular rhythm, normal S1 and S2, no rubs or gallops. No murmur. VASCULAR: Radial and DP pulses 2+ bilaterally. No carotid bruits RESPIRATORY:  Clear to auscultation without rales, wheezing or rhonchi  ABDOMEN: Soft, non-tender, non-distended MUSCULOSKELETAL:  Ambulates independently SKIN: Warm and dry, bilateral mild nonpitting LE edema NEUROLOGIC:  Alert and oriented x 3. No focal neuro deficits noted. PSYCHIATRIC:  Normal affect    ASSESSMENT:    1. Hospital discharge follow-up   2. Paroxysmal atrial fibrillation (HCC)   3. Essential hypertension   4. Bilateral leg edema   5. Cardiac risk  counseling   6. Counseling on health promotion and disease prevention   7. Long term current use of anticoagulant   8. Secondary hypercoagulable state ([place])    PLAN:    Atrial fibrillation, new onset 03/28/21, with rapid ventricular response--recently hospitalization -did not respond initially to metoprolol, BP dropped with diltiazem. Converted on amiodarone.  -chadsvasc 3, continue apixaban, refilled today as she has not been able to get at the [place] -she is on amiodarone 200 mg daily currently. Would continue amiodarone for 3 mos total and then trial stopping -continue metoprolol tartrate 25 mg BID   Hypertension: home medications were held while admitted as she was hypotensive.  -note intolerances to amlodipine, nifedipine, and lisinopril -was on carvedilol 6.25 mg BID, losartan 25 mg  daily, spironolactone-HCTZ 25-25 daily. -restarting spironolactone-HCTZ today -watching salt, doesn't eat pork or red meat -asking her to check home BP  Bilateral LE edema: mild, nonpitting -discussed compression, elevation -restarting spironolactone-HCTZ today   History of covid: wonder if incidental positive vs mild case 03/2021 based on mild symptoms, labs at the time  Cardiac risk counseling and prevention recommendations: -recommend heart healthy/Mediterranean diet, with whole grains, fruits, vegetable, fish, lean meats, nuts, and olive oil. Limit salt. -recommend moderate walking, 3-5 times/week for 30-50 minutes each session. Aim for at least 150 minutes.week. Goal should be pace of 3 miles/hours, or walking 1.5 miles in 30 minutes -recommend avoidance of tobacco products. Avoid excess alcohol. -ASCVD risk score: The ASCVD Risk score (Arnett DK, et al., 2019) failed to calculate for the following reasons:   Cannot find a previous HDL lab   Cannot find a previous total cholesterol lab    Plan for follow up:  Buford Dresser, MD, PhD, [facility]    Medication  Adjustments/Labs and Tests Ordered: Current medicines are reviewed at length with the patient today.  Concerns regarding medicines are outlined above.  Orders Placed This Encounter  Procedures   EKG 12-Lead   Meds ordered this encounter  Medications   spironolactone-hydrochlorothiazide (ALDACTAZIDE) 25-25 MG tablet    Sig: Take 1 tablet by mouth daily.    Dispense:  90 tablet    Refill:  3   apixaban (ELIQUIS) 5 MG TABS tablet    Sig: Take 1 tablet (5 mg total) by mouth 2 (two) times daily.    Dispense:  180 tablet    Refill:  3    Patient Instructions  Medication Instructions:  We are going to restart the spironolactone-hydrochlorothiazide 25-25 mg pill today. Please check your blood pressure at least a few times a week at home. Goal is less than 130/80, but if you feel lightheaded or your blood pressure is less than 100 on the top, call me.  *If you need a refill on your cardiac medications before your next appointment, please call your pharmacy*   Lab Work: None ordered today   Testing/Procedures: None ordered today   Follow-Up: At [facility], you and your health needs are our priority.  As part of our continuing mission to provide you with exceptional heart care, we have created designated Provider Care Teams.  These Care Teams include your primary Cardiologist (physician) and Advanced Practice Providers (APPs -  Physician Assistants and Nurse Practitioners) who all work together to provide you with the care you need, when you need it.  We recommend signing up for the patient portal called "MyChart".  Sign up information is provided on this After Visit Summary.  MyChart is used to connect with patients for Virtual Visits (Telemedicine).  Patients are able to view lab/test results, encounter notes, upcoming appointments, etc.  Non-urgent messages can be sent to your provider as well.   To learn more about what you can do with MyChart, go to [email or web address].     Your next appointment:   2 months  The format for your next appointment:   In Person  Provider:   Buford Dresser, MD      Signed, Buford Dresser, MD PhD 04/27/2021 8:49 AM    [place]

## 2021-04-27 NOTE — Patient Instructions (Addendum)
Medication Instructions:  We are going to restart the spironolactone-hydrochlorothiazide 25-25 mg pill today. Please check your blood pressure at least a few times a week at home. Goal is less than 130/80, but if you feel lightheaded or your blood pressure is less than 100 on the top, call me.  *If you need a refill on your cardiac medications before your next appointment, please call your pharmacy*   Lab Work: None ordered today   Testing/Procedures: None ordered today   Follow-Up: At [facility], you and your health needs are our priority.  As part of our continuing mission to provide you with exceptional heart care, we have created designated Provider Care Teams.  These Care Teams include your primary Cardiologist (physician) and Advanced Practice Providers (APPs -  Physician Assistants and Nurse Practitioners) who all work together to provide you with the care you need, when you need it.  We recommend signing up for the patient portal called "MyChart".  Sign up information is provided on this After Visit Summary.  MyChart is used to connect with patients for Virtual Visits (Telemedicine).  Patients are able to view lab/test results, encounter notes, upcoming appointments, etc.  Non-urgent messages can be sent to your provider as well.   To learn more about what you can do with MyChart, go to [email or web address].    Your next appointment:   2 months  The format for your next appointment:   In Person  Provider:   Jodelle Red, MD

## 2021-06-28 ENCOUNTER — Ambulatory Visit (HOSPITAL_BASED_OUTPATIENT_CLINIC_OR_DEPARTMENT_OTHER): Payer: No Typology Code available for payment source | Admitting: Cardiology

## 2021-06-28 NOTE — Progress Notes (Incomplete)
?Cardiology Office Note:   ? ?Date:  06/28/2021  ? ?ID:  Gabriella Nguyen, DOB 1956/03/01, MRN [id number] ? ?PCP:  Reeves Dam, MD  ?Cardiologist:  Buford Dresser, MD ? ?Referring MD: Reeves Dam, MD  ? ?CC: post hospital follow up ? ?History of Present Illness:   ? ?Gabriella Nguyen is a 66 y.o. female with a hx of hypertension who is seen for follow up today. I met her during her hospitalization 03/2021 for atrial fibrillation with RVR. ? ?She was discharged on 03/31/21. Hospital course and medication reconciliation done today. Since discharge, has overall been doing well. She notes that her legs have been swelling more recently, went to the [place] a few days ago and was put on lasix and potassium for three days. She is now off of this. ? ?Has home BP cuff but hasn't checked since being home. ? ?Hasn't felt palpitations since discharge.  ? ?Denies chest pain, shortness of breath at rest or with normal exertion. No PND, orthopnea, LE edema or unexpected weight gain. No syncope or palpitations.  ? ?Today: ?Overall, ? ?She denies any palpitations, chest pain, shortness of breath, or peripheral edema. No lightheadedness, headaches, syncope, orthopnea, or PND. ? ? ?Past Medical History:  ?Diagnosis Date  ? Aspiration pneumonia ([place]) 06/2011 hosp  ? ASTHMA   ? OSTEOARTHRITIS, KNEES, BILATERAL   ? Unspecified essential hypertension   ? ? ?Past Surgical History:  ?Procedure Laterality Date  ? CESAREAN SECTION  1989  ? hand surgery-right Right 1990  ? at base of right thumb  ? JOINT REPLACEMENT    ? Right total knee  10/20/2015  ? done by Dr Eilene Ghazi at the [facility] hospital   ? TONSILLECTOMY    ? at age 32  ? TOTAL KNEE ARTHROPLASTY Left 07/10/2017  ? Procedure: LEFT TOTAL KNEE ARTHROPLASTY;  Surgeon: Gaynelle Arabian, MD;  Location: [facility];  Service: Orthopedics;  Laterality: Left;  ? TOTAL KNEE ARTHROPLASTY WITH REVISION COMPONENTS Right 01/04/2017  ? Procedure: Right knee total knee arthroplasty revision;  Surgeon:  Gaynelle Arabian, MD;  Location: [facility];  Service: Orthopedics;  Laterality: Right;  ? ? ?Current Medications: ?Current Outpatient Medications on File Prior to Visit  ?Medication Sig  ? albuterol (VENTOLIN HFA) 108 (90 Base) MCG/ACT inhaler Inhale 2 puffs into the lungs every 6 (six) hours as needed for wheezing or shortness of breath.  ? amiodarone (PACERONE) 200 MG tablet Take 1 tablet (200 mg) by mouth twice daily for 14 days, followed by 1 tablet (200 mg) once daily daily.  ? apixaban (ELIQUIS) 5 MG TABS tablet Take 1 tablet (5 mg total) by mouth 2 (two) times daily.  ? BIOTIN PO Take 1 tablet by mouth every morning.  ? ferrous sulfate 325 (65 FE) MG tablet Take 325 mg by mouth every Monday, Wednesday, and Friday.  ? fluticasone-salmeterol (ADVAIR) 100-50 MCG/ACT AEPB Inhale 1 puff into the lungs 2 (two) times daily.  ? Magnesium Oxide 420 MG TABS Take 420 mg by mouth every morning.  ? metoprolol tartrate (LOPRESSOR) 25 MG tablet Take 1 tablet (25 mg total) by mouth 2 (two) times daily.  ? Multiple Vitamin (MULTIVITAMIN WITH MINERALS) TABS tablet Take 1 tablet by mouth every morning.  ? spironolactone-hydrochlorothiazide (ALDACTAZIDE) 25-25 MG tablet Take 1 tablet by mouth daily.  ? ?No current facility-administered medications on file prior to visit.  ?  ? ?Allergies:   Amlodipine, Lisinopril, and Nifedipine  ? ?Social History  ? ?Tobacco Use  ?  Smoking status: Former  ?  Years: 2.00  ?  Types: Cigarettes  ?  Start date: 08/23/1984  ?  Quit date: 02/1985  ?  Years since quitting: 36.3  ? Smokeless tobacco: Never  ?Vaping Use  ? Vaping Use: Never used  ?Substance Use Topics  ? Alcohol use: Yes  ?  Alcohol/week: 2.0 standard drinks  ?  Types: 2 Standard drinks or equivalent per week  ?  Comment: beer or wine  ? Drug use: No  ? ? ?Family History: ?family history includes COPD in her father; Dementia in her mother; Heart disease in her father; Hypertension in her mother. ? ?ROS:   ?Please see the history of present  illness.   ? ?Additional pertinent ROS otherwise unremarkable. ? ?EKGs/Labs/Other Studies Reviewed:   ? ?The following studies were reviewed today: ? ?Echo 03/31/21 ?1. Left ventricular ejection fraction, by estimation, is 60 to 65%. The  ?left ventricle has normal function. The left ventricle has no regional  ?wall motion abnormalities. Left ventricular diastolic parameters were  ?normal.  ? 2. Right ventricular systolic function is normal. The right ventricular  ?size is normal.  ? 3. The mitral valve is normal in structure. Mild mitral valve  ?regurgitation.  ? 4. The aortic valve is tricuspid. There is mild calcification of the  ?aortic valve. There is mild thickening of the aortic valve. Aortic valve  ?regurgitation is not visualized.  ? 5. The inferior vena cava is normal in size with greater than 50%  ?respiratory variability, suggesting right atrial pressure of 3 mmHg.  ? ?Comparison(s): No prior Echocardiogram.  ? ?EKG:  EKG is personally reviewed.   ?06/28/2021: EKG was not ordered. ?04/27/21: NSR at 65 bpm ? ?Recent Labs: ?03/28/2021: B Natriuretic Peptide 20.1 ?03/29/2021: TSH 1.156 ?03/31/2021: ALT 25; BUN 5; Creatinine, Ser 0.75; Hemoglobin 12.3; Magnesium 1.6; Platelets 183; Potassium 3.3; Sodium 134  ? ?Recent Lipid Panel ?No results found for: CHOL, TRIG, HDL, CHOLHDL, VLDL, LDLCALC, LDLDIRECT ? ?Physical Exam:   ? ?VS:  There were no vitals taken for this visit.   ? ?Wt Readings from Last 3 Encounters:  ?04/27/21 165 lb 11.2 oz (75.2 kg)  ?03/28/21 155 lb (70.3 kg)  ?06/21/19 173 lb 6.4 oz (78.7 kg)  ?  ?GEN: Well nourished, well developed in no acute distress ?HEENT: Normal, moist mucous membranes ?NECK: No JVD ?CARDIAC: regular rhythm, normal S1 and S2, no rubs or gallops. No murmur. ?VASCULAR: Radial and DP pulses 2+ bilaterally. No carotid bruits ?RESPIRATORY:  Clear to auscultation without rales, wheezing or rhonchi  ?ABDOMEN: Soft, non-tender, non-distended ?MUSCULOSKELETAL:  Ambulates  independently ?SKIN: Warm and dry, ***bilateral mild nonpitting LE edema ?NEUROLOGIC:  Alert and oriented x 3. No focal neuro deficits noted. ?PSYCHIATRIC:  Normal affect   ? ?ASSESSMENT:   ? ?No diagnosis found. ? ?PLAN:   ? ?Atrial fibrillation, new onset 03/28/21, with rapid ventricular response--recently hospitalization ?-did not respond initially to metoprolol, BP dropped with diltiazem. Converted on amiodarone.  ?-chadsvasc 3, continue apixaban, refilled today as she has not been able to get at the [place] ?-she is on amiodarone 200 mg daily currently. Would continue amiodarone for 3 mos total and then trial stopping ?-continue metoprolol tartrate 25 mg BID ?  ?Hypertension: home medications were held while admitted as she was hypotensive.  ?-note intolerances to amlodipine, nifedipine, and lisinopril ?-was on carvedilol 6.25 mg BID, losartan 25 mg daily, spironolactone-HCTZ 25-25 daily. ?-restarting spironolactone-HCTZ today ?-watching salt, doesn't eat pork or red  meat ?-asking her to check home BP ? ?Bilateral LE edema: mild, nonpitting ?-discussed compression, elevation ?-restarting spironolactone-HCTZ today ?  ?History of covid: wonder if incidental positive vs mild case 03/2021 based on mild symptoms, labs at the time ? ?Cardiac risk counseling and prevention recommendations: ?-recommend heart healthy/Mediterranean diet, with whole grains, fruits, vegetable, fish, lean meats, nuts, and olive oil. Limit salt. ?-recommend moderate walking, 3-5 times/week for 30-50 minutes each session. Aim for at least 150 minutes.week. Goal should be pace of 3 miles/hours, or walking 1.5 miles in 30 minutes ?-recommend avoidance of tobacco products. Avoid excess alcohol. ?-ASCVD risk score: ?The ASCVD Risk score (Arnett DK, et al., 2019) failed to calculate for the following reasons: ?  Cannot find a previous HDL lab ?  Cannot find a previous total cholesterol lab   ? ?Plan for follow up:*** months or sooner as  needed. ? ?Buford Dresser, MD, PhD, [facility] ?[place]   ? ?Medication Adjustments/Labs and Tests Ordered: ?Current medicines are reviewed at length with the patient today.  Concerns regarding medicines are outlined

## 2021-08-04 ENCOUNTER — Other Ambulatory Visit (HOSPITAL_COMMUNITY): Payer: Self-pay

## 2021-12-30 ENCOUNTER — Encounter: Payer: Self-pay | Admitting: Pulmonary Disease

## 2021-12-30 ENCOUNTER — Ambulatory Visit (INDEPENDENT_AMBULATORY_CARE_PROVIDER_SITE_OTHER): Payer: No Typology Code available for payment source | Admitting: Pulmonary Disease

## 2021-12-30 VITALS — BP 130/84 | HR 60 | Temp 98.0°F | Ht 72.0 in | Wt 173.8 lb

## 2021-12-30 DIAGNOSIS — R053 Chronic cough: Secondary | ICD-10-CM

## 2021-12-30 LAB — CBC WITH DIFFERENTIAL/PLATELET
Basophils Absolute: 0 10*3/uL (ref 0.0–0.1)
Basophils Relative: 0.8 % (ref 0.0–3.0)
Eosinophils Absolute: 0.3 10*3/uL (ref 0.0–0.7)
Eosinophils Relative: 4.6 % (ref 0.0–5.0)
HCT: 39.8 % (ref 36.0–46.0)
Hemoglobin: 13.4 g/dL (ref 12.0–15.0)
Lymphocytes Relative: 21.1 % (ref 12.0–46.0)
Lymphs Abs: 1.2 10*3/uL (ref 0.7–4.0)
MCHC: 33.7 g/dL (ref 30.0–36.0)
MCV: 90.6 fl (ref 78.0–100.0)
Monocytes Absolute: 0.4 10*3/uL (ref 0.1–1.0)
Monocytes Relative: 7.6 % (ref 3.0–12.0)
Neutro Abs: 3.7 10*3/uL (ref 1.4–7.7)
Neutrophils Relative %: 65.9 % (ref 43.0–77.0)
Platelets: 194 10*3/uL (ref 150.0–400.0)
RBC: 4.39 Mil/uL (ref 3.87–5.11)
RDW: 13.1 % (ref 11.5–15.5)
WBC: 5.7 10*3/uL (ref 4.0–10.5)

## 2021-12-30 NOTE — Patient Instructions (Addendum)
Obtain actual CT films-last CT was December 06, 2021  Schedule for pulmonary function test, can be done on the day of next visit Continue current inhalers  Order cbc IgE  Call us with significant concerns  Follow-up in 4 to 6 weeks

## 2021-12-30 NOTE — Progress Notes (Signed)
Okay so I will miss follow-up with a cough                Gabriella Nguyen    [id number]    09-09-1955  Primary Care Physician:Hicks, Phylliss Bob, MD  Referring Physician: Reeves Dam, MD [place],  [place] [address]  Chief complaint:   Patient being seen for questionable abnormal CT scan  HPI:  Has had a chronic cough Has had a few CTs The last CT on record did reveal mucous plugging, old granuloma  She does have a chronic cough, not productive at present No night sweats, no fevers, good appetite, no weight loss  No history of hemoptysis she is active,  Does have a history of asthma  Reformed smoker quit in the 80s Social smoking, a pack of cigarettes may last 3 to 4 days  Used to use inhalers Last time she used the rescue inhaler was in January when she was being evaluated for A-fib  She was in the service, 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   amiodarone (PACERONE) 200 MG tablet Take 1 tablet (200 mg) by mouth twice daily for 14 days, followed by 1 tablet (200 mg) once daily daily.   apixaban (ELIQUIS) 5 MG TABS tablet Take 1 tablet (5 mg total) by mouth 2 (two) times daily.   BIOTIN PO Take 1 tablet by mouth every morning.   ferrous sulfate 325 (65 FE) MG tablet Take 325 mg by mouth every Monday, Wednesday, and Friday.   fluticasone-salmeterol (ADVAIR) 100-50 MCG/ACT AEPB Inhale 1 puff into the lungs 2 (two) times daily.   Magnesium Oxide 420 MG TABS Take 420 mg by mouth every morning.   Multiple Vitamin (MULTIVITAMIN WITH MINERALS) TABS tablet Take 1 tablet by mouth every morning.   spironolactone-hydrochlorothiazide (ALDACTAZIDE) 25-25 MG  tablet Take 1 tablet by mouth daily.   metoprolol tartrate (LOPRESSOR) 25 MG tablet Take 1 tablet (25 mg total) by mouth 2 (two) times daily.   No facility-administered encounter medications on file as of 12/30/2021.    Allergies as of 12/30/2021 - Review Complete 12/30/2021  Allergen Reaction Noted   Amlodipine Swelling 08/21/2009   Lisinopril Cough 01/28/2002   Nifedipine Swelling and Other (See Comments) 08/23/2012    Past Medical History:  Diagnosis Date   Aspiration pneumonia ([place]) 06/2011 hosp   ASTHMA    OSTEOARTHRITIS, KNEES, BILATERAL    Unspecified essential hypertension     Past Surgical History:  Procedure Laterality Date   CESAREAN SECTION  1989   hand surgery-right Right 1990   at base of right thumb   JOINT REPLACEMENT     PROSTATECTOMY     Right total knee  10/20/2015   done by Dr Eilene Ghazi at the [facility] hospital    TONSILLECTOMY     at age 34   TOTAL KNEE ARTHROPLASTY Left 07/10/2017   Procedure: LEFT TOTAL KNEE ARTHROPLASTY;  Surgeon: Gaynelle Arabian, MD;  Location: [facility];  Service: Orthopedics;  Laterality: Left;   TOTAL KNEE ARTHROPLASTY WITH REVISION COMPONENTS Right 01/04/2017   Procedure: Right knee total knee arthroplasty revision;  Surgeon: Gaynelle Arabian, MD;  Location: [facility];  Service: Orthopedics;  Laterality: Right;    Family History  Problem Relation Age of Onset   Dementia Mother    Hypertension Mother    Heart disease Father    COPD Father     Social History   Socioeconomic History   Marital status: Single    Spouse name: Not on file   Number of children: 1   Years of education: 14   Highest education level: Not on file  Occupational History   Occupation: Recruitment consultant    Employer: Personal assistant  Tobacco Use   Smoking status: Former    Years: 2.00    Types: Cigarettes    Start date: 08/23/1984    Quit date: 02/1985    Years since quitting: 36.8   Smokeless tobacco: Never  Vaping Use   Vaping Use: Never used   Substance and Sexual Activity   Alcohol use: Yes    Alcohol/week: 2.0 standard drinks of alcohol    Types: 2 Standard drinks or equivalent per week    Comment: beer or wine   Drug use: No   Sexual activity: Not on file  Other Topics Concern   Not on file  Social History Narrative   HSG, 2 years college. Married '85 -divorced '95. Son '89. Work - [employer]. Lives with son and SO.   Fun: Lacinda Axon, travel ([place] ideal).    Denies any religious beliefs effecting health care.    Social Determinants of Health   Financial Resource Strain: Not on file  Food Insecurity: Not on file  Transportation Needs: Not on file  Physical Activity: Not on file  Stress: Not on file  Social Connections: Not on file  Intimate Partner Violence: Not on file    Review of Systems  Constitutional:  Negative for fatigue.  Respiratory:  Positive for cough.     Vitals:   12/30/21 1313  BP: 134/84  Pulse: 60  Temp: 98 F (36.7 C)  SpO2: 98%     Physical Exam Constitutional:      Appearance: Normal appearance.  HENT:     Head: Normocephalic.     Mouth/Throat:     Mouth: Mucous membranes are moist.  Cardiovascular:     Rate and Rhythm: Normal rate and regular rhythm.     Heart sounds: No murmur heard.    No friction rub.  Pulmonary:     Effort: No respiratory distress.     Breath sounds: No stridor. No wheezing or rhonchi.  Musculoskeletal:     Cervical back: No rigidity or tenderness.  Neurological:     Mental Status: She is alert.  Psychiatric:        Mood and Affect: Mood normal.    Data Reviewed: Report from care everywhere reviewed Had a CT scan December 06, 2021 showing mucous plugging, granulomatous changes, no significant adenopathy Similar findings were present December 2022 but resolved May 2023 and then recurred  PFT from 2012 reviewed showing reversible airway obstruction  Assessment:  Chronic cough  Abnormal CT scan of the chest  History of asthma  History of atrial  fibrillation  Possibility of MAI infection was discussed with the patient based on CT scan findings    Plan/Recommendations: We will request for actual CT films to be reviewed by myself  Schedule patient for pulmonary function test  Sputum evaluation plus or minus bronchoscopy discussed with the patient  Encouraged to continue using inhalers as needed  Graded activities as tolerated  Will be  important to review the actual films  Obtain CBC and IgE levels  Follow-up in 4 to 6 weeks  Sherrilyn Rist MD [facility] 12/30/2021, 1:16 PM  CC: Reeves Dam, MD

## 2021-12-30 NOTE — Addendum Note (Signed)
Addended by: Gavin Potters R on: 12/30/2021 03:15 PM   Modules accepted: Orders

## 2021-12-31 LAB — IGE: IgE (Immunoglobulin E), Serum: 776 kU/L — ABNORMAL HIGH (ref <=114)

## 2022-05-09 ENCOUNTER — Ambulatory Visit (INDEPENDENT_AMBULATORY_CARE_PROVIDER_SITE_OTHER): Payer: Medicare PPO | Admitting: Cardiology

## 2022-05-09 ENCOUNTER — Encounter (HOSPITAL_BASED_OUTPATIENT_CLINIC_OR_DEPARTMENT_OTHER): Payer: Self-pay | Admitting: Cardiology

## 2022-05-09 VITALS — BP 130/78 | HR 68 | Ht 72.0 in | Wt 191.0 lb

## 2022-05-09 DIAGNOSIS — Z7901 Long term (current) use of anticoagulants: Secondary | ICD-10-CM | POA: Diagnosis not present

## 2022-05-09 DIAGNOSIS — I480 Paroxysmal atrial fibrillation: Secondary | ICD-10-CM | POA: Diagnosis not present

## 2022-05-09 DIAGNOSIS — D6869 Other thrombophilia: Secondary | ICD-10-CM | POA: Diagnosis not present

## 2022-05-09 DIAGNOSIS — Z7189 Other specified counseling: Secondary | ICD-10-CM

## 2022-05-09 DIAGNOSIS — I10 Essential (primary) hypertension: Secondary | ICD-10-CM

## 2022-05-09 NOTE — Progress Notes (Signed)
Cardiology Office Note:    Date:  05/09/2022   ID:  Gabriella, Nguyen Jul 08, 1955, MRN [id number]  PCP:  Gabriella Dam, MD  Cardiologist:  Gabriella Dresser, MD  Referring MD: Gabriella Dam, MD   CC: follow up  History of Present Illness:    Gabriella Nguyen is a 67 y.o. female with a hx of hypertension, paroxysmal atrial fibrillation who is seen for follow up today. I met her during her hospitalization 03/2021 for atrial fibrillation with RVR.  Today: Had blood drawn at the [place], recommended to start atorvastatin 20 mg daily. She has the medication but has not started it yet. We discussed statins at length today. She is very cautious with her diet, no red meat/pork, mostly poultry and fish. Drinks a lot of water. Eats fruit, minimal caffeine. Tries to keep processed food to a minimum. Uses minimal salt, watches amounts. We discussed the Mediterranean diet today. She stays active with walking, walking indoors when the weather is cold.  Has BP cuff at home but doesn't check frequently.   Denies chest pain, shortness of breath at rest or with normal exertion. No PND, orthopnea, LE edema or unexpected weight gain. No syncope or palpitations.   ROS positive for anxiety.  Past Medical History:  Diagnosis Date   Aspiration pneumonia ([place]) 06/2011 hosp   ASTHMA    OSTEOARTHRITIS, KNEES, BILATERAL    Unspecified essential hypertension     Past Surgical History:  Procedure Laterality Date   CESAREAN SECTION  1989   hand surgery-right Right 1990   at base of right thumb   JOINT REPLACEMENT     PROSTATECTOMY     Right total knee  10/20/2015   done by Dr Gabriella Nguyen at the [facility] hospital    TONSILLECTOMY     at age 13   [place] Left 07/10/2017   Procedure: LEFT TOTAL KNEE ARTHROPLASTY;  Surgeon: Gabriella Arabian, MD;  Location: [facility];  Service: Orthopedics;  Laterality: Left;   TOTAL KNEE ARTHROPLASTY WITH REVISION COMPONENTS Right 01/04/2017   Procedure:  Right knee total knee arthroplasty revision;  Surgeon: Gabriella Arabian, MD;  Location: [facility];  Service: Orthopedics;  Laterality: Right;    Current Medications: Current Outpatient Medications on File Prior to Visit  Medication Sig   albuterol (VENTOLIN HFA) 108 (90 Base) MCG/ACT inhaler Inhale 2 puffs into the lungs every 6 (six) hours as needed for wheezing or shortness of breath.   apixaban (ELIQUIS) 5 MG TABS tablet Take 1 tablet (5 mg total) by mouth 2 (two) times daily.   atorvastatin (LIPITOR) 20 MG tablet Take 20 mg by mouth daily.   BIOTIN PO Take 1 tablet by mouth every morning.   ferrous sulfate 325 (65 FE) MG tablet Take 325 mg by mouth every Monday, Wednesday, and Friday.   fluticasone-salmeterol (ADVAIR) 100-50 MCG/ACT AEPB Inhale 1 puff into the lungs 2 (two) times daily.   Magnesium Oxide 420 MG TABS Take 420 mg by mouth every morning.   Multiple Vitamin (MULTIVITAMIN WITH MINERALS) TABS tablet Take 1 tablet by mouth every morning.   spironolactone-hydrochlorothiazide (ALDACTAZIDE) 25-25 MG tablet Take 1 tablet by mouth daily.   metoprolol tartrate (LOPRESSOR) 25 MG tablet Take 1 tablet (25 mg total) by mouth 2 (two) times daily.   No current facility-administered medications on file prior to visit.     Allergies:   Amlodipine, Lisinopril, and Nifedipine   Social History   Tobacco Use   Smoking status:  Former    Years: 2.00    Types: Cigarettes    Start date: 08/23/1984    Quit date: 02/1985    Years since quitting: 37.2   Smokeless tobacco: Never  Vaping Use   Vaping Use: Never used  Substance Use Topics   Alcohol use: Yes    Alcohol/week: 2.0 standard drinks of alcohol    Types: 2 Standard drinks or equivalent per week    Comment: beer or wine   Drug use: No    Family History: family history includes COPD in her father; Dementia in her mother; Heart disease in her father; Hypertension in her mother.  ROS:   Please see the history of present illness.   Additional pertinent ROS otherwise unremarkable.  EKGs/Labs/Other Studies Reviewed:    The following studies were reviewed today: Echo 03/31/21 1. Left ventricular ejection fraction, by estimation, is 60 to 65%. The  left ventricle has normal function. The left ventricle has no regional  wall motion abnormalities. Left ventricular diastolic parameters were  normal.   2. Right ventricular systolic function is normal. The right ventricular  size is normal.   3. The mitral valve is normal in structure. Mild mitral valve  regurgitation.   4. The aortic valve is tricuspid. There is mild calcification of the  aortic valve. There is mild thickening of the aortic valve. Aortic valve  regurgitation is not visualized.   5. The inferior vena cava is normal in size with greater than 50%  respiratory variability, suggesting right atrial pressure of 3 mmHg.   Comparison(s): No prior Echocardiogram.   EKG:  EKG is personally reviewed.   05/09/22: NSR at 68 bpm 04/27/21 NSR at 65 bpm  Recent Labs: 12/30/2021: Hemoglobin 13.4; Platelets 194.0  Recent Lipid Panel No results found for: "CHOL", "TRIG", "HDL", "CHOLHDL", "VLDL", "LDLCALC", "LDLDIRECT"  Physical Exam:    VS:  BP 130/78   Pulse 68   Ht 6' (1.829 m)   Wt 191 lb (86.6 kg)   BMI 25.90 kg/m     Wt Readings from Last 3 Encounters:  05/09/22 191 lb (86.6 kg)  12/30/21 173 lb 12.8 oz (78.8 kg)  04/27/21 165 lb 11.2 oz (75.2 kg)    GEN: Well nourished, well developed in no acute distress HEENT: Normal, moist mucous membranes NECK: No JVD CARDIAC: regular rhythm, normal S1 and S2, no rubs or gallops. No murmur. VASCULAR: Radial and DP pulses 2+ bilaterally. No carotid bruits RESPIRATORY:  Clear to auscultation without rales, wheezing or rhonchi  ABDOMEN: Soft, non-tender, non-distended MUSCULOSKELETAL:  Ambulates independently SKIN: Warm and dry, no edema NEUROLOGIC:  Alert and oriented x 3. No focal neuro deficits  noted. PSYCHIATRIC:  Normal affect    ASSESSMENT:    1. Paroxysmal atrial fibrillation (HCC)   2. Essential hypertension   3. Secondary hypercoagulable state ([place])   4. Long term current use of anticoagulant   5. Cardiac risk counseling   6. Counseling on health promotion and disease prevention    PLAN:    Atrial fibrillation, new onset 03/2021 -did not respond initially to metoprolol, BP dropped with diltiazem. Converted on amiodarone.  -no recent symptoms -chadsvasc 3, continue apixaban for secondary hypercoagulable state -she is off amiodarone now and maintaining sinus rhythm -continue metoprolol tartrate 25 mg BID   Hypertension:  -note intolerances to amlodipine, nifedipine, and lisinopril -watching salt, doesn't eat pork or red meat -continue spironolactone-HCTZ, metoprolol 25 mg BID  Cardiac risk counseling and prevention recommendations: -recommend heart healthy/Mediterranean  diet, with whole grains, fruits, vegetable, fish, lean meats, nuts, and olive oil. Limit salt. -recommend moderate walking, 3-5 times/week for 30-50 minutes each session. Aim for at least 150 minutes.week. Goal should be pace of 3 miles/hours, or walking 1.5 miles in 30 minutes -recommend avoidance of tobacco products. Avoid excess alcohol. -ASCVD risk score: The ASCVD Risk score (Arnett DK, et al., 2019) failed to calculate for the following reasons:   Cannot find a previous total cholesterol lab    Plan for follow up: 1 year or sooner as needed  Total time of encounter: 45 minutes total time of encounter, including 2 minutes spent in face-to-face patient care. This time includes coordination of care and counseling regarding above conditions. Remainder of non-face-to-face time involved reviewing chart documents/testing relevant to the patient encounter and documentation in the medical record.  Gabriella Dresser, MD, PhD, [facility]    Medication Adjustments/Labs and  Tests Ordered: Current medicines are reviewed at length with the patient today.  Concerns regarding medicines are outlined above.  Orders Placed This Encounter  Procedures   EKG 12-Lead   No orders of the defined types were placed in this encounter.   Patient Instructions  Medication Instructions:  The current medical regimen is effective;  continue present plan and medications.   *If you need a refill on your cardiac medications before your next appointment, please call your pharmacy*   Lab Work: None  Testing/Procedures: None   Follow-Up: At [facility], you and your health needs are our priority.  As part of our continuing mission to provide you with exceptional heart care, we have created designated Provider Care Teams.  These Care Teams include your primary Cardiologist (physician) and Advanced Practice Providers (APPs -  Physician Assistants and Nurse Practitioners) who all work together to provide you with the care you need, when you need it.  We recommend signing up for the patient portal called "MyChart".  Sign up information is provided on this After Visit Summary.  MyChart is used to connect with patients for Virtual Visits (Telemedicine).  Patients are able to view lab/test results, encounter notes, upcoming appointments, etc.  Non-urgent messages can be sent to your provider as well.   To learn more about what you can do with MyChart, go to [email or web address].    Your next appointment:   1 year(s)  Provider:   Buford Dresser, MD    Other Instructions  See diet instructions attached.    Signed, Gabriella Dresser, MD PhD 05/09/2022 9:44 AM    [place]

## 2022-05-09 NOTE — Patient Instructions (Signed)
Medication Instructions:  The current medical regimen is effective;  continue present plan and medications.   *If you need a refill on your cardiac medications before your next appointment, please call your pharmacy*   Lab Work: None  Testing/Procedures: None   Follow-Up: At [facility], you and your health needs are our priority.  As part of our continuing mission to provide you with exceptional heart care, we have created designated Provider Care Teams.  These Care Teams include your primary Cardiologist (physician) and Advanced Practice Providers (APPs -  Physician Assistants and Nurse Practitioners) who all work together to provide you with the care you need, when you need it.  We recommend signing up for the patient portal called "MyChart".  Sign up information is provided on this After Visit Summary.  MyChart is used to connect with patients for Virtual Visits (Telemedicine).  Patients are able to view lab/test results, encounter notes, upcoming appointments, etc.  Non-urgent messages can be sent to your provider as well.   To learn more about what you can do with MyChart, go to [email or web address].    Your next appointment:   1 year(s)  Provider:   Buford Dresser, MD    Other Instructions  See diet instructions attached.

## 2022-07-28 IMAGING — DX DG CHEST 1V
1 series · 1 of 1 positions shown · non-contrast
Comparison: Chest x-ray 09/20/2011.

CLINICAL DATA: COVID, AFib, anxiety.

EXAM:
CHEST  1 VIEW

[chest]
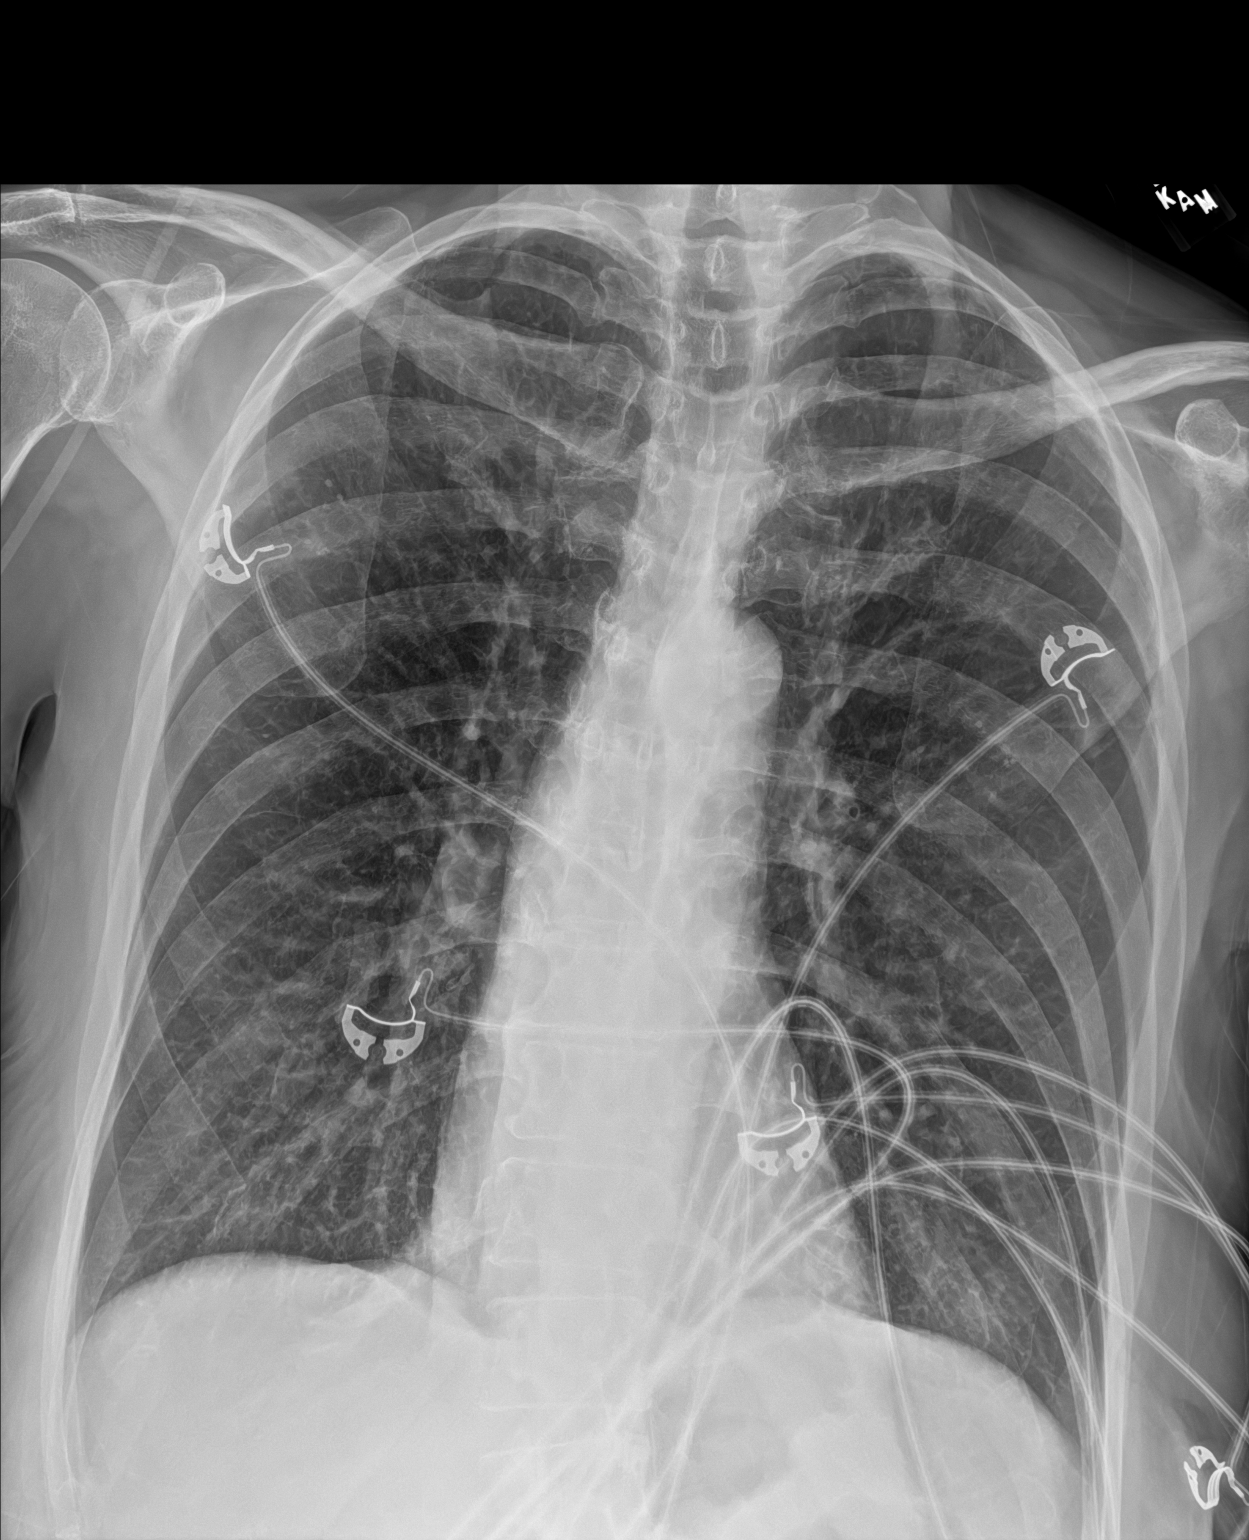

[1 of 1 positions shown; findings below may reference images not displayed]

FINDINGS: The heart size and mediastinal contours are within normal limits.
Both lungs are clear. The visualized skeletal structures are
unremarkable.
IMPRESSION: No active disease.

## 2023-01-31 ENCOUNTER — Emergency Department (HOSPITAL_BASED_OUTPATIENT_CLINIC_OR_DEPARTMENT_OTHER)
Admission: EM | Admit: 2023-01-31 | Discharge: 2023-01-31 | Disposition: A | Discharge status: Home / Self Care | Payer: No Typology Code available for payment source | Attending: Emergency Medicine | Admitting: Emergency Medicine

## 2023-01-31 ENCOUNTER — Encounter (HOSPITAL_BASED_OUTPATIENT_CLINIC_OR_DEPARTMENT_OTHER): Payer: Self-pay | Admitting: Emergency Medicine

## 2023-01-31 DIAGNOSIS — Z7901 Long term (current) use of anticoagulants: Secondary | ICD-10-CM | POA: Insufficient documentation

## 2023-01-31 DIAGNOSIS — I4891 Unspecified atrial fibrillation: Secondary | ICD-10-CM | POA: Insufficient documentation

## 2023-01-31 DIAGNOSIS — R002 Palpitations: Secondary | ICD-10-CM | POA: Diagnosis present

## 2023-01-31 DIAGNOSIS — Z79899 Other long term (current) drug therapy: Secondary | ICD-10-CM | POA: Insufficient documentation

## 2023-01-31 LAB — COMPREHENSIVE METABOLIC PANEL
ALT: 9 U/L (ref 0–44)
AST: 16 U/L (ref 15–41)
Albumin: 3.7 g/dL (ref 3.5–5.0)
Alkaline Phosphatase: 72 U/L (ref 38–126)
Anion gap: 5 (ref 5–15)
BUN: 22 mg/dL (ref 8–23)
CO2: 28 mmol/L (ref 22–32)
Calcium: 9.7 mg/dL (ref 8.9–10.3)
Chloride: 101 mmol/L (ref 98–111)
Creatinine, Ser: 0.98 mg/dL (ref 0.44–1.00)
GFR, Estimated: >60 mL/min (ref >60)
Glucose, Bld: 102 mg/dL — ABNORMAL HIGH (ref 70–99)
Potassium: 3.6 mmol/L (ref 3.5–5.1)
Sodium: 134 mmol/L — ABNORMAL LOW (ref 135–145)
Total Bilirubin: 0.5 mg/dL (ref <1.2)
Total Protein: 6.5 g/dL (ref 6.5–8.1)

## 2023-01-31 LAB — CBC WITH DIFFERENTIAL/PLATELET
Abs Immature Granulocytes: 0.02 10*3/uL (ref 0.00–0.07)
Basophils Absolute: 0 10*3/uL (ref 0.0–0.1)
Basophils Relative: 1 %
Eosinophils Absolute: 0.2 10*3/uL (ref 0.0–0.5)
Eosinophils Relative: 2 %
HCT: 37.8 % (ref 36.0–46.0)
Hemoglobin: 12.7 g/dL (ref 12.0–15.0)
Immature Granulocytes: 0 %
Lymphocytes Relative: 17 %
Lymphs Abs: 1.3 10*3/uL (ref 0.7–4.0)
MCH: 30.8 pg (ref 26.0–34.0)
MCHC: 33.6 g/dL (ref 30.0–36.0)
MCV: 91.7 fL (ref 80.0–100.0)
Monocytes Absolute: 0.6 10*3/uL (ref 0.1–1.0)
Monocytes Relative: 8 %
Neutro Abs: 5.5 10*3/uL (ref 1.7–7.7)
Neutrophils Relative %: 72 %
Platelets: 204 10*3/uL (ref 150–400)
RBC: 4.12 MIL/uL (ref 3.87–5.11)
RDW: 12.1 % (ref 11.5–15.5)
WBC: 7.7 10*3/uL (ref 4.0–10.5)
nRBC: 0 % (ref 0.0–0.2)

## 2023-01-31 LAB — MAGNESIUM: Magnesium: 1.9 mg/dL (ref 1.7–2.4)

## 2023-01-31 NOTE — Discharge Instructions (Signed)
Follow up with your cardiologist for recheck of symptoms tonight.   Return to the ED as needed

## 2023-01-31 NOTE — ED Provider Notes (Signed)
[place] EMERGENCY DEPARTMENT AT [facility] Provider Note   CSN: [id number] Arrival date & time: 01/31/23  2115     History  Chief Complaint  Patient presents with   Irregular Heart Beat    Gabriella Nguyen is a 67 y.o. female.  Patient with a history of atrial fibrillation to ED after having symptoms around 7:00 pm of heart racing, palpitations. No chest pain, SOB. She states if felt like a-fib she has experienced before. Symptoms started to subside after a short period. She is on Metoprolol and took her dose earlier than usual, but reports her symptoms were improving prior to taking it.   The history is provided by the patient and a relative. No language interpreter was used.       Home Medications Prior to Admission medications   Medication Sig Start Date End Date Taking? Authorizing Provider  fexofenadine (ALLEGRA) 180 MG tablet Take 180 mg by mouth daily.   Yes [provider]  potassium chloride SA (KLOR-CON M) 20 MEQ tablet Take 20 mEq by mouth 2 (two) times daily.   Yes [provider]  sertraline (ZOLOFT) 50 MG tablet Take 50 mg by mouth daily.   Yes [provider]  traZODone (DESYREL) 50 MG tablet Take 50 mg by mouth at bedtime.   Yes [provider]  albuterol (VENTOLIN HFA) 108 (90 Base) MCG/ACT inhaler Inhale 2 puffs into the lungs every 6 (six) hours as needed for wheezing or shortness of breath. 11/20/18   Olive Bass, FNP  apixaban (ELIQUIS) 5 MG TABS tablet Take 1 tablet (5 mg total) by mouth 2 (two) times daily. 04/27/21   Jodelle Red, MD  atorvastatin (LIPITOR) 20 MG tablet Take 20 mg by mouth daily.    [provider]  BIOTIN PO Take 1 tablet by mouth every morning.    [provider]  ferrous sulfate 325 (65 FE) MG tablet Take 325 mg by mouth every Monday, Wednesday, and Friday.    [provider]  fluticasone-salmeterol (ADVAIR) 100-50 MCG/ACT AEPB Inhale 1 puff  into the lungs 2 (two) times daily.    [provider]  Magnesium Oxide 420 MG TABS Take 420 mg by mouth every morning.    [provider]  metoprolol tartrate (LOPRESSOR) 25 MG tablet Take 1 tablet (25 mg total) by mouth 2 (two) times daily. Patient taking differently: Take 50 mg by mouth 2 (two) times daily. 03/31/21 04/30/21  Lorin Glass, MD  Multiple Vitamin (MULTIVITAMIN WITH MINERALS) TABS tablet Take 1 tablet by mouth every morning.    [provider]  spironolactone-hydrochlorothiazide (ALDACTAZIDE) 25-25 MG tablet Take 1 tablet by mouth daily. 04/27/21   Jodelle Red, MD      Allergies    Amlodipine, Lisinopril, and Nifedipine    Review of Systems   Review of Systems  Physical Exam Updated Vital Signs BP 129/85   Pulse 74   Temp 98.9 F (37.2 C) (Oral)   Resp 17   SpO2 99%  Physical Exam Vitals and nursing note reviewed.  Constitutional:      Appearance: Normal appearance.  HENT:     Mouth/Throat:     Mouth: Mucous membranes are moist.  Eyes:     Extraocular Movements: Extraocular movements intact.  Cardiovascular:     Rate and Rhythm: Normal rate and regular rhythm.     Heart sounds: No murmur heard. Pulmonary:     Effort: Pulmonary effort is normal.     Breath  sounds: No wheezing, rhonchi or rales.  Chest:     Chest wall: No tenderness.  Abdominal:     General: There is no distension.     Palpations: Abdomen is soft.     Tenderness: There is no abdominal tenderness.  Musculoskeletal:        General: Normal range of motion.     Cervical back: Normal range of motion and neck supple.     Right lower leg: No edema.     Left lower leg: No edema.  Skin:    General: Skin is dry.  Neurological:     Mental Status: She is oriented to person, place, and time.     ED Results / Procedures / Treatments   Labs (all labs ordered are listed, but only abnormal results are displayed) Labs Reviewed  COMPREHENSIVE METABOLIC PANEL -  Abnormal; Notable for the following components:      Result Value   Sodium 134 (*)    Glucose, Bld 102 (*)    All other components within normal limits  CBC WITH DIFFERENTIAL/PLATELET  MAGNESIUM    EKG EKG Interpretation Date/Time:  Tuesday January 31 2023 21:26:23 EST Ventricular Rate:  74 PR Interval:  193 QRS Duration:  102 QT Interval:  400 QTC Calculation: 444 R Axis:   64  Text Interpretation: Sinus rhythm Consider left atrial enlargement Probable anteroseptal infarct, old Confirmed by Vonita Moss [phone]) on 01/31/2023 9:29:01 PM  Radiology No results found.  Procedures Procedures    Medications Ordered in ED Medications - No data to display  ED Course/ Medical Decision Making/ A&P Clinical Course as of 01/31/23 2305  Tue Jan 31, 2023  2303 Patient to ED after having palpitations earlier that resolved and have not recurred. No chest pain, SOb. She appears comfortable. EKG NSR rate 70's. No electrolyte abnormalities. Normal magnesium. She can be discharged home with cardiology follow up.  [SU]    Clinical Course User Index [SU] Elpidio Anis, PA-C                                 Medical Decision Making Amount and/or Complexity of Data Reviewed Labs: ordered.           Final Clinical Impression(s) / ED Diagnoses Final diagnoses:  Palpitations    Rx / DC Orders ED Discharge Orders     None         Danne Harbor 01/31/23 2305    Rondel Baton, MD 02/01/23 1018

## 2023-01-31 NOTE — ED Notes (Signed)
Patient presents to ED reporting that she felt like she was in afib prior to coming for 20-30 minutes. Patient states her "heart was racing" while she was walking around her house. Patient report history of afib and takes eliquis. Patient denies any chest pain, shortness of breath, N/V, or weakness when symptoms began and denies symptoms currently.

## 2023-01-31 NOTE — ED Triage Notes (Signed)
Reports having AFIb at home about 2 hours ago.  Felt heart racing. No chest pain, no sob
